# Patient Record
Sex: Male | Born: 1990 | Race: Black or African American | Hispanic: No | Smoking: Current every day smoker
Health system: Southern US, Community
[De-identification: ages and names within clinical notes are randomized; demographics above are authoritative.]

## PROBLEM LIST (undated history)

## (undated) DIAGNOSIS — F32A Depression, unspecified: Secondary | ICD-10-CM

## (undated) DIAGNOSIS — F900 Attention-deficit hyperactivity disorder, predominantly inattentive type: Secondary | ICD-10-CM

## (undated) DIAGNOSIS — F17200 Nicotine dependence, unspecified, uncomplicated: Secondary | ICD-10-CM

## (undated) DIAGNOSIS — F419 Anxiety disorder, unspecified: Secondary | ICD-10-CM

## (undated) DIAGNOSIS — F3181 Bipolar II disorder: Secondary | ICD-10-CM

## (undated) HISTORY — DX: Depression, unspecified: F32.A

## (undated) HISTORY — DX: Anxiety disorder, unspecified: F41.9

## (undated) HISTORY — DX: Attention-deficit hyperactivity disorder, predominantly inattentive type: F90.0

## (undated) HISTORY — PX: WISDOM TOOTH EXTRACTION: SHX21

## (undated) HISTORY — DX: Nicotine dependence, unspecified, uncomplicated: F17.200

---

## 2016-04-03 ENCOUNTER — Emergency Department
Admission: EM | Admit: 2016-04-03 | Discharge: 2016-04-03 | Disposition: A | Discharge status: Home / Self Care | Payer: PRIVATE HEALTH INSURANCE | Attending: Emergency Medicine | Admitting: Emergency Medicine

## 2016-04-03 ENCOUNTER — Telehealth (INDEPENDENT_AMBULATORY_CARE_PROVIDER_SITE_OTHER): Payer: PRIVATE HEALTH INSURANCE | Admitting: Psychiatric - Mental Health Nurse Practitioner (Across the Lifespan)

## 2016-04-03 ENCOUNTER — Emergency Department: Payer: PRIVATE HEALTH INSURANCE

## 2016-04-03 DIAGNOSIS — F418 Other specified anxiety disorders: Secondary | ICD-10-CM

## 2016-04-03 DIAGNOSIS — F419 Anxiety disorder, unspecified: Secondary | ICD-10-CM | POA: Insufficient documentation

## 2016-04-03 LAB — MAGNESIUM: Magnesium: 2.1 mg/dL (ref 1.6–2.6)

## 2016-04-03 LAB — COMPREHENSIVE METABOLIC PANEL
ALT: 13 U/L (ref 0–55)
AST (SGOT): 16 U/L (ref 5–34)
Albumin/Globulin Ratio: 1.4 (ref 0.9–2.2)
Albumin: 4.2 g/dL (ref 3.5–5.0)
Alkaline Phosphatase: 54 U/L (ref 38–106)
Anion Gap: 12 (ref 5.0–15.0)
BUN: 10.1 mg/dL (ref 9.0–28.0)
Bilirubin, Total: 1 mg/dL (ref 0.2–1.2)
CO2: 25 mEq/L (ref 22–29)
Calcium: 9.6 mg/dL (ref 8.5–10.5)
Chloride: 106 mEq/L (ref 100–111)
Creatinine: 1.2 mg/dL (ref 0.7–1.3)
Globulin: 3 g/dL (ref 2.0–3.6)
Glucose: 83 mg/dL (ref 70–100)
Potassium: 3.9 mEq/L (ref 3.5–5.1)
Protein, Total: 7.2 g/dL (ref 6.0–8.3)
Sodium: 143 mEq/L (ref 136–145)

## 2016-04-03 LAB — RAPID DRUG SCREEN, URINE
Barbiturate Screen, UR: NEGATIVE
Benzodiazepine Screen, UR: NEGATIVE
Cannabinoid Screen, UR: NEGATIVE
Cocaine, UR: NEGATIVE
Opiate Screen, UR: NEGATIVE
PCP Screen, UR: NEGATIVE
Urine Amphetamine Screen: NEGATIVE

## 2016-04-03 LAB — SALICYLATE LEVEL: Salicylate Level: <5 mg/dL — ABNORMAL LOW (ref 15.0–30.0)

## 2016-04-03 LAB — CBC
Absolute NRBC: 0 10*3/uL
Hematocrit: 46.9 % (ref 42.0–52.0)
Hgb: 16.2 g/dL (ref 13.0–17.0)
MCH: 28.1 pg (ref 28.0–32.0)
MCHC: 34.5 g/dL (ref 32.0–36.0)
MCV: 81.4 fL (ref 80.0–100.0)
MPV: 9.9 fL (ref 9.4–12.3)
Nucleated RBC: 0 /100 WBC (ref 0.0–1.0)
Platelets: 241 10*3/uL (ref 140–400)
RBC: 5.76 10*6/uL (ref 4.70–6.00)
RDW: 13 % (ref 12–15)
WBC: 10.21 10*3/uL (ref 3.50–10.80)

## 2016-04-03 LAB — ETHANOL: Alcohol: NOT DETECTED mg/dL

## 2016-04-03 LAB — ACETAMINOPHEN LEVEL: Acetaminophen Level: <7 ug/mL — ABNORMAL LOW (ref 10–30)

## 2016-04-03 LAB — TSH: TSH: 0.49 u[IU]/mL (ref 0.35–4.94)

## 2016-04-03 LAB — PHOSPHORUS: Phosphorus: 2.5 mg/dL (ref 2.3–4.7)

## 2016-04-03 LAB — GFR: EGFR: >60

## 2016-04-03 MED ORDER — SODIUM CHLORIDE 0.9 % IV BOLUS
1000.0000 mL | Freq: Once | INTRAVENOUS | Status: AC
Start: 2016-04-03 — End: 2016-04-03
  Administered 2016-04-03: 1000 mL via INTRAVENOUS

## 2016-04-03 MED ORDER — ONDANSETRON HCL 4 MG/2ML IJ SOLN
4.0000 mg | Freq: Once | INTRAMUSCULAR | Status: AC
Start: 2016-04-03 — End: 2016-04-03
  Administered 2016-04-03: 4 mg via INTRAVENOUS
  Filled 2016-04-03: qty 2

## 2016-04-03 NOTE — ED Provider Notes (Signed)
Physician/Midlevel provider first contact with patient: 04/03/16 1410       EMERGENCY DEPARTMENT HISTORY AND PHYSICAL EXAM    Physician/Midlevel provider first contact with patient: 04/03/2016 2:10 PM     Date Time: 04/03/2016 2:10 PM  Patient Name: [facility]    History of Presenting Illness:     Chief Complaint: Anxiety attacks  History obtained from: Patient.  Onset/Duration: 7-8 days  Severity: Moderate  Aggravating Factors: Denies any recent stress  Alleviating Factors: States was placed on Lexapro about a week ago by his psychiatrist for depression  Associated Symptoms: Low appetite, nausea  Narrative/Additional Historical Findings: Billy Hunter is a 25 y.o. male  history of anxiety and depression.  States has been having anxiety attacks for the past week, that have been stronger and lasting longer than before.  Patient denies any suicide or homicide ideation.  Patient is interested in hospitalization for these anxiety attacks.     Nursing notes from this date of service were reviewed.    Past Medical History:   History reviewed. No pertinent past medical history.    Past Surgical History:   History reviewed. No pertinent past surgical history.    Family History:   No family history on file.    Social History:     Social History     Social History   . Marital Status: Single     Spouse Name: N/A   . Number of Children: N/A   . Years of Education: N/A     Social History Main Topics   . Smoking status: Never Smoker    . Smokeless tobacco: Not on file   . Alcohol Use: No   . Drug Use: Not on file   . Sexual Activity: Not on file     Other Topics Concern   . Not on file     Social History Narrative   . No narrative on file       Allergies:   No Known Allergies    Medications:     Current facility-administered medications:   .  ondansetron (ZOFRAN) injection 4 mg, 4 mg, Intravenous, Once, Kym Scannell M, [place]  .  sodium chloride 0.9 % bolus 1,000 mL, 1,000 mL, Intravenous, Once, Cassandria Santee [place],  [place]    Current outpatient prescriptions:   .  escitalopram (LEXAPRO) 10 MG tablet, Take 10 mg by mouth daily., Disp: , Rfl:     Review of Systems:   Constitutional: No fever.  Eyes: No vision changes.  ENT: No sore throat  Cardiovascular: no chest pain  Respiratory: No cough.  GI: No vomiting or diarrhea.  Genitourinary: no dysuria  Musculoskeletal: No extremity pain or decreased use  Skin: no rash or skin lesions.  Neurologic: Normal level of alertness  Psychiatric:  No anxiety Now    Physical Exam:   BP 128/80 mmHg  Pulse 70  Temp(Src) 98.7 F (37.1 C) (Oral)  Resp 14  Ht 5\' 7"  (1.702 m)  Wt 63.504 kg  BMI 21.92 kg/m2  SpO2 98%    Constitutional: Vital signs reviewed. Well hydrated, appears in NAD  Head:  Normocephalic, atraumatic  Eyes: No conjunctival injection. No discharge.  ENT: Mucous membranes dry  Neck: Normal range of motion. Non-tender.  Respiratory/Chest: Clear to auscultation. No respiratory distress.   Cardiovascular: Regular rate and rhythm. No murmur.   Abdomen: Soft and non-tender. No masses or hepatosplenomegaly.  Genitourinary:  UpperExtremity: No edema or cyanosis.  Moving well.  LowerExtremity: No edema or cyanosis.  Moving well.  Neurological: No focal motor deficits by observation. Speech normal. Memory normal.  Skin: Warm and dry. No rash.  Lymphatic: No cervical lymphadenopathy.  Psychiatric: Normal affect. Normal concentration. Interaction with adults is appropriate for age.    Labs:     Results     Procedure Component Value Units Date/Time    TSH [[id number]] Collected:  04/03/16 1430    Specimen Information:  Blood Updated:  04/03/16 1523     Thyroid Stimulating Hormone 0.49 uIU/mL     ASA  level [[id number]]  (Abnormal) Collected:  04/03/16 1430    Specimen Information:  Blood Updated:  04/03/16 1504     Salicylate Level <5.0 (L) mg/dL     GFR [[id number]] Collected:  04/03/16 1430     EGFR >60.0 Updated:  04/03/16 1504    Comprehensive metabolic panel (CMP) [[id number]] Collected:   04/03/16 1430    Specimen Information:  Blood Updated:  04/03/16 1504     Glucose 83 mg/dL      BUN 62.9 mg/dL      Creatinine 1.2 mg/dL      Sodium 528 mEq/L      Potassium 3.9 mEq/L      Chloride 106 mEq/L      CO2 25 mEq/L      Calcium 9.6 mg/dL      Protein, Total 7.2 g/dL      Albumin 4.2 g/dL      AST (SGOT) 16 U/L      ALT 13 U/L      Alkaline Phosphatase 54 U/L      Bilirubin, Total 1.0 mg/dL      Globulin 3.0 g/dL      Albumin/Globulin Ratio 1.4      Anion Gap 12.0     Magnesium [[id number]] Collected:  04/03/16 1430    Specimen Information:  Blood Updated:  04/03/16 1504     Magnesium 2.1 mg/dL     Phosphorus [[id number]] Collected:  04/03/16 1430    Specimen Information:  Blood Updated:  04/03/16 1504     Phosphorus 2.5 mg/dL     Alcohol (Ethanol)  Level [[id number]] Collected:  04/03/16 1430    Specimen Information:  Blood Updated:  04/03/16 1504     Alcohol None Detected mg/dL     Acetaminophen level [[id number]]  (Abnormal) Collected:  04/03/16 1430    Specimen Information:  Blood Updated:  04/03/16 1504     Acetaminophen Level <7 (L) ug/mL     Urine Rapid Drug Screen [[id number]] Collected:  04/03/16 1430    Specimen Information:  Urine Updated:  04/03/16 1457     Amphetamine Screen, UR Negative      Barbiturate Screen, UR Negative      Benzodiazepine Screen, UR Negative      Cannabinoid Screen, UR Negative      Cocaine, UR Negative      Opiate Screen, UR Negative      PCP Screen, UR Negative     CBC without differential [[id number]] Collected:  04/03/16 1430    Specimen Information:  Blood from Blood Updated:  04/03/16 1437     WBC 10.21 x10 3/uL      Hgb 16.2 g/dL      Hematocrit 09.3 %      Platelets 241 x10 3/uL      RBC 5.76 x10 6/uL      MCV 81.4 fL      MCH 28.1 pg      MCHC 34.5 g/dL  RDW 13 %      MPV 9.9 fL      Nucleated RBC 0.0 /100 WBC      Absolute NRBC 0.00 x10 3/uL             Rads:     Radiology Results (24 Hour)     ** No results found for the last 24 hours. **          MDM and ED  Course   I, Lenor Derrick PA-C, have been the primary provider for Billy Hunter during this Emergency Dept visit.  Oxygen saturation by pulse oximetry is 95%-100%, Normal.  Interventions: None Needed.  The attending signature signifies review and agreement of the history, physical examination, evaluation, clinical impression, and plan except as noted.     DDX  Anxiety, depression, suicide or homicide ideation  Patient seen by [facility], recommended partial hospitalization program for [facility] patients. Patient comfortable with plan. Patient declined any medications. Work note given. Patient stable for discharge, no anxiety attack here.     Assessment/Plan:   Results and instructions reviewed at the bedside with patient.    Clinical Impression  Final diagnoses:   Anxiety       Disposition  ED Disposition     Discharge Billy Hunter discharge to home/self care.    Condition at disposition: Stable            Prescriptions  Discharge Medication List as of 04/03/2016  4:12 PM          Signed by: Otto Herb, PA-C            Lenor Derrick [place], [place]  04/03/16 1940    Leticia Clas, MD  04/04/16 [phone]

## 2016-04-03 NOTE — Progress Notes (Signed)
Psychiatric Evaluation Part I    Billy Hunter is a 25 y.o. male admitted to the  [facility] Emergency Department who was seen via Telepsych on 04/03/2016 by Lamount Cohen, NP.    Call Details  Patient Location: [place]  Time contacted by ED Physician: 1420  Time consult began: 1425  Time (in minutes) from Call to Consult: 5  Time consult concluded: 1445  Referring ED Department  Emergency Department: [facility]        Discharge Planning  Living Arrangements:  (roomates)  Support Systems: Friends/neighbors  Type of Residence: Private residence  Patient expects to be discharged to:: to be determined     Presenting Mental Status  Orientation Level: Oriented X4  Memory: No Impairment  Thought Content: normal  Thought Process: normal  Behavior: normal  Consciousness: Alert  Impulse Control: normal  Perception: normal  Eye Contact: normal  Attitude: cooperative  Mood: normal  Hopelessness Affects Goals: Yes  Hopelessness About Future: Yes  Affect: normal  Speech: normal  Concentration: normal  Insight: good  Judgment: good  Appearance: normal  Appetite: decreased  Weight change?: decreased  Weight Loss (Pounds): unk  Energy: decreased  Sleep: hypersomnia  Reliability of Reporter/Patient: good    Tool for Assessment of Suicide Risk  Individual Risk Profile: male, age 29-30, chronic mental illness  Symptom Risk Profile: anxiety, depressive symptoms  Protective Factors: employed, commitment to occupation, internalized teachings against suicide, trait optimism, safety plan, abstinent from substances  Level of Suicide Risk: Low    Within the Last 6 Months:: no history of violence toward self  Greater than 6 Months Ago:: history of violence toward self (as a kid in HS punched a wall )                    Substance Recovery Support  Recovery Resources/Support: occasional ETOH use                    Violence Toward Others  Within the Last 6 Months:: no history of violence toward others  Greater than 6 Months Ago:: no  history of violence toward others              Summary: 25 year old male presents to ED reports panic attacks x1 wk reports last 1.5 hrs daily, ends with him vomiting. Reports unable to handle anxiety attacks that he feels like he is unable to get a grip. Discussed re centering himself when panic attacks occur by touch- touching surroundings , and try walking, taking a shower, hobbies- discussed hobbies.    Hobbies-reports none other than working and sleeping.   Arrival- had roommate dropped him off   Stressors- Last relationship 1 month ago got out of a month relationship prior single for 1 year  Medical hx- denies     Psych hx - reports suffers from depression and anxiety past 4 to 5 yrs recently prescribed Lexapro 10 mg for the past week, reports anxiety became worse past week. Seen by Dr. Ardyth Harps psychiatrists x1 at [place]. Denies past attempts or SIB. Reports SI in the past, denies plan.       ETOH- occasional 2 to 3 drinks per week, perhaps a beer or shot of liquor. Denies addiction hx. Denies withdrawal sxs. Reports blacked out 3 yrs ago, had higher drinking habit, was drinking anything going out with friends was 21 .  THC, occasional  Caffeine- reports consumes seldom,  Coffee usually gets him wired, and other  times makes him sleep.  Trauma hx- denies  Social hx- born in [place] and raised in [place]. Parents divorced at age 69.his mom worked for the [employer], pts dad worked for Nurse, children's.Pt has 1 sister. Highest level of education some college. Reports growing up he wasn't the best student and wasn't the best at making friends. 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  Reports does not have a big social circle.     Disposition:  reviewed options pt declined admission. Pt not detainable. 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 Coping skills sheets faxed over.                  Lamount Cohen, NP    [facility]  [address]  [place], [place] [address]  [phone]

## 2016-04-03 NOTE — Discharge Instructions (Signed)
Anxiety, Panic     You have been diagnosed with an anxiety attack.     You seem to have had an anxiety attack. There are many conditions that can cause symptoms like these. If this is the first time this has happened, follow-up with your regular doctor. You may need more testing to be sure there isn’t another cause for your symptoms.     Anxiety causes very strong feelings of worry and fear. It may also cause chest pain or shortness of breath. You may feel like you have palpitations (a racing heart). You might feel numbness (like parts of your body are "asleep"), especially around the mouth and in the hands or feet.     Follow up with your counselor and family doctor. If you do not have an appointment in the next 2-3 days, call and make one. It is VERY IMPORTANT for your counselor and family doctor to know if you get worse.     YOU SHOULD SEEK MEDICAL ATTENTION IMMEDIATELY, EITHER HERE OR AT THE NEAREST EMERGENCY DEPARTMENT, IF ANY OF THE FOLLOWING OCCURS:  · You have symptoms that you normally don’t have with your anxiety attacks.  · You think of harming yourself (suicidal thoughts) or harming someone else.  · You have symptoms you normally don’t have and they last longer than normal or your medicine doesn't help. These include chest pain, passing out, feeling that your heart is racing or shortness of breath.  · You have a fever (temperature higher than 100.4ºF / 38ºC).

## 2016-04-03 NOTE — ED Notes (Signed)
Patient reports anxiety attacks which he has been having every morning for the past week. States he has had no new stressors in his life, does have hx of anxiety/depression. Reports he wakes up in the morning and has panic attacks lasting >30 minutes at times.  Denies SI/HI Patient states he was recently put on Lexapro for anxiety/depression. States he was having panic attacks prior to starting lexapro, but reports these have increased.     Patient has been questioned about the presence of weapons so that these items may be recovered and secured appropriately. Denies the current possession of any weapons.

## 2016-10-17 ENCOUNTER — Encounter (INDEPENDENT_AMBULATORY_CARE_PROVIDER_SITE_OTHER): Payer: Self-pay | Admitting: Family

## 2016-10-17 ENCOUNTER — Ambulatory Visit (INDEPENDENT_AMBULATORY_CARE_PROVIDER_SITE_OTHER): Payer: Commercial Managed Care - POS | Admitting: Family

## 2016-10-17 VITALS — BP 120/77 | HR 83 | Temp 99.3°F | Resp 20 | Ht 67.0 in | Wt 153.0 lb

## 2016-10-17 DIAGNOSIS — R52 Pain, unspecified: Secondary | ICD-10-CM

## 2016-10-17 DIAGNOSIS — B349 Viral infection, unspecified: Secondary | ICD-10-CM

## 2016-10-17 DIAGNOSIS — R110 Nausea: Secondary | ICD-10-CM

## 2016-10-17 LAB — POCT INFLUENZA A/B
POCT Rapid Influenza A AG: NEGATIVE
POCT Rapid Influenza B AG: NEGATIVE

## 2016-10-17 MED ORDER — ONDANSETRON 4 MG PO TBDP
4.0000 mg | ORAL_TABLET | Freq: Three times a day (TID) | ORAL | 0 refills | Status: DC | PRN
Start: 2016-10-17 — End: 2017-08-01

## 2016-10-17 NOTE — Patient Instructions (Signed)
Viral Syndrome (Adult)  A viral illness may cause a number of symptoms. The symptoms depend on the part of the body that the virus affects. If it settles in your nose, throat, and lungs, it may cause cough, sore throat, congestion, and sometimes headache. If it settles in your stomach and intestinal tract, it may cause vomiting and diarrhea. Sometimes it causes vague symptoms like "aching all over," feeling tired, loss of appetite, or fever.  A viral illness usually lasts 1 to 2 weeks, but sometimes it lasts longer. In some cases, a more serious infection can look like a viral syndrome in the first few days of the illness. You may need another exam and additional tests to know the difference. Watch for the warning signs listed below.  Home care  Follow these guidelines for taking care of yourself at home:   If symptoms are severe, rest at home for the first 2 to 3 days.   Stay away from cigarette smoke - both your smoke and the smoke from others.   You may use over-the-counteracetaminophen or ibuprofen for fever, muscle aching, and headache, unless another medicine was prescribed for this. If you have chronic liver or kidney disease or ever had a stomach ulcer or GI bleeding, talk with your doctor before using these medicines. No one who is younger than 18 and ill with a fever should take aspirin. It may cause severe disease or death.   Your appetite may be poor, so a light diet is fine. Avoid dehydration by drinking 8 to 12 8-ounce glasses of fluids each day. This may include water; orange juice; lemonade; apple, grape, and cranberry juice; clear fruit drinks; electrolyte replacement and sports drinks; and decaffeinated teas and coffee. If you have been diagnosed with a kidney disease, ask your doctor how much and what types of fluids you should drink to prevent dehydration. If you have kidney disease, drinking too much fluid can cause it build up in the your body and be dangerous to your  health.   Over-the-counter remedies won't shorten the length of the illness but may be helpful for cough, sore throat; and nasal and sinus congestion. Don't use decongestants if you have high blood pressure.  Follow-up care  Follow up with your healthcare provider if you do not improve over the next week.  Call 911  Get emergency medical care if any of the following occur:   Convulsion   Feeling weak, dizzy, or like you are going to faint   Chest pain, shortness of breath, wheezing, or difficulty breathing  When to seek medical advice  Call your healthcare provider right away if any of these occur:   Cough with lots of colored sputum (mucus) or blood in your sputum   Chest pain, shortness of breath, wheezing, or difficulty breathing   Severe headache; face, neck, or ear pain   Severe, constant pain in the lower right side of your belly (abdominal)   Continued vomiting (can't keep liquids down)   Frequent diarrhea (more than 5 times a day); blood (red or black color) or mucus in diarrhea   Feeling weak, dizzy, or like you are going to faint   Extreme thirst   Fever of 100.4F (38C) or higher, or as directed by your healthcare provider  Date Last Reviewed: 05/30/2014   2000-2016 [employer] [address], [place] [address]. All rights reserved. This information is not intended as a substitute for professional medical care. Always follow your healthcare   professional's instructions.

## 2016-10-17 NOTE — Progress Notes (Signed)
[facility]    PROGRESS NOTE      Patient: Billy Hunter   Date: 10/17/2016   MRN: [id number]     History reviewed. No pertinent past medical history.  Social History     Social History   . Marital status: Single     Spouse name: N/A   . Number of children: N/A   . Years of education: N/A     Occupational History   . Not on file.     Social History Main Topics   . Smoking status: Never Smoker   . Smokeless tobacco: Never Used   . Alcohol use No   . Drug use: No   . Sexual activity: Not on file     Other Topics Concern   . Not on file     Social History Narrative   . No narrative on file     History reviewed. No pertinent family history.    ASSESSMENT/PLAN     Billy Hunter is a 26 y.o. male    Chief Complaint   Patient presents with   . URI     body aches, headaches since saturday. nauseated        1. Viral illness    2. Body aches  - POCT Influenza A/B    3. Nausea  - ondansetron (ZOFRAN ODT) 4 MG disintegrating tablet; Take 1 tablet (4 mg total) by mouth every 8 (eight) hours as needed for Nausea.  Dispense: 12 tablet; Refill: 0         Results for orders placed or performed in visit on 10/17/16   POCT Influenza A/B   Result Value Ref Range    POCT QC Pass     POCT Rapid Influenza A AG Negative Negative    POCT Rapid Influenza B AG Negative Negative     It is likely that your symptoms are of viral nature at this time. Your congestion can best be helped by OTC decongestants such as sudafed to help open up nasal passageways and minimize congestion and secretions. Start OTC cough suppressant such as delsym or robitussin to decrease cough symptoms. Increase vitamin intake for immune support. If feeling nauseous, my try zofran, usage and side effects reviewed with pt. Increase your fluid intake with water or electrolyte beverages (gatorade, pedialyte). The ingestion of warm liquids may help loosen respiratory secretions, thereby enhancing their removal. May start saline nasal spray or nasal drops  may be used to cleanse the sinuses if desired. A humidfier or warm shower may help losen secretions. Practice good hand hygiene. Get plenty of rest. Symptoms may last for another 7 -10 days, and may get worse before they get better; pt was advised of this and verbalized understanding. RTC if symptoms worsening or persist. Patient is agreeable to plan, has no further questions at this time.    MEDICATIONS     Current Outpatient Prescriptions   Medication Sig Dispense Refill   . buPROPion (WELLBUTRIN) 100 MG tablet Take 100 mg by mouth 2 (two) times daily.     [name] gabapentin (NEURONTIN) 300 MG capsule Take 200 mg by mouth 3 (three) times daily.     . ondansetron (ZOFRAN ODT) 4 MG disintegrating tablet Take 1 tablet (4 mg total) by mouth every 8 (eight) hours as needed for Nausea. 12 tablet 0     No current facility-administered medications for this visit.        No Known Allergies    SUBJECTIVE  Chief Complaint   Patient presents with   . URI     body aches, headaches since saturday. nauseated        URI    This is a new problem. Episode onset: 2-3 days. The problem has been gradually worsening. Maximum temperature: low grade, feeling on and off. Associated symptoms include congestion (nasal), coughing (wet  in the morning, mild infrequent), headaches (frontal, wraps around his head, started saturday evening) and nausea (started on saturday, on and off). Pertinent negatives include no abdominal pain, diarrhea, ear pain, neck pain, plugged ear sensation, rash, rhinorrhea, sinus pain, sneezing, sore throat, swollen glands, vomiting or wheezing. Associated symptoms comments: Body aches. Treatments tried: dayquil yesterday. The treatment provided no relief.   hx of severe depression and states it is hard to differentiate when he is sick or when it's due to depression.     ROS     Review of Systems   Constitutional: Positive for diaphoresis (woke up sunday ) and fever (99.2 max temp). Negative for appetite change, chills  and fatigue.   HENT: Positive for congestion (nasal) and sinus pressure (fronatal, and maxillary). Negative for ear pain, postnasal drip, rhinorrhea, sinus pain, sneezing, sore throat, tinnitus, trouble swallowing and voice change.    Eyes: Negative.    Respiratory: Positive for cough (wet  in the morning, mild infrequent). Negative for apnea, chest tightness, shortness of breath and wheezing.    Cardiovascular: Negative.    Gastrointestinal: Positive for constipation (BM daily, but hard ) and nausea (started on saturday, on and off). Negative for abdominal pain, diarrhea and vomiting.   Musculoskeletal: Positive for myalgias. Negative for neck pain and neck stiffness.   Skin: Negative for rash.   Neurological: Positive for headaches (frontal, wraps around his head, started saturday evening). Negative for dizziness, tremors, facial asymmetry, weakness and light-headedness.       The following portions of the patient's history were reviewed and updated as appropriate: Allergies, Current Medications, Past Family History, Past Medical history, Past social history, Past surgical history, and Problem List.    PHYSICAL EXAM     Vitals:    10/17/16 1603   BP: 120/77   BP Site: Left arm   Patient Position: Sitting   Cuff Size: Medium   Pulse: 83   Resp: 20   Temp: 99.3 F (37.4 C)   TempSrc: Tympanic   SpO2: 98%   Weight: 69.4 kg (153 lb)   Height: 1.702 m (5\' 7" )       Physical Exam   Constitutional: He is oriented to person, place, and time. He appears well-developed and well-nourished. No distress.   HENT:   Head: Normocephalic and atraumatic.   Right Ear: External ear normal.   Left Ear: External ear normal.   Nose: Nose normal.   Mouth/Throat: Oropharynx is clear and moist. No oropharyngeal exudate.   bilat TM normal   Eyes: Conjunctivae and EOM are normal. Pupils are equal, round, and reactive to light. Right eye exhibits no discharge. Left eye exhibits no discharge. No scleral icterus.   Neck: Normal range of  motion. Neck supple.   Cardiovascular: Normal rate, regular rhythm, normal heart sounds and intact distal pulses.  Exam reveals no gallop and no friction rub.    No murmur heard.  Pulmonary/Chest: Effort normal and breath sounds normal. No respiratory distress. He has no wheezes. He has no rales. He exhibits no tenderness.   Good air exchange in all fields   Lymphadenopathy:  He has no cervical adenopathy.   Neurological: He is alert and oriented to person, place, and time.   Skin: Skin is warm and dry. No rash noted. He is not diaphoretic. No erythema. No pallor.   Psychiatric: He has a normal mood and affect. His behavior is normal.     Ortho Exam  Neurologic Exam     Mental Status   Oriented to person, place, and time.     Cranial Nerves     CN III, IV, VI   Pupils are equal, round, and reactive to light.  Extraocular motions are normal.       PROCEDURE(S)     Procedures        Signed,  Ronaldo Miyamoto, FNP  10/17/2016

## 2016-10-18 ENCOUNTER — Encounter (INDEPENDENT_AMBULATORY_CARE_PROVIDER_SITE_OTHER): Payer: Self-pay | Admitting: Family

## 2016-12-29 ENCOUNTER — Ambulatory Visit (INDEPENDENT_AMBULATORY_CARE_PROVIDER_SITE_OTHER): Payer: Commercial Managed Care - POS | Admitting: Family

## 2016-12-29 ENCOUNTER — Encounter (INDEPENDENT_AMBULATORY_CARE_PROVIDER_SITE_OTHER): Payer: Self-pay | Admitting: Family

## 2016-12-29 VITALS — BP 125/82 | HR 81 | Temp 98.3°F | Resp 16 | Wt 155.0 lb

## 2016-12-29 DIAGNOSIS — R0981 Nasal congestion: Secondary | ICD-10-CM

## 2016-12-29 DIAGNOSIS — R0982 Postnasal drip: Secondary | ICD-10-CM

## 2016-12-29 DIAGNOSIS — J0100 Acute maxillary sinusitis, unspecified: Secondary | ICD-10-CM

## 2016-12-29 MED ORDER — AMOXICILLIN 500 MG PO TABS
500.0000 mg | ORAL_TABLET | Freq: Two times a day (BID) | ORAL | 0 refills | Status: AC
Start: 2016-12-29 — End: 2017-01-08

## 2016-12-29 MED ORDER — FLUTICASONE PROPIONATE 50 MCG/ACT NA SUSP
1.0000 | Freq: Two times a day (BID) | NASAL | 0 refills | Status: DC
Start: 2016-12-29 — End: 2017-08-24

## 2016-12-29 NOTE — Patient Instructions (Signed)
Acute Bacterial Rhinosinusitis (ABRS)    Acute bacterial rhinosinusitis (ABRS) is an infection of your nasal cavity and sinuses. It's caused by bacteria. Acute means that you've had symptoms for less than 4 weeks, but possibly up to 12 weeks.  Understanding your sinuses  The nasal cavity is the large air-filled space behind your nose. The sinuses are a group of spaces formed by the bones of your face. They connect with your nasal cavity. ABRS causes the tissue lining these spaces to become inflamed. Mucus may not drain normally. This leads to facial pain and other symptoms.  What causes ABRS?  ABRS most often follows an upper respiratory infection caused by a virus. Bacteria then infect the lining of your nasal cavity and sinuses. But you can also get ABRS if you have:   Nasal allergies   Long-term nasal swelling and congestion not caused by allergies   Blockage in the nose  Symptoms of ABRS  The symptoms of ABRS may be different for each person and include:   Nasal congestion or blockage   Pain or pressure in the face   Thick, colored drainage from the nose  Other symptoms may include:   Runny nose   Fluid draining from the nose down the throat (postnasal drip)   Headache   Cough   Pain   Fever  Diagnosing ABRS  ABRS may be diagnosed if you've had an upper respiratory infection like a cold and cough for 10 or more days without improvement or with worsening symptoms. Your healthcare provider will ask about your symptoms and your medical history. The provider will check your vital signs, including your temperature. You'll have a physical exam. The healthcare provider will check your ears, nose, and throat. You likely won't need any tests. If ABRS comes back, you may have a culture or other tests.  Treatment for ABRS  Treatment may include:   Antibiotic medicine. This is for symptoms that last for at least 10 to 14 days.   Nasal corticosteroid medicine. Drops or spray used in the nose can lessen  swelling and congestion.   Over-the-counter pain medicine. This is to lessen sinus pain and pressure.   Nasal decongestant medicine. Spray or drops may help to lessen congestion. Do not use them for more than a few days.   Salt wash (saline irrigation). This can help to loosen mucus.  Possible complications of ABRS  ABRS may come back or become long-term (chronic). In rare cases, ABRS may cause complications such as:   Inflamed tissue around the brain and spinal cord (meningitis)   Inflamed tissue around the eyes (orbital cellulitis)   Inflamed bones around the sinuses (osteitis)  These problems may need to be treated in a hospital with intravenous (IV) antibiotic medicine or surgery.  When to call the healthcare provider  Call your healthcare provider if you have any of the following:   Symptoms that don't get better, or get worse   Symptoms that don't get better after 3 to 5 days on antibiotics   Trouble seeing   Swelling around your eyes   Confusion or trouble staying awake   Date Last Reviewed: 01/04/2016   2000-2017 [employer], [place]. [address] [place], [place] [address]. All rights reserved. This information is not intended as a substitute for professional medical care. Always follow your healthcare professional's instructions.      Amoxicillin Trihydrate Oral capsule  What is this medicine?  AMOXICILLIN (a mox i SIL in) is a  penicillin antibiotic. It is used to treat certain kinds of bacterial infections. It will not work for colds, flu, or other viral infections.  This medicine may be used for other purposes; ask your health care provider or pharmacist if you have questions.  What should I tell my health care provider before I take this medicine?  They need to know if you have any of these conditions:   asthma   kidney disease   an unusual or allergic reaction to amoxicillin, other penicillins, cephalosporin antibiotics, other medicines, foods, dyes, or preservatives   pregnant or  trying to get pregnant   breast-feeding  How should I use this medicine?  Take this medicine by mouth with a glass of water. Follow the directions on your prescription label. You may take this medicine with food or on an empty stomach. Take your medicine at regular intervals. Do not take your medicine more often than directed. Take all of your medicine as directed even if you think your are better. Do not skip doses or stop your medicine early.  Talk to your pediatrician regarding the use of this medicine in children. While this drug may be prescribed for selected conditions, precautions do apply.  Overdosage: If you think you have taken too much of this medicine contact a poison control center or emergency room at once.  NOTE: This medicine is only for you. Do not share this medicine with others.  What if I miss a dose?  If you miss a dose, take it as soon as you can. If it is almost time for your next dose, take only that dose. Do not take double or extra doses.  What may interact with this medicine?   amiloride   birth control pills   chloramphenicol   macrolides   probenecid   sulfonamides   tetracyclines  This list may not describe all possible interactions. Give your health care provider a list of all the medicines, herbs, non-prescription drugs, or dietary supplements you use. Also tell them if you smoke, drink alcohol, or use illegal drugs. Some items may interact with your medicine.  What should I watch for while using this medicine?  Tell your doctor or health care professional if your symptoms do not improve in 2 or 3 days. Take all of the doses of your medicine as directed. Do not skip doses or stop your medicine early.  If you are diabetic, you may get a false positive result for sugar in your urine with certain brands of urine tests. Check with your doctor.  Do not treat diarrhea with over-the-counter products. Contact your doctor if you have diarrhea that lasts more than 2 days or if the  diarrhea is severe and watery.  What side effects may I notice from receiving this medicine?  Side effects that you should report to your doctor or health care professional as soon as possible:   allergic reactions like skin rash, itching or hives, swelling of the face, lips, or tongue   breathing problems   dark urine   redness, blistering, peeling or loosening of the skin, including inside the mouth   seizures   severe or watery diarrhea   trouble passing urine or change in the amount of urine   unusual bleeding or bruising   unusually weak or tired   yellowing of the eyes or skin  Side effects that usually do not require medical attention (report to your doctor or health care professional if they continue or are bothersome):  dizziness   headache   stomach upset   trouble sleeping  This list may not describe all possible side effects. Call your doctor for medical advice about side effects. You may report side effects to FDA at 1-800-FDA-1088.  Where should I keep my medicine?  Keep out of the reach of children.  Store between 68 and 77 degrees F (20 and 25 degrees C). Keep bottle closed tightly. Throw away any unused medicine after the expiration date.  NOTE:This sheet is a summary. It may not cover all possible information. If you have questions about this medicine, talk to your doctor, pharmacist, or health care provider. Copyright 2015 [employer]

## 2016-12-29 NOTE — Progress Notes (Signed)
[facility]    PROGRESS NOTE      Patient: Billy Hunter   Date: 12/29/2016   MRN: [id number]     History reviewed. No pertinent past medical history.  Social History     Social History   . Marital status: Single     Spouse name: N/A   . Number of children: N/A   . Years of education: N/A     Occupational History   . Not on file.     Social History Main Topics   . Smoking status: Never Smoker   . Smokeless tobacco: Never Used   . Alcohol use No   . Drug use: No   . Sexual activity: Not on file     Other Topics Concern   . Not on file     Social History Narrative   . No narrative on file     History reviewed. No pertinent family history.    ASSESSMENT/PLAN     Billy Hunter is a 26 y.o. male    Chief Complaint   Patient presents with   . URI        1. Acute maxillary sinusitis, recurrence not specified  - amoxicillin (AMOXIL) 500 MG tablet; Take 1 tablet (500 mg total) by mouth 2 (two) times daily.for 10 days  Dispense: 20 tablet; Refill: 0    2. Nasal congestion  - fluticasone (FLONASE) 50 MCG/ACT nasal spray; 1 spray by Nasal route 2 (two) times daily.  Dispense: 16 g; Refill: 0    3. Post-nasal drip         Will start amoxicillin today, which will treat sinus infection, usage and side effects reviewed. Please finish full course of antibiotics and may take probiotics or yogurt to supplement good gut flora. Advised to use OTC flonase and OTC decongestant for the next one week to help open up nasal passageways and minimize congestion and secretions. Maintaining adequate hydration may help to thin secretions and soothe the respiratory mucosa is advised. May start sinus rinse and/or Saline nasal sprays to cleanse the sinuses if desired.  A humidfier or warm shower may help losen secretions.  Tylenol or Ibuprofen for sinus pressure and pain. RTC if symptoms are not improving. Patient is agreeable to plan, has no further questions at this time.       MEDICATIONS     Current Outpatient Prescriptions    Medication Sig Dispense Refill   . buPROPion (WELLBUTRIN) 100 MG tablet Take 100 mg by mouth 2 (two) times daily.     [name] gabapentin (NEURONTIN) 300 MG capsule Take 200 mg by mouth 3 (three) times daily.     [name] amoxicillin (AMOXIL) 500 MG tablet Take 1 tablet (500 mg total) by mouth 2 (two) times daily.for 10 days 20 tablet 0   . fluticasone (FLONASE) 50 MCG/ACT nasal spray 1 spray by Nasal route 2 (two) times daily. 16 g 0   . ondansetron (ZOFRAN ODT) 4 MG disintegrating tablet Take 1 tablet (4 mg total) by mouth every 8 (eight) hours as needed for Nausea. 12 tablet 0     No current facility-administered medications for this visit.        No Known Allergies    SUBJECTIVE     Chief Complaint   Patient presents with   . URI        Patient came in 10/17/16 for c/o flu-like sx at the time of visit, had a negative flu test done at that time.  Pt states all symptoms have resolved since then, these sx are new.      URI    This is a new problem. Episode onset: 2 weeks ago. The problem has been gradually improving (starting to get better within the past two days ). There has been no fever. Associated symptoms include abdominal pain (he reports "maybe a little bit of an upset stomach" but no pain), congestion (nasal and chest), coughing (slight, mostly dry), joint pain (he reports "overall my body feels sore"), nausea (a little bit, on and off), rhinorrhea, sinus pain, a sore throat (resolved) and vomiting (x1 two weeks ago, sx have resolved). Pertinent negatives include no chest pain, diarrhea, ear pain, headaches, joint swelling, neck pain, plugged ear sensation, rash, sneezing, swollen glands or wheezing. Associated symptoms comments: Postnasal drip. He reports "I feel really lethargic, dizzy and loopy, I feel almost intoxicated on Monday at work because of spinning sensation but resolved" . Treatments tried: dayquil Q4HRs x3 days (since Tuesday) and vitamin C . last dose dayquil yesterday at 7pm.       ROS     Review of  Systems   Constitutional: Negative for appetite change, chills, diaphoresis, fatigue and fever.   HENT: Positive for congestion (nasal and chest), postnasal drip, rhinorrhea, sinus pain, sinus pressure (maxillary) and sore throat (resolved). Negative for ear pain, sneezing, tinnitus, trouble swallowing and voice change.    Eyes: Negative.    Respiratory: Positive for cough (slight, mostly dry). Negative for apnea, choking, chest tightness, shortness of breath and wheezing.    Cardiovascular: Negative for chest pain.   Gastrointestinal: Positive for abdominal pain (he reports "maybe a little bit of an upset stomach" but no pain), nausea (a little bit, on and off) and vomiting (x1 two weeks ago, sx have resolved). Negative for diarrhea.   Genitourinary: Negative.    Musculoskeletal: Positive for joint pain (he reports "overall my body feels sore"). Negative for neck pain.   Skin: Negative for rash.   Allergic/Immunologic: Negative for environmental allergies.   Neurological: Negative for dizziness, facial asymmetry, light-headedness and headaches.       The following portions of the patient's history were reviewed and updated as appropriate: Allergies, Current Medications, Past Family History, Past Medical history, Past social history, Past surgical history, and Problem List.    PHYSICAL EXAM     Vitals:    12/29/16 1029 12/29/16 1049   BP: 125/82    Pulse: 81    Resp: 16    Temp: 99.1 F (37.3 C) 98.3 F (36.8 C)   TempSrc:  Oral   SpO2: 98%    Weight: 70.3 kg (155 lb)        Physical Exam   Constitutional: He is oriented to person, place, and time. He appears well-developed and well-nourished. No distress.   HENT:   Head: Normocephalic and atraumatic.   Right Ear: External ear and ear canal normal. Tympanic membrane is erythematous. Tympanic membrane is not retracted and not bulging. Tympanic membrane mobility is normal. A middle ear effusion is present.   Left Ear: External ear and ear canal normal. Tympanic  membrane is erythematous. Tympanic membrane is not retracted and not bulging. Tympanic membrane mobility is normal. A middle ear effusion is present.   Nose: Mucosal edema and rhinorrhea present. Right sinus exhibits maxillary sinus tenderness. Right sinus exhibits no frontal sinus tenderness. Left sinus exhibits maxillary sinus tenderness. Left sinus exhibits no frontal sinus tenderness.   Mouth/Throat: Uvula is midline and  oropharynx is clear and moist. No uvula swelling. No oropharyngeal exudate, posterior oropharyngeal edema, posterior oropharyngeal erythema or tonsillar abscesses. Tonsils are 0 on the right. Tonsils are 0 on the left. No tonsillar exudate.   clear post nasal drip   Eyes: Conjunctivae and EOM are normal. Pupils are equal, round, and reactive to light. Right eye exhibits no discharge. Left eye exhibits no discharge. No scleral icterus.   Neck: Normal range of motion. Neck supple.   Cardiovascular: Normal rate, regular rhythm, normal heart sounds and intact distal pulses.  Exam reveals no gallop and no friction rub.    No murmur heard.  Pulmonary/Chest: Effort normal and breath sounds normal. No respiratory distress. He has no wheezes. He has no rales. He exhibits no tenderness.   Good air exchange in all fields   Lymphadenopathy:     He has no cervical adenopathy.   Neurological: He is alert and oriented to person, place, and time.   Skin: Skin is warm and dry. Capillary refill takes less than 2 seconds. No rash noted. He is not diaphoretic. No erythema. No pallor.   Psychiatric: He has a normal mood and affect. His behavior is normal.     Ortho Exam  Neurologic Exam     Mental Status   Oriented to person, place, and time.     Cranial Nerves     CN III, IV, VI   Pupils are equal, round, and reactive to light.  Extraocular motions are normal.       PROCEDURE(S)     Procedures        Signed,  Ronaldo Miyamoto, FNP  12/29/2016

## 2017-08-01 ENCOUNTER — Encounter (INDEPENDENT_AMBULATORY_CARE_PROVIDER_SITE_OTHER): Payer: Self-pay

## 2017-08-01 ENCOUNTER — Ambulatory Visit (INDEPENDENT_AMBULATORY_CARE_PROVIDER_SITE_OTHER): Payer: BLUE CROSS/BLUE SHIELD | Admitting: Family

## 2017-08-01 VITALS — BP 116/84 | HR 88 | Temp 98.0°F | Resp 14 | Wt 151.0 lb

## 2017-08-01 DIAGNOSIS — Z113 Encounter for screening for infections with a predominantly sexual mode of transmission: Secondary | ICD-10-CM

## 2017-08-01 DIAGNOSIS — R110 Nausea: Secondary | ICD-10-CM

## 2017-08-01 DIAGNOSIS — R197 Diarrhea, unspecified: Secondary | ICD-10-CM

## 2017-08-01 LAB — HIV AG/AB 4TH GENERATION: HIV Ag/Ab, 4th Generation: NONREACTIVE

## 2017-08-01 LAB — HEPATITIS C ANTIBODY: Hepatitis C, AB: NONREACTIVE

## 2017-08-01 LAB — HEPATITIS B SURFACE ANTIGEN W/ REFLEX TO CONFIRMATION: Hepatitis B Surface Antigen: NONREACTIVE

## 2017-08-01 LAB — HEMOLYSIS INDEX: Hemolysis Index: 3 (ref 0–18)

## 2017-08-01 MED ORDER — ONDANSETRON 4 MG PO TBDP
4.0000 mg | ORAL_TABLET | Freq: Three times a day (TID) | ORAL | 0 refills | Status: DC | PRN
Start: 2017-08-01 — End: 2017-09-08

## 2017-08-01 NOTE — Progress Notes (Signed)
[facility]    PROGRESS NOTE      Patient: Billy Hunter   Date: 08/01/2017   MRN: [id number]     History reviewed. No pertinent past medical history.  Social History     Social History   . Marital status: Single     Spouse name: N/A   . Number of children: N/A   . Years of education: N/A     Occupational History   . Not on file.     Social History Main Topics   . Smoking status: Never Smoker   . Smokeless tobacco: Current User   . Alcohol use No   . Drug use: No   . Sexual activity: Not on file     Other Topics Concern   . Not on file     Social History Narrative   . No narrative on file     History reviewed. No pertinent family history.    ASSESSMENT/PLAN     Billy Hunter is a 26 y.o. male    Chief Complaint   Patient presents with   . Diarrhea        1. Nausea  - ondansetron (ZOFRAN ODT) 4 MG disintegrating tablet; Take 1 tablet (4 mg total) by mouth every 8 (eight) hours as needed for Nausea.  Dispense: 20 tablet; Refill: 0  - recommend staying hydrated    2. Diarrhea, unspecified type  - It is very likely your symptoms are due to viral gastroenteritis.  Wash your hands frequently with soap and hot water.  Avoid handling food while you are ill to prevent the spread of the diarrhea.  Avoid coffee, alcohol, dairy products, fatty, and spicy foods.  Hydrate with clear fluids and electrolye drinks at room temperature such as tea, broth, sports drinks, and ginger ale (without caffeine).  Begin eating a bland diet of toast, saltine crackers, apple sauce, and bananas as tolerated.  Advance diet as tolerated.   May use OTC Pepto bismol or loperimide as needed.  Call or follow up with PCP if there are bloody stools, persistent diarrhea, vomiting, fever or abdominal pain.  Patient agreeable with plan. All questions answered.    The differential diagnosis includes viral gastroenteritis, appendicitis, diverticulitis, antibiotic associated diarrhea, IBS, lactose intolerance, medication side effect,  colitis, celiac disease or IBD.      3. Screening examination for STD (sexually transmitted disease)  - HSV 1/2 Type-Specific IgG  - RPR (Reflex to Titer and Confirmation)  - HIV Ag/Ab 4th generation  - Hepatitis B (HBV) Surface Antigen  - Hepatitis C Antibody    Will notify with results and treat as indicated.            Risk & Benefits of the new medication(s) were explained to the patient (and family) who verbalized understanding & agreed to the treatment plan. Patient (family) encouraged to contact me/clinical staff with any questions/concerns      MEDICATIONS     Current Outpatient Prescriptions   Medication Sig Dispense Refill   . ARIPiprazole (ABILIFY) 5 MG tablet Take 2.5 mg by mouth daily.     . fluticasone (FLONASE) 50 MCG/ACT nasal spray 1 spray by Nasal route 2 (two) times daily. 16 g 0   . gabapentin (NEURONTIN) 100 MG capsule Take 400 mg by mouth 3 (three) times daily.         . ondansetron (ZOFRAN ODT) 4 MG disintegrating tablet Take 1 tablet (4 mg total) by mouth every 8 (eight) hours  as needed for Nausea. 20 tablet 0   . buPROPion (WELLBUTRIN) 100 MG tablet Take 100 mg by mouth 2 (two) times daily.       No current facility-administered medications for this visit.        No Known Allergies    SUBJECTIVE     Chief Complaint   Patient presents with   . Diarrhea        Pt reports having nausea and diarrhea started yesterday. He thought he ate bad food and thought it would go away. However he states this AM he felt worse with nausea and is still having diarrhea. He says he is dealing with depression and unsure if symptoms are related.    Has not been feeling well in general for the past week. Has been lightheaded and having migraines more frequently. Migraines are similar to the ones he normally has.     Recently restarted Gabapentin and started Abilify on Tuesday.       Diarrhea    This is a new problem. The current episode started yesterday. Associated symptoms include bloating, chills (this AM after  BM) and headaches (migraine lasted 5 hours then went away after 2 hours. Otherwise headaches go away after an hour). Pertinent negatives include no abdominal pain, coughing, fever, increased  flatus, myalgias or vomiting. Associated symptoms comments: Migraine, lightheaded sometimes, difficult to eat, feels dehydrated. Treatments tried: aleve for the migraine. depression     He also states he has a new partner and would like STD testing done. He denies a previous history of STDs. He states his new partner does not have any symptoms and he denies having symptoms.   ROS     Review of Systems   Constitutional: Positive for appetite change (decreased, no significant change in the past few days), chills (this AM after BM) and fatigue (in the past few days). Negative for fever.   HENT: Negative for congestion, ear pain and sore throat.         Throat feels dry   Respiratory: Positive for shortness of breath (mild, yesterday). Negative for cough.    Gastrointestinal: Positive for bloating, diarrhea (x 2) and nausea. Negative for abdominal pain, blood in stool, flatus and vomiting.        Has sensation of heartburn and nausea at once. Denies hx of reflux.   Genitourinary: Negative for difficulty urinating, discharge, dysuria, flank pain and genital sores.   Musculoskeletal: Negative for myalgias.   Neurological: Positive for headaches (migraine lasted 5 hours then went away after 2 hours. Otherwise headaches go away after an hour).       The following sections were reviewed this encounter by the provider:   Tobacco  Allergies  Meds  Problems  Med Hx  Surg Hx  Fam Hx  Soc Hx          PHYSICAL EXAM     Vitals:    08/01/17 1116   BP: 116/84   Pulse: 88   Resp: 14   Temp: 98 F (36.7 C)   TempSrc: Tympanic   SpO2: 98%   Weight: 68.5 kg (151 lb)       Physical Exam   Constitutional: He is oriented to person, place, and time. He appears well-developed and well-nourished. No distress.   HENT:   Head: Normocephalic and  atraumatic.   Right Ear: External ear normal.   Left Ear: External ear normal.   Nose: Nose normal.   Mouth/Throat: Uvula is midline, oropharynx is  clear and moist and mucous membranes are normal.   Eyes: Pupils are equal, round, and reactive to light.   Neck: Normal range of motion. Neck supple.   Cardiovascular: Normal rate, regular rhythm and normal heart sounds.  Exam reveals no gallop and no friction rub.    No murmur heard.  Pulmonary/Chest: Effort normal and breath sounds normal. No respiratory distress. He has no decreased breath sounds. He has no wheezes. He has no rhonchi. He has no rales.   Abdominal: Soft. Normal appearance and bowel sounds are normal. There is no tenderness. There is no rigidity, no rebound, no guarding, no CVA tenderness, no tenderness at McBurney's point and negative Murphy's sign.   Lymphadenopathy:     He has no cervical adenopathy.   Neurological: He is alert and oriented to person, place, and time.   Skin: Skin is warm and dry. He is not diaphoretic.   Psychiatric: He has a normal mood and affect. His behavior is normal. Judgment and thought content normal.   Nursing note and vitals reviewed.    Ortho Exam  Neurologic Exam     Mental Status   Oriented to person, place, and time.     Cranial Nerves     CN III, IV, VI   Pupils are equal, round, and reactive to light.      PROCEDURE(S)     Procedures        Signed,  Morene Crocker, FNP  08/01/2017

## 2017-08-01 NOTE — Patient Instructions (Addendum)
Treating Diarrhea    Diarrhea happens when you have loose, watery, or frequent bowel movements. It is a common problem with many causes. Most cases of diarrhea clear up on their own. But certain cases may need treatment. Be sure to see your healthcare providerif your symptoms do not improve within a few days.  Getting relief  Treatment of diarrhea depends on its cause. Diarrhea caused by bacterial or parasite infection is often treated with antibiotics. Diarrhea caused by other factors, such as a stomach virus, often improves with simple home treatment. The tips below may also help relieve your symptoms.   Drink plenty of fluids. This helps prevent too much fluid loss (dehydration). Water, clear soups, and electrolyte solutions are good choices. Avoid alcohol, coffee, tea, and milk. These can irritate your intestines andmake symptoms worse.   Suck on ice chips if drinking makes you queasy.   Return to your normal diet slowly. You may want to eat bland foods at first, such as rice and toast. Also, you may need to avoid certain foods for a while, such as dairy products. These can make symptoms worse. Ask yourhealthcare providerif there are any other foods you should avoid.   If you were prescribed antibiotics, take them as directed.   Do not take anti-diarrhea medicines without asking yourhealthcare providerfirst.  Call your healthcare provider  Call your healthcare provider if you have any of the following:   A fever of 100.4F (38.0C) or higher, or as directed by your healthcare provider   Severe pain   Worsening diarrhea or diarrhea for more than 2 days   Bloody vomit or stool   Signs of dehydration (dizziness, dry mouth and tongue, rapid pulse, dark urine)  Date Last Reviewed: 03/06/2015   2000-2018 [employer] [address], [place] [address]. All rights reserved. This information is not intended as a substitute for professional medical care. Always follow your  healthcare professional's instructions.

## 2017-08-02 LAB — URINE CHLAMYDIA/NEISSERIA BY PCR
Chlamydia DNA by PCR: NEGATIVE
Neisseria gonorrhoeae by PCR: NEGATIVE

## 2017-08-02 LAB — HSV TYPE 1 AND 2 ANTIBODY IGG: HSV 2 IgG Type-Specific Antibody: <0.9 (ref <0.90)

## 2017-08-02 LAB — RPR (REFLEX TO TITER AND CONFIRMATION): RPR: NONREACTIVE

## 2017-08-02 LAB — HSV TYPE 1 AND 2 IGG: HSV 1 IgG Type-Specific AB: <0.9 (ref <0.90)

## 2017-08-24 ENCOUNTER — Emergency Department
Admission: EM | Admit: 2017-08-24 | Discharge: 2017-08-24 | Disposition: A | Discharge status: Home / Self Care | Payer: BLUE CROSS/BLUE SHIELD | Attending: Emergency Medicine | Admitting: Emergency Medicine

## 2017-08-24 ENCOUNTER — Emergency Department: Payer: BLUE CROSS/BLUE SHIELD

## 2017-08-24 DIAGNOSIS — S40022A Contusion of left upper arm, initial encounter: Secondary | ICD-10-CM

## 2017-08-24 DIAGNOSIS — S5012XA Contusion of left forearm, initial encounter: Secondary | ICD-10-CM | POA: Insufficient documentation

## 2017-08-24 DIAGNOSIS — X58XXXA Exposure to other specified factors, initial encounter: Secondary | ICD-10-CM | POA: Insufficient documentation

## 2017-08-24 NOTE — ED Triage Notes (Signed)
Pt c/o of L arm pain that began on Tuesday. Pt went to the gym on Monday is unaware that he injuried himself. Pt describes the pain as a stabbing sensation that has progressively gotten worse. Pt denies any weapons.

## 2017-08-24 NOTE — Discharge Instructions (Signed)
Contusion    You have been diagnosed with a contusion.    A contusion is a bruise. A contusion occurs when something strikes or hits the body. This breaks small blood vessels called capillaries. When the capillaries break, blood leaks out. This makes the skin look red, purple, blue, or black. The injured area may hurt for a few days. If you take a blood thinner like warfarin (Coumadin) the bruising may be worse.    Apply ice to the bruise. Avoid using the injured body part.    Apply ice to help with pain and swelling. Put some ice cubes in a re-sealable plastic bag (like Ziploc). Add some water. Seal the bag. Put a thin washcloth between the bag and the skin. Apply the ice bag for at least 20 minutes. Do this at least 4 times per day. It's okay to apply ice longer or more often. NEVER APPLY ICE DIRECTLY TO THE SKIN. Always keep a washcloth between the ice pack and your body.    YOU SHOULD SEEK MEDICAL ATTENTION IMMEDIATELY, EITHER HERE OR AT THE NEAREST EMERGENCY DEPARTMENT, IF ANY OF THE FOLLOWING OCCURS:   Your pain or swelling gets much worse.   You develop new numbness or tingling in or below the affected area.   Your foot or hand looks cold or pale. This could mean there is a problem with circulation (blood supply).

## 2017-08-24 NOTE — ED Provider Notes (Signed)
Physician/Midlevel provider first contact with patient: 08/24/17 1548         History     Chief Complaint   Patient presents with   . Arm Pain     Pt presents c/o sharp pain to mid left forearm for the past 2 days.  Pt states he did work out 3 days ago but did not recall injury.  Pt states he gets a sharp pain when he tries to grip or lift items.  No redness or swelling.        The history is provided by the patient. No language interpreter was used.   Arm Pain   This is a new problem. The current episode started in the past 7 days. The problem occurs constantly. The problem has been unchanged. Pertinent negatives include no chills, fever, nausea, neck pain, numbness, rash, vomiting or weakness. He has tried nothing for the symptoms.            History reviewed. No pertinent past medical history.    History reviewed. No pertinent surgical history.    History reviewed. No pertinent family history.    Social  Social History   Substance Use Topics   . Smoking status: Never Smoker   . Smokeless tobacco: Current User   . Alcohol use No       .     No Known Allergies    Home Medications     Med List Status:  In Progress Set By: Lenetta Quaker, RN at 08/24/2017  1:49 PM                ARIPiprazole (ABILIFY) 5 MG tablet     Take 2.5 mg by mouth daily.     gabapentin (NEURONTIN) 100 MG capsule     Take 400 mg by mouth 3 (three) times daily.         ondansetron (ZOFRAN ODT) 4 MG disintegrating tablet     Take 1 tablet (4 mg total) by mouth every 8 (eight) hours as needed for Nausea.                               Review of Systems   Constitutional: Negative for chills and fever.   Gastrointestinal: Negative for nausea and vomiting.   Genitourinary: Negative for dysuria and hematuria.   Musculoskeletal: Negative for back pain and neck pain.   Skin: Negative for color change, pallor, rash and wound.   Neurological: Negative for weakness and numbness.   Hematological: Negative for adenopathy. Does not bruise/bleed easily.    Psychiatric/Behavioral: Negative for agitation and confusion.       Physical Exam    BP: 121/78, Heart Rate: 74, Temp: 98.7 F (37.1 C), Resp Rate: 16, SpO2: 99 %, Weight: 68 kg    Physical Exam   Constitutional: He is oriented to person, place, and time. He appears well-developed and well-nourished. No distress.   HENT:   Head: Normocephalic and atraumatic.   Right Ear: External ear normal.   Left Ear: External ear normal.   Nose: Nose normal.   Mouth/Throat: Oropharynx is clear and moist. No oropharyngeal exudate.   Eyes: Pupils are equal, round, and reactive to light. Conjunctivae are normal. Right eye exhibits no discharge. Left eye exhibits no discharge. No scleral icterus.   Neck: Normal range of motion. Neck supple.   Pulmonary/Chest: Effort normal. No respiratory distress.   Musculoskeletal: Normal range of motion.  Left elbow: Normal.        Left wrist: Normal.        Left forearm: He exhibits tenderness and bony tenderness. He exhibits no swelling, no edema, no deformity and no laceration.        Arms:       Left hand: Normal.   Neurological: He is alert and oriented to person, place, and time.   Skin: Skin is warm and dry. Capillary refill takes less than 2 seconds. He is not diaphoretic.   Psychiatric: He has a normal mood and affect. His behavior is normal. Judgment and thought content normal.   Nursing note and vitals reviewed.        MDM and ED Course     ED Medication Orders     None             MDM  Number of Diagnoses or Management Options  Arm contusion, left, initial encounter:   Diagnosis management comments: [name] , PA-C, have been the primary provider for Billy Hunter during this Emergency Dept visit. The attending signature signifies review and agreement of the history, physical exam, evaluation, clinical impression and plan except as noted.   I have reviewed the nursing notes, including Past medical and surgical,Family and Social History.     Radiology results viewed and  discussed with pt    Discussed with patient need for follow-up. Return to the ER for any concerns. Pt voices understanding. No questions.          Amount and/or Complexity of Data Reviewed  Tests in the radiology section of CPT: ordered and reviewed  Discuss the patient with other providers: yes (Discussed with Dr Laurell Roof)    Risk of Complications, Morbidity, and/or Mortality  Presenting problems: moderate  Diagnostic procedures: moderate  Management options: moderate    Patient Progress  Patient progress: stable           Radiology Results (24 Hour)     Procedure Component Value Units Date/Time    Forearm Complete Left [[id number]] Collected:  08/24/17 1630    Order Status:  Completed Updated:  08/24/17 1636    Narrative:       AP lateral views of the left radius and ulna.    CLINICAL INDICATION: Pain. No prior studies for comparison.    FINDINGS: There is no fracture or dislocation. No significant soft  tissue swelling is seen. No periosteal reaction.      Impression:        No acute osseous injury.    Jasmine December  D'Heureux, MD   08/24/2017 4:32 PM                  Procedures    Clinical Impression & Disposition     Clinical Impression  Final diagnoses:   Arm contusion, left, initial encounter        ED Disposition     ED Disposition Condition Date/Time Comment    Discharge  Thu Aug 24, 2017  4:38 PM Billy Hunter.    Condition at disposition: Stable           New Prescriptions    No medications on file                 Ulla Gallo, [place]  08/24/17 1639       Herma Ard, MD  08/24/17 [phone]
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
## 2017-09-08 ENCOUNTER — Ambulatory Visit (INDEPENDENT_AMBULATORY_CARE_PROVIDER_SITE_OTHER): Payer: BLUE CROSS/BLUE SHIELD | Admitting: Family

## 2017-09-08 ENCOUNTER — Encounter (INDEPENDENT_AMBULATORY_CARE_PROVIDER_SITE_OTHER): Payer: Self-pay

## 2017-09-08 VITALS — BP 106/70 | HR 84 | Temp 98.4°F | Wt 150.0 lb

## 2017-09-08 DIAGNOSIS — J0100 Acute maxillary sinusitis, unspecified: Secondary | ICD-10-CM

## 2017-09-08 DIAGNOSIS — J029 Acute pharyngitis, unspecified: Secondary | ICD-10-CM

## 2017-09-08 LAB — POCT RAPID STREP A: Rapid Strep A Screen POCT: NEGATIVE

## 2017-09-08 MED ORDER — AMOXICILLIN-POT CLAVULANATE 875-125 MG PO TABS
1.0000 | ORAL_TABLET | Freq: Two times a day (BID) | ORAL | 0 refills | Status: DC
Start: 2017-09-08 — End: 2017-09-08

## 2017-09-08 MED ORDER — AMOXICILLIN-POT CLAVULANATE 875-125 MG PO TABS
1.0000 | ORAL_TABLET | Freq: Two times a day (BID) | ORAL | 0 refills | Status: AC
Start: 2017-09-08 — End: 2017-09-15

## 2017-09-08 MED ORDER — FLUTICASONE PROPIONATE 50 MCG/ACT NA SUSP
2.0000 | Freq: Every day | NASAL | 0 refills | Status: AC
Start: 2017-09-08 — End: ?

## 2017-09-08 MED ORDER — FLUTICASONE PROPIONATE 50 MCG/ACT NA SUSP
2.0000 | Freq: Every day | NASAL | 0 refills | Status: DC
Start: 2017-09-08 — End: 2017-09-08

## 2017-09-08 NOTE — Patient Instructions (Addendum)
Acute Sinusitis    Acute sinusitis isirritation and swelling of the sinuses. It is usuallycaused by a viral infection after a common cold. Your doctor can help you find relief.  What is acute sinusitis?  Sinuses are air-filled spaces in the skull behind the face. They are kept moist and clean by a lining of mucosa. Things such as pollen, smoke, and chemical fumes can irritate the mucosa. It can then swell up. As a response to irritation, the mucosa makes more mucus and other fluids. Tiny hairlike cilia cover the mucosa. Cilia help carry mucus toward the opening of the sinus. Too much mucus may cause the cilia to stop working. This blocks the sinus opening. A buildup of fluid in the sinuses then causes pain and pressure. It can also encourage bacteria to grow in the sinuses.  Common symptoms of acute sinusitis  You may have:   Facial sorenesspain   Headache   Fever   Fluid draining in the back of the throat (postnasal drip)   Congestion   Drainage that is thick and colored, instead of clear   Cough  Diagnosing acute sinusitis  Yourdoctor will ask about your symptoms and health history.He or she will look at your ear, nose, and throat. You usually won't need to have X-rays taken.  The doctor may take asample of mucus to check for bacteria. If you havesinusitis that keeps coming back, you may need imaging tests such asX-rays or CAT scans. This will help your doctorcheck for a structural problem that may be causingthe infection.  Treating acute sinusitis  Treatment is aimed atunblocking the sinus opening and helping the cilia work again. You may need to take antihistamine and decongestant medicine. These can reduce inflammation and decrease the amount of fluid your sinuses make. If you have a bacterial infection, you will need to takeantibiotic medicine for 10 to 14 days. Take this medicine until it is gone, even if you feel better.  Date Last Reviewed: 06/06/2015   2000-2018 [employer] [address], [place] [address]. All rights reserved. This information is not intended as a substitute for professional medical care. Always follow your healthcare professional's instructions.

## 2017-09-08 NOTE — Progress Notes (Signed)
[facility]    PROGRESS NOTE      Patient: Billy Hunter   Date: 09/08/2017   MRN: [id number]     History reviewed. No pertinent past medical history.  Social History     Social History   . Marital status: Single     Spouse name: N/A   . Number of children: N/A   . Years of education: N/A     Occupational History   . Not on file.     Social History Main Topics   . Smoking status: Never Smoker   . Smokeless tobacco: Current User   . Alcohol use No   . Drug use: No   . Sexual activity: Not on file     Other Topics Concern   . Not on file     Social History Narrative   . No narrative on file     History reviewed. No pertinent family history.    ASSESSMENT/PLAN     Billy Hunter is a 27 y.o. male    Chief Complaint   Patient presents with   . URI        1. Acute non-recurrent maxillary sinusitis  - amoxicillin-clavulanate (AUGMENTIN) 875-125 MG per tablet; Take 1 tablet by mouth 2 (two) times daily.for 7 days  Dispense: 14 tablet; Refill: 0  - fluticasone (FLONASE) 50 MCG/ACT nasal spray; 2 sprays by Nasal route daily.2 sprays each nostril daily  Dispense: 1 Bottle; Refill: 0    2. Sore throat  - POCT rapid strep A - negative    Increase fluid intake to 2-3 liters per day, steam inhalation 20-30 minutes, saline irrigation with nasal saline spray, warm compresses over the sinuses, sleep with head of bed elevated, avoid exposure to cigarette smoke or fumes, and avoid caffeine and alcohol. Tylenol and Ibuprofen can be used for fever and pain.Take antibiotic as prescribed. We reviewed side effects and use of this medication. Take with meals and probiotics to help avoid GI upset. If your symptoms worsen 3 days after starting antibiotics return to your PCP to be re-evaluated.  All questions answered, pt agrees with plan.      Ddx: viral URI, pharyngitis, bronchitis, sinusitis, pneumonia, allergic rhinitis, RAD, PND, influenza.       Risk & Benefits of the new medication(s) were explained to the patient  who verbalized understanding & agreed to the treatment plan. Patient was encouraged to contact me/clinical staff with any questions/concerns    Side effects of medication were discussed with the patient including nausea, diarrhea, dizziness, abdominal pain, or rash.    MEDICATIONS     Current Outpatient Prescriptions   Medication Sig Dispense Refill   . amoxicillin-clavulanate (AUGMENTIN) 875-125 MG per tablet Take 1 tablet by mouth 2 (two) times daily.for 7 days 14 tablet 0   . ARIPiprazole (ABILIFY) 5 MG tablet Take 2.5 mg by mouth daily.     . fluticasone (FLONASE) 50 MCG/ACT nasal spray 2 sprays by Nasal route daily.2 sprays each nostril daily 1 Bottle 0   . gabapentin (NEURONTIN) 100 MG capsule Take 400 mg by mouth 3 (three) times daily.           No current facility-administered medications for this visit.        No Known Allergies    SUBJECTIVE     Chief Complaint   Patient presents with   . URI        URI    This is a new problem. Episode  onset: 1.5 weeks. The problem has been gradually worsening. There has been no fever. Associated symptoms include congestion, coughing (dry ), headaches, sinus pain (pressure) and a sore throat. Pertinent negatives include no abdominal pain, diarrhea, ear pain, nausea, vomiting or wheezing. He has tried nothing for the symptoms.       ROS     Review of Systems   Constitutional: Positive for appetite change (decreased), chills (occasional) and fatigue. Negative for diaphoresis and fever.   HENT: Positive for congestion, postnasal drip, sinus pain (pressure), sinus pressure and sore throat. Negative for ear pain.    Respiratory: Positive for cough (dry ). Negative for chest tightness, shortness of breath and wheezing.    Gastrointestinal: Negative for abdominal pain, diarrhea, nausea and vomiting.   Musculoskeletal: Negative for myalgias.   Neurological: Positive for headaches.   Hematological: Negative for adenopathy.       The following sections were reviewed this encounter  by the provider:   Tobacco  Allergies  Meds  Problems  Med Hx  Surg Hx  Fam Hx  Soc Hx          PHYSICAL EXAM     Vitals:    09/08/17 1202   BP: 106/70   Pulse: 84   Temp: 98.4 F (36.9 C)   SpO2: 95%   Weight: 68 kg (150 lb)       Physical Exam   Constitutional: He is oriented to person, place, and time. He appears well-developed and well-nourished. No distress.   HENT:   Head: Normocephalic and atraumatic.   Right Ear: External ear and ear canal normal. A middle ear effusion is present.   Left Ear: External ear and ear canal normal. A middle ear effusion is present.   Nose: Mucosal edema present. Right sinus exhibits maxillary sinus tenderness. Right sinus exhibits no frontal sinus tenderness. Left sinus exhibits maxillary sinus tenderness. Left sinus exhibits no frontal sinus tenderness.   Mouth/Throat: Uvula is midline and mucous membranes are normal. Posterior oropharyngeal erythema present. No oropharyngeal exudate, posterior oropharyngeal edema or tonsillar abscesses.   +mucopurulent post nasal drip   Eyes: Pupils are equal, round, and reactive to light.   Neck: Normal range of motion. Neck supple.   Cardiovascular: Normal rate, regular rhythm and normal heart sounds.  Exam reveals no gallop and no friction rub.    No murmur heard.  Pulmonary/Chest: Effort normal and breath sounds normal. No respiratory distress. He has no decreased breath sounds. He has no wheezes. He has no rhonchi. He has no rales.   Lymphadenopathy:     He has no cervical adenopathy.   Neurological: He is alert and oriented to person, place, and time.   Skin: Skin is warm and dry. He is not diaphoretic.   Psychiatric: He has a normal mood and affect. His behavior is normal. Judgment and thought content normal.   Nursing note and vitals reviewed.    Ortho Exam  Neurologic Exam     Mental Status   Oriented to person, place, and time.     Cranial Nerves     CN III, IV, VI   Pupils are equal, round, and reactive to  light.      PROCEDURE(S)     Procedures        Signed,  Morene Crocker, FNP  09/08/2017

## 2018-06-14 ENCOUNTER — Encounter (HOSPITAL_COMMUNITY): Payer: Self-pay | Admitting: Emergency Medicine

## 2018-06-14 ENCOUNTER — Other Ambulatory Visit: Payer: Self-pay

## 2018-06-14 ENCOUNTER — Inpatient Hospital Stay (HOSPITAL_COMMUNITY)
Admission: EM | Admit: 2018-06-14 | Discharge: 2018-06-17 | DRG: 558 | Disposition: A | Discharge status: Home / Self Care | Payer: Self-pay | Attending: Internal Medicine | Admitting: Internal Medicine

## 2018-06-14 ENCOUNTER — Emergency Department (HOSPITAL_COMMUNITY): Payer: Self-pay

## 2018-06-14 DIAGNOSIS — F3181 Bipolar II disorder: Secondary | ICD-10-CM | POA: Diagnosis present

## 2018-06-14 DIAGNOSIS — R748 Abnormal levels of other serum enzymes: Secondary | ICD-10-CM | POA: Diagnosis present

## 2018-06-14 DIAGNOSIS — Z91018 Allergy to other foods: Secondary | ICD-10-CM

## 2018-06-14 DIAGNOSIS — K760 Fatty (change of) liver, not elsewhere classified: Secondary | ICD-10-CM | POA: Diagnosis present

## 2018-06-14 DIAGNOSIS — Z79899 Other long term (current) drug therapy: Secondary | ICD-10-CM

## 2018-06-14 DIAGNOSIS — R7989 Other specified abnormal findings of blood chemistry: Secondary | ICD-10-CM

## 2018-06-14 DIAGNOSIS — E860 Dehydration: Secondary | ICD-10-CM

## 2018-06-14 DIAGNOSIS — R109 Unspecified abdominal pain: Secondary | ICD-10-CM

## 2018-06-14 DIAGNOSIS — Z791 Long term (current) use of non-steroidal anti-inflammatories (NSAID): Secondary | ICD-10-CM

## 2018-06-14 DIAGNOSIS — R945 Abnormal results of liver function studies: Secondary | ICD-10-CM

## 2018-06-14 DIAGNOSIS — K5900 Constipation, unspecified: Secondary | ICD-10-CM

## 2018-06-14 DIAGNOSIS — M6282 Rhabdomyolysis: Principal | ICD-10-CM

## 2018-06-14 HISTORY — DX: Bipolar II disorder: F31.81

## 2018-06-14 LAB — COMPREHENSIVE METABOLIC PANEL
ALT: 66 U/L — AB (ref 0–44)
AST: 155 U/L — AB (ref 15–41)
Albumin: 4 g/dL (ref 3.5–5.0)
Alkaline Phosphatase: 53 U/L (ref 38–126)
Anion gap: 9 (ref 5–15)
BUN: 14 mg/dL (ref 6–20)
CHLORIDE: 106 mmol/L (ref 98–111)
CO2: 26 mmol/L (ref 22–32)
Calcium: 9.4 mg/dL (ref 8.9–10.3)
Creatinine, Ser: 0.99 mg/dL (ref 0.61–1.24)
GFR calc Af Amer: >60 mL/min (ref >60)
GFR calc non Af Amer: >60 mL/min (ref >60)
Glucose, Bld: 96 mg/dL (ref 70–99)
Potassium: 3.7 mmol/L (ref 3.5–5.1)
SODIUM: 141 mmol/L (ref 135–145)
Total Bilirubin: 0.9 mg/dL (ref 0.3–1.2)
Total Protein: 7.2 g/dL (ref 6.5–8.1)

## 2018-06-14 LAB — LIPASE, BLOOD: Lipase: 34 U/L (ref 11–51)

## 2018-06-14 LAB — URINALYSIS, ROUTINE W REFLEX MICROSCOPIC
Bilirubin Urine: NEGATIVE
GLUCOSE, UA: NEGATIVE mg/dL
Hgb urine dipstick: NEGATIVE
Ketones, ur: NEGATIVE mg/dL
LEUKOCYTES UA: NEGATIVE
NITRITE: NEGATIVE
PROTEIN: NEGATIVE mg/dL
Specific Gravity, Urine: 1.017 (ref 1.005–1.030)
pH: 6 (ref 5.0–8.0)

## 2018-06-14 LAB — CBC WITH DIFFERENTIAL/PLATELET
Abs Immature Granulocytes: 0.02 10*3/uL (ref 0.00–0.07)
BASOS ABS: 0 10*3/uL (ref 0.0–0.1)
Basophils Relative: 1 %
Eosinophils Absolute: 0.1 10*3/uL (ref 0.0–0.5)
Eosinophils Relative: 2 %
HCT: 45.3 % (ref 39.0–52.0)
Hemoglobin: 14.6 g/dL (ref 13.0–17.0)
IMMATURE GRANULOCYTES: 0 %
Lymphocytes Relative: 22 %
Lymphs Abs: 1.3 10*3/uL (ref 0.7–4.0)
MCH: 27.7 pg (ref 26.0–34.0)
MCHC: 32.2 g/dL (ref 30.0–36.0)
MCV: 86 fL (ref 80.0–100.0)
Monocytes Absolute: 0.5 10*3/uL (ref 0.1–1.0)
Monocytes Relative: 9 %
NEUTROS ABS: 4 10*3/uL (ref 1.7–7.7)
NEUTROS PCT: 66 %
PLATELETS: 208 10*3/uL (ref 150–400)
RBC: 5.27 MIL/uL (ref 4.22–5.81)
RDW: 13.2 % (ref 11.5–15.5)
WBC: 5.9 10*3/uL (ref 4.0–10.5)
nRBC: 0 % (ref 0.0–0.2)

## 2018-06-14 LAB — CK: CK TOTAL: 7526 U/L — AB (ref 49–397)

## 2018-06-14 MED ORDER — SODIUM CHLORIDE 0.9 % IV BOLUS
1000.0000 mL | Freq: Once | INTRAVENOUS | Status: AC
  Administered 2018-06-14: 1000 mL via INTRAVENOUS

## 2018-06-14 MED ORDER — IOPAMIDOL (ISOVUE-300) INJECTION 61%
INTRAVENOUS | Status: AC
  Administered 2018-06-14: 11:00:00
  Filled 2018-06-14: qty 100

## 2018-06-14 MED ORDER — POLYETHYLENE GLYCOL 3350 17 G PO PACK
17.0000 g | PACK | Freq: Every day | ORAL | Status: DC
  Administered 2018-06-14 – 2018-06-17 (×4): 17 g via ORAL
  Filled 2018-06-14 (×4): qty 1

## 2018-06-14 MED ORDER — SODIUM CHLORIDE 0.9 % IJ SOLN
INTRAMUSCULAR | Status: AC
  Administered 2018-06-14: 11:00:00
  Filled 2018-06-14: qty 50

## 2018-06-14 MED ORDER — ONDANSETRON HCL 4 MG/2ML IJ SOLN
4.0000 mg | Freq: Once | INTRAMUSCULAR | Status: AC
  Administered 2018-06-14: 4 mg via INTRAVENOUS
  Filled 2018-06-14: qty 2

## 2018-06-14 MED ORDER — IOPAMIDOL (ISOVUE-300) INJECTION 61%
100.0000 mL | Freq: Once | INTRAVENOUS | Status: AC | PRN
  Administered 2018-06-14: 100 mL via INTRAVENOUS

## 2018-06-14 MED ORDER — LACTATED RINGERS IV SOLN
INTRAVENOUS | Status: DC
  Administered 2018-06-14: 14:00:00 via INTRAVENOUS

## 2018-06-14 NOTE — ED Notes (Signed)
Report given to [facility]. Pt ready to be transported to 1521.

## 2018-06-14 NOTE — Progress Notes (Signed)
Patient was transferred from ED to 5 E at 1423. Alert and oriented x 4.

## 2018-06-14 NOTE — ED Provider Notes (Signed)
[facility]-EMERGENCY DEPT Provider Note   CSN: [id number] Arrival date & time: 06/14/18  4098     History   Chief Complaint Chief Complaint  Patient presents with  . Constipation    HPI Sultan Pargas is a 27 y.o. male.  The history is provided by the patient and medical records. No language interpreter was used.  Abdominal Pain   This is a new problem. The current episode started more than 1 week ago. The problem occurs constantly. The problem has not changed since onset.The pain is associated with eating. The pain is located in the generalized abdominal region. The quality of the pain is aching and cramping. The pain is at a severity of 4/10. The pain is moderate. Associated symptoms include anorexia, nausea and constipation. Pertinent negatives include fever, diarrhea, vomiting, dysuria, frequency and headaches. The symptoms are aggravated by palpation and eating. Nothing relieves the symptoms.    Past Medical History:  Diagnosis Date  . Bipolar 2 disorder (HCC)     There are no active problems to display for this patient.   History reviewed. No pertinent surgical history.      Home Medications    Prior to Admission medications   Not on File    Family History No family history on file.  Social History Social History   Tobacco Use  . Smoking status: Never Smoker  . Smokeless tobacco: Never Used  Substance Use Topics  . Alcohol use: Not Currently  . Drug use: Yes    Types: Marijuana     Allergies   Patient has no known allergies.   Review of Systems Review of Systems  Constitutional: Negative for chills, diaphoresis, fatigue and fever.  HENT: Negative for congestion.   Respiratory: Negative for chest tightness, shortness of breath and wheezing.   Cardiovascular: Negative for chest pain.  Gastrointestinal: Positive for abdominal distention, abdominal pain, anorexia, constipation and nausea. Negative for blood in stool,  diarrhea, rectal pain and vomiting.  Genitourinary: Negative for dysuria, flank pain and frequency.  Musculoskeletal: Negative for back pain and neck pain.  Neurological: Negative for light-headedness and headaches.  Psychiatric/Behavioral: Negative for agitation.  All other systems reviewed and are negative.    Physical Exam Updated Vital Signs BP (!) 132/99 (BP Location: Left Arm)   Pulse 82   Temp 98.1 F (36.7 C) (Oral)   Resp 17   SpO2 100%   Physical Exam  Constitutional: He is oriented to person, place, and time. He appears well-developed and well-nourished. No distress.  HENT:  Head: Normocephalic and atraumatic.  Mouth/Throat: Oropharynx is clear and moist. No oropharyngeal exudate.  Eyes: Pupils are equal, round, and reactive to light. Conjunctivae and EOM are normal.  Neck: Neck supple.  Cardiovascular: Normal rate and regular rhythm.  No murmur heard. Pulmonary/Chest: Effort normal and breath sounds normal. No respiratory distress. He has no wheezes. He has no rales. He exhibits no tenderness.  Abdominal: Soft. There is tenderness. There is no guarding.  Musculoskeletal: He exhibits no edema or tenderness.  Neurological: He is alert and oriented to person, place, and time. No sensory deficit. He exhibits normal muscle tone.  Skin: Skin is warm and dry. Capillary refill takes less than 2 seconds. He is not diaphoretic. No erythema. No pallor.  Psychiatric: He has a normal mood and affect.  Nursing note and vitals reviewed.    ED Treatments / Results  Labs (all labs ordered are listed, but only abnormal results are displayed) Labs Reviewed  COMPREHENSIVE METABOLIC PANEL - Abnormal; Notable for the following components:      Result Value   AST 155 (*)    ALT 66 (*)    All other components within normal limits  CK - Abnormal; Notable for the following components:   Total CK 7,526 (*)    All other components within normal limits  CBC WITH DIFFERENTIAL/PLATELET    LIPASE, BLOOD  URINALYSIS, ROUTINE W REFLEX MICROSCOPIC  HIV ANTIBODY (ROUTINE TESTING W REFLEX)  HEPATITIS PANEL, ACUTE  CBC  COMPREHENSIVE METABOLIC PANEL  CK    EKG None  Radiology Ct Abdomen Pelvis W Contrast  Result Date: 06/14/2018 CLINICAL DATA:  Abdominal distension with nausea and vomiting EXAM: CT ABDOMEN AND PELVIS WITH CONTRAST TECHNIQUE: Multidetector CT imaging of the abdomen and pelvis was performed using the standard protocol following bolus administration of intravenous contrast. CONTRAST:  ISOVUE-300 IOPAMIDOL (ISOVUE-300) INJECTION 61% COMPARISON:  None. FINDINGS: Lower chest: Lung bases are clear. Hepatobiliary: There is hepatic steatosis. No focal liver lesions are appreciable. Gallbladder wall is not appreciably thickened. There is no biliary duct dilatation. Pancreas: No pancreatic mass or inflammatory focus. Spleen: No splenic lesions are evident. Adrenals/Urinary Tract: Adrenals bilaterally appear normal. Kidneys bilaterally show no evident mass or hydronephrosis on either side. There is no appreciable renal or ureteral calculus on either side. Urinary bladder is midline with wall thickness within normal limits. Stomach/Bowel: There is moderate stool throughout colon. There is no appreciable bowel wall or mesenteric thickening. There is no evident bowel obstruction. There is no free air or portal venous air. Vascular/Lymphatic: There is no abdominal aortic aneurysm. No vascular lesions are appreciable on this study. No adenopathy is evident in the abdomen or pelvis. Reproductive: Prostate and seminal vesicles appear normal in size and contour. No evident pelvic mass. Other: Appendix appears unremarkable. There is no abscess or ascites in the abdomen or pelvis. Musculoskeletal: There are no blastic or lytic bone lesions. There is no intramuscular or abdominal wall lesion evident. IMPRESSION: 1. A cause for patient's symptoms has not been established with this study. 2.  No bowel obstruction. No abscess in the abdomen or pelvis. Appendix appears normal. 3.  No renal or ureteral calculus.  No hydronephrosis. 4.  There is hepatic steatosis. Electronically Signed   By: Bretta Bang III M.D.   On: 06/14/2018 11:01    Procedures Procedures (including critical care time)  Medications Ordered in ED Medications  lactated ringers infusion ( Intravenous Rate/Dose Verify 06/14/18 1600)  polyethylene glycol (MIRALAX / GLYCOLAX) packet 17 g (17 g Oral Given 06/14/18 1421)  sodium chloride 0.9 % bolus 1,000 mL (0 mLs Intravenous Stopped 06/14/18 1020)  ondansetron (ZOFRAN) injection 4 mg (4 mg Intravenous Given 06/14/18 0848)  iopamidol (ISOVUE-300) 61 % injection 100 mL (100 mLs Intravenous Contrast Given 06/14/18 1026)  iopamidol (ISOVUE-300) 61 % injection (  Contrast Given 06/14/18 1114)  sodium chloride 0.9 % injection (  Given by Other 06/14/18 1114)  sodium chloride 0.9 % bolus 1,000 mL (0 mLs Intravenous Stopped 06/14/18 1336)     Initial Impression / Assessment and Plan / ED Course  I have reviewed the triage vital signs and the nursing notes.  Pertinent labs & imaging results that were available during my care of the patient were reviewed by me and considered in my medical decision making (see chart for details).     Rylynn Schoneman is a 27 y.o. male with a past medical history significant bipolar disorder who presents with  abdominal discomfort, nausea, decreased p.o. intake, constipation, and abdominal distention.  Patient reports that for the last 2 months he has been having bloating abdominal distention and abdominal cramping.  He reports that he is only had one bowel movement each week for the last 2 weeks.  He reports that he took several doses of Dulcolax yesterday and had a very small bowel movement that was less than he anticipated after eating a pizza.  He reports some nausea but no vomiting.  He thinks he may be dehydrated over the last few  weeks with the environmental heat.  He says that he does not take medications for his bipolar disorder.  He says that he has started working out over the last few months.  He reports his abdomen is feeling distended and that whenever he eats he gets very full quickly and gets nauseous.  He reports he has been a big eater all his life and this is a significant change.  He denies any other problems including no chest pain, shortness of breath, cough, fevers, chills, or urinary symptoms.  Patient denies any rectal bleeding or rectal pain.  On exam, abdomen is slightly distended and diffusely tender.  Patient grimaces with palpation in the epigastrium.  No CVA tenderness.  Lungs clear.  Chest is nontender.  Exam otherwise unremarkable.  Clinical I suspect patient is dehydrated in the setting of new exercise, the environmental heat, and decreased oral intake.  I suspect this worsened his constipation to the point where he is having the abdominal distention and discomfort.  With his abrupt change however, and with his diffuse tenderness, will obtain labs, administer fluids and nausea medicine, and get a CT scan to rule out partial obstruction or other significant ab normality.  Anticipate reassessment after work-up.   12:14 PM CT scan was reassuring other than hepatic steatosis.  Liver function elevated but he denies any history of liver troubles.  Next  More concerning was the patient's CK which was over 7500.  Patient reports he has been working out since Tuesday's only several days of new exercise.  Patient does report he is having muscle aches and cramps throughout his body.  Suspect patient is in rhabdo.   Given the patient's abdominal discomfort, constipation, and the discovery of rhabdo likely from dehydration with his decreased p.o. intake at baseline and the exercise, patient will be admitted for IV fluids and monitoring.   Final Clinical Impressions(s) / ED Diagnoses   Final diagnoses:    Non-traumatic rhabdomyolysis  Constipation, unspecified constipation type  Abdominal pain, unspecified abdominal location  LFT elevation  Dehydration    ED Discharge Orders    None      Clinical Impression: 1. Non-traumatic rhabdomyolysis   2. Constipation, unspecified constipation type   3. Abdominal pain, unspecified abdominal location   4. LFT elevation   5. Dehydration     Disposition: Admit  This note was prepared with assistance of Dragon voice recognition software. Occasional wrong-word or sound-a-like substitutions may have occurred due to the inherent limitations of voice recognition software.     Tegeler, Canary Brim, MD 06/14/18 2010

## 2018-06-14 NOTE — ED Notes (Signed)
ED TO INPATIENT HANDOFF REPORT  Name/Age/Gender Scott Gordon 27 y.o. male  Code Status   Home/SNF/Other Home  Chief Complaint Constipation  Level of Care/Admitting Diagnosis ED Disposition    ED Disposition Condition [facility] Area: [place] [[id number]]  Level of Care: Med-Surg [16]  Diagnosis: Elevated CK [546503]  Admitting Physician: Elodia Florence [phone]  Attending Physician: Cephus Slater, A CALDWELL [phone]  PT Class (Do Not Modify): Observation [104]  PT Acc Code (Do Not Modify): Observation [10022]       Medical History Past Medical History:  Diagnosis Date  . Bipolar 2 disorder (HCC)     Allergies Allergies  Allergen Reactions  . Pineapple Hives    IV Location/Drains/Wounds Patient Lines/Drains/Airways Status   Active Line/Drains/Airways    Name:   Placement date:   Placement time:   Site:   Days:   Peripheral IV 06/14/18 Right Arm   06/14/18    0847    Arm   less than 1          Labs/Imaging Results for orders placed or performed during the hospital encounter of 06/14/18 (from the past 48 hour(s))  CBC with Differential     Status: None   Collection Time: 06/14/18  8:43 AM  Result Value Ref Range   WBC 5.9 4.0 - 10.5 K/uL   RBC 5.27 4.22 - 5.81 MIL/uL   Hemoglobin 14.6 13.0 - 17.0 g/dL   HCT 45.3 39.0 - 52.0 %   MCV 86.0 80.0 - 100.0 fL   MCH 27.7 26.0 - 34.0 pg   MCHC 32.2 30.0 - 36.0 g/dL   RDW 13.2 11.5 - 15.5 %   Platelets 208 150 - 400 K/uL   nRBC 0.0 0.0 - 0.2 %   Neutrophils Relative % 66 %   Neutro Abs 4.0 1.7 - 7.7 K/uL   Lymphocytes Relative 22 %   Lymphs Abs 1.3 0.7 - 4.0 K/uL   Monocytes Relative 9 %   Monocytes Absolute 0.5 0.1 - 1.0 K/uL   Eosinophils Relative 2 %   Eosinophils Absolute 0.1 0.0 - 0.5 K/uL   Basophils Relative 1 %   Basophils Absolute 0.0 0.0 - 0.1 K/uL   Immature Granulocytes 0 %   Abs Immature Granulocytes 0.02 0.00 - 0.07 K/uL    Comment: Performed at  [facility], [place] [address], [place], [place] [address]  Comprehensive metabolic panel     Status: Abnormal   Collection Time: 06/14/18  8:43 AM  Result Value Ref Range   Sodium 141 135 - 145 mmol/L   Potassium 3.7 3.5 - 5.1 mmol/L   Chloride 106 98 - 111 mmol/L   CO2 26 22 - 32 mmol/L   Glucose, Bld 96 70 - 99 mg/dL   BUN 14 6 - 20 mg/dL   Creatinine, Ser 0.99 0.61 - 1.24 mg/dL   Calcium 9.4 8.9 - 10.3 mg/dL   Total Protein 7.2 6.5 - 8.1 g/dL   Albumin 4.0 3.5 - 5.0 g/dL   AST 155 (H) 15 - 41 U/L   ALT 66 (H) 0 - 44 U/L   Alkaline Phosphatase 53 38 - 126 U/L   Total Bilirubin 0.9 0.3 - 1.2 mg/dL   GFR calc non Af Amer >60 >60 mL/min   GFR calc Af Amer >60 >60 mL/min    Comment: (NOTE) The eGFR has been calculated using the CKD EPI equation. This calculation has not been validated  in all clinical situations. eGFR's persistently <60 mL/min signify possible Chronic Kidney Disease.    Anion gap 9 5 - 15    Comment: Performed at [facility], [place] [address], [place], [place] [address]  Lipase, blood     Status: None   Collection Time: 06/14/18  8:43 AM  Result Value Ref Range   Lipase 34 11 - 51 U/L    Comment: Performed at [facility], [place] [address] [place], [place] [address]  CK     Status: Abnormal   Collection Time: 06/14/18  8:43 AM  Result Value Ref Range   Total CK 7,526 (H) 49 - 397 U/L    Comment: RESULTS CONFIRMED BY MANUAL DILUTION Performed at [facility], [place] [address] [place], [place] [address]   Urinalysis, Routine w reflex microscopic     Status: None   Collection Time: 06/14/18  8:43 AM  Result Value Ref Range   Color, Urine YELLOW YELLOW   APPearance CLEAR CLEAR   Specific Gravity, Urine 1.017 1.005 - 1.030   pH 6.0 5.0 - 8.0   Glucose, UA NEGATIVE NEGATIVE mg/dL   Hgb urine dipstick NEGATIVE NEGATIVE   Bilirubin Urine NEGATIVE NEGATIVE   Ketones, ur NEGATIVE NEGATIVE  mg/dL   Protein, ur NEGATIVE NEGATIVE mg/dL   Nitrite NEGATIVE NEGATIVE   Leukocytes, UA NEGATIVE NEGATIVE    Comment: Performed at [place] [address] [place], [place] [address]   Ct Abdomen Pelvis W Contrast  Result Date: 06/14/2018 CLINICAL DATA:  Abdominal distension with nausea and vomiting EXAM: CT ABDOMEN AND PELVIS WITH CONTRAST TECHNIQUE: Multidetector CT imaging of the abdomen and pelvis was performed using the standard protocol following bolus administration of intravenous contrast. CONTRAST:  130m ISOVUE-300 IOPAMIDOL (ISOVUE-300) INJECTION 61% COMPARISON:  None. FINDINGS: Lower chest: Lung bases are clear. Hepatobiliary: There is hepatic steatosis. No focal liver lesions are appreciable. Gallbladder wall is not appreciably thickened. There is no biliary duct dilatation. Pancreas: No pancreatic mass or inflammatory focus. Spleen: No splenic lesions are evident. Adrenals/Urinary Tract: Adrenals bilaterally appear normal. Kidneys bilaterally show no evident mass or hydronephrosis on either side. There is no appreciable renal or ureteral calculus on either side. Urinary bladder is midline with wall thickness within normal limits. Stomach/Bowel: There is moderate stool throughout colon. There is no appreciable bowel wall or mesenteric thickening. There is no evident bowel obstruction. There is no free air or portal venous air. Vascular/Lymphatic: There is no abdominal aortic aneurysm. No vascular lesions are appreciable on this study. No adenopathy is evident in the abdomen or pelvis. Reproductive: Prostate and seminal vesicles appear normal in size and contour. No evident pelvic mass. Other: Appendix appears unremarkable. There is no abscess or ascites in the abdomen or pelvis. Musculoskeletal: There are no blastic or lytic bone lesions. There is no intramuscular or abdominal wall lesion evident. IMPRESSION: 1. A cause for patient's symptoms has not been established  with this study. 2. No bowel obstruction. No abscess in the abdomen or pelvis. Appendix appears normal. 3.  No renal or ureteral calculus.  No hydronephrosis. 4.  There is hepatic steatosis. Electronically Signed   By: WLowella GripIII M.D.   On: 06/14/2018 11:01   None  Pending Labs [employer](From admission, onward)    Start     Ordered   Signed and Held  HIV antibody (Routine Testing)  Once,   R     Signed and Held  Signed and Held  CBC  Tomorrow morning,   R     Signed and Held   Signed and Held  Comprehensive metabolic panel  Tomorrow morning,   R     Signed and Held   Signed and Held  CK  Tomorrow morning,   R     Signed and Held   Signed and Held  Hepatitis panel, acute  Once,   R     Signed and Held          Vitals/Pain Today's Vitals   06/14/18 0900 06/14/18 1000 06/14/18 1015 06/14/18 1224  BP: 107/78 108/64 108/64 115/76  Pulse:   66 65  Resp:   18 18  Temp:   97.9 F (36.6 C) 98 F (36.7 C)  TempSrc:   Oral Oral  SpO2:   99% 100%  Weight:    76.7 kg  PainSc:    4     Isolation Precautions No active isolations  Medications Medications  lactated ringers infusion (has no administration in time range)  sodium chloride 0.9 % bolus 1,000 mL (0 mLs Intravenous Stopped 06/14/18 1020)  ondansetron (ZOFRAN) injection 4 mg (4 mg Intravenous Given 06/14/18 0848)  iopamidol (ISOVUE-300) 61 % injection 100 mL (100 mLs Intravenous Contrast Given 06/14/18 1026)  iopamidol (ISOVUE-300) 61 % injection (  Contrast Given 06/14/18 1114)  sodium chloride 0.9 % injection (  Given by Other 06/14/18 1114)  sodium chloride 0.9 % bolus 1,000 mL (0 mLs Intravenous Stopped 06/14/18 1336)    Mobility walks

## 2018-06-14 NOTE — ED Triage Notes (Signed)
Pt reports that over the past few weeks only had one non regular BM per week. Yesterday took 2 Dulcolax tablets and had small BM 4 hours later.  Pt reports that has felt bloated over past 2 months and reports when he eats feels that he has eaten too much.

## 2018-06-14 NOTE — H&P (Signed)
History and Physical    Scott Gordon ZOX:[id number] DOB: Oct 10, 1990 DOA: 06/14/2018  PCP: Patient, No Pcp Per  Patient coming from: home  I have personally briefly reviewed patient's old medical records in [facility]  Chief Complaint: abdominal discomfort  HPI: Scott Gordon is Scott Gordon 27 y.o. male with medical history significant of bipolar disorder presenting with symptoms of abdominal discomfort over the past few months.  He notes that over the past few months he he is noticed bloating.  Recently he has been eating half of what he is been used to.  He is noticed that he has been having less frequent bowel movements and feels dehydrated.  He wonders if he may be constipated.  He had Liadan Guizar normal bowel movement yesterday around 5.  He does not feel like he ever completely empty his spell.  He denies any abdominal pain, but notes more bloating and discomfort.  He denies any fevers, chills, cough or cold, chest pain, shortness of breath, urinary symptoms, numbness, tingling, weakness.  He notes that he started going to the gym on Monday and has worked out consistently since then.  He notes soreness essentially all over, but mostly to his arms.  He notes this is slightly more than he did expect.  ED Course: Labs, CT, IVF.  Admit for rhabdo.  Review of Systems: As per HPI otherwise 10 point review of systems negative.   Past Medical History:  Diagnosis Date  . Bipolar 2 disorder [facility])     Past Surgical History:  Procedure Laterality Date  . WISDOM TOOTH EXTRACTION       reports that he has never smoked. He has never used smokeless tobacco. He reports that he drank alcohol. He reports that he has current or past drug history. Drug: Marijuana.  Allergies  Allergen Reactions  . Pineapple Hives    History reviewed. No pertinent family history.  Prior to Admission medications   Medication Sig Start Date End Date Taking? Authorizing Provider  Biotin w/ Vitamins C & E  (HAIR/SKIN/NAILS PO) Take 3 tablets by mouth daily.   Yes [provider]  ibuprofen (ADVIL,MOTRIN) 200 MG tablet Take 400 mg by mouth every 6 (six) hours as needed for headache, mild pain or moderate pain.   Yes [provider]    Physical Exam: Vitals:   06/14/18 1000 06/14/18 1015 06/14/18 1224 06/14/18 1453  BP: 108/64 108/64 115/76 126/78  Pulse:  66 65 72  Resp:  18 18 18   Temp:  97.9 F (36.6 C) 98 F (36.7 C) 97.9 F (36.6 C)  TempSrc:  Oral Oral Oral  SpO2:  99% 100% 99%  Weight:   76.7 kg     Constitutional: NAD, calm, comfortable Vitals:   06/14/18 1000 06/14/18 1015 06/14/18 1224 06/14/18 1453  BP: 108/64 108/64 115/76 126/78  Pulse:  66 65 72  Resp:  18 18 18   Temp:  97.9 F (36.6 C) 98 F (36.7 C) 97.9 F (36.6 C)  TempSrc:  Oral Oral Oral  SpO2:  99% 100% 99%  Weight:   76.7 kg    Eyes: PERRL, lids and conjunctivae normal ENMT: Mucous membranes are moist. Posterior pharynx clear of any exudate or lesions.Normal dentition.  Neck: normal, supple, no masses, no thyromegaly Respiratory: clear to auscultation bilaterally, no wheezing, no crackles. Normal respiratory effort. No accessory muscle use.  Cardiovascular: Regular rate and rhythm, no murmurs / rubs / gallops. No extremity edema. 2+ pedal pulses. No carotid bruits.  Abdomen:  no tenderness, no masses palpated. No hepatosplenomegaly. Bowel sounds positive.  Musculoskeletal: no clubbing / cyanosis. No joint deformity upper and lower extremities. Good ROM, no contractures. Normal muscle tone.  No evidence of compartment syndrome on exam. Skin: no rashes, lesions, ulcers. No induration Neurologic: CN 2-12 grossly intact. Sensation intact. Strength 5/5 in all 4.  Psychiatric: Normal judgment and insight. Alert and oriented x 3. Normal mood.   Labs on Admission: I have personally reviewed following labs and imaging studies  CBC: Recent Labs  Lab 06/14/18 0843  WBC 5.9  NEUTROABS 4.0    HGB 14.6  HCT 45.3  MCV 86.0  PLT 208   Basic Metabolic Panel: Recent Labs  Lab 06/14/18 0843  NA 141  K 3.7  CL 106  CO2 26  GLUCOSE 96  BUN 14  CREATININE 0.99  CALCIUM 9.4   GFR: CrCl cannot be calculated (Unknown ideal weight.). Liver Function Tests: Recent Labs  Lab 06/14/18 0843  AST 155*  ALT 66*  ALKPHOS 53  BILITOT 0.9  PROT 7.2  ALBUMIN 4.0   Recent Labs  Lab 06/14/18 0843  LIPASE 34   No results for input(s): AMMONIA in the last 168 hours. Coagulation Profile: No results for input(s): INR, PROTIME in the last 168 hours. Cardiac Enzymes: Recent Labs  Lab 06/14/18 0843  CKTOTAL 7,526*   BNP (last 3 results) No results for input(s): PROBNP in the last 8760 hours. HbA1C: No results for input(s): HGBA1C in the last 72 hours. CBG: No results for input(s): GLUCAP in the last 168 hours. Lipid Profile: No results for input(s): CHOL, HDL, LDLCALC, TRIG, CHOLHDL, LDLDIRECT in the last 72 hours. Thyroid Function Tests: No results for input(s): TSH, T4TOTAL, FREET4, T3FREE, THYROIDAB in the last 72 hours. Anemia Panel: No results for input(s): VITAMINB12, FOLATE, FERRITIN, TIBC, IRON, RETICCTPCT in the last 72 hours. Urine analysis:    Component Value Date/Time   COLORURINE YELLOW 06/14/2018 0843   APPEARANCEUR CLEAR 06/14/2018 0843   LABSPEC 1.017 06/14/2018 0843   PHURINE 6.0 06/14/2018 0843   GLUCOSEU NEGATIVE 06/14/2018 0843   HGBUR NEGATIVE 06/14/2018 0843   BILIRUBINUR NEGATIVE 06/14/2018 0843   KETONESUR NEGATIVE 06/14/2018 0843   PROTEINUR NEGATIVE 06/14/2018 0843   NITRITE NEGATIVE 06/14/2018 0843   LEUKOCYTESUR NEGATIVE 06/14/2018 0843    Radiological Exams on Admission: Ct Abdomen Pelvis W Contrast  Result Date: 06/14/2018 CLINICAL DATA:  Abdominal distension with nausea and vomiting EXAM: CT ABDOMEN AND PELVIS WITH CONTRAST TECHNIQUE: Multidetector CT imaging of the abdomen and pelvis was performed using the standard protocol  following bolus administration of intravenous contrast. CONTRAST:  ISOVUE-300 IOPAMIDOL (ISOVUE-300) INJECTION 61% COMPARISON:  None. FINDINGS: Lower chest: Lung bases are clear. Hepatobiliary: There is hepatic steatosis. No focal liver lesions are appreciable. Gallbladder wall is not appreciably thickened. There is no biliary duct dilatation. Pancreas: No pancreatic mass or inflammatory focus. Spleen: No splenic lesions are evident. Adrenals/Urinary Tract: Adrenals bilaterally appear normal. Kidneys bilaterally show no evident mass or hydronephrosis on either side. There is no appreciable renal or ureteral calculus on either side. Urinary bladder is midline with wall thickness within normal limits. Stomach/Bowel: There is moderate stool throughout colon. There is no appreciable bowel wall or mesenteric thickening. There is no evident bowel obstruction. There is no free air or portal venous air. Vascular/Lymphatic: There is no abdominal aortic aneurysm. No vascular lesions are appreciable on this study. No adenopathy is evident in the abdomen or pelvis. Reproductive: Prostate and seminal vesicles  appear normal in size and contour. No evident pelvic mass. Other: Appendix appears unremarkable. There is no abscess or ascites in the abdomen or pelvis. Musculoskeletal: There are no blastic or lytic bone lesions. There is no intramuscular or abdominal wall lesion evident. IMPRESSION: 1. Azure Budnick cause for patient's symptoms has not been established with this study. 2. No bowel obstruction. No abscess in the abdomen or pelvis. Appendix appears normal. 3.  No renal or ureteral calculus.  No hydronephrosis. 4.  There is hepatic steatosis. Electronically Signed   By: Bretta Bang III M.D.   On: 06/14/2018 11:01    EKG: Independently reviewed. None performed  Assessment/Plan Active Problems:   Elevated CK  Rhabdomyolysis:  Pt with elevated CK, but no urinary findings and normal renal function.  Suspect based on  history this may be related to his recent workouts as he describes more soreness than he'd expect, especially in his arms.  Also has elevated AST/ALT, likely due to rhabdo as well.  Will hydrate and follow repeat CK and kidney function and LFT's in the morning. Continue MIVF with LR Follow CK and CMP in AM  Abdominal Discomfort  Bloating: pt describes feeling constipated.  CT is unremarkable.  Will start bowel regimen with daily miralax and follow symptoms.    Elevated Liver Enzymes:  Likely related to #1.  follow acute hepatitis panel, CT notable for steatosis.  Will need continued outpatient follow up.  Pt vapes, discussed risks of vaping and recommended cessation  Pt without PCP, care management c/s placed  DVT prophylaxis: SCD  Code Status: full  Family Communication: none at bedside  Disposition Plan: pending improvement, likely tomorrow  Consults called: none  Admission status: observation    Lacretia Nicks MD [facility] Pager [phone]  If 7PM-7AM, please contact night-coverage [email or web address] Password [facility]  06/14/2018, 5:34 PM

## 2018-06-15 DIAGNOSIS — R748 Abnormal levels of other serum enzymes: Secondary | ICD-10-CM

## 2018-06-15 LAB — COMPREHENSIVE METABOLIC PANEL
ALK PHOS: 57 U/L (ref 38–126)
ALT: 68 U/L — AB (ref 0–44)
AST: 142 U/L — AB (ref 15–41)
Albumin: 3.7 g/dL (ref 3.5–5.0)
Anion gap: 8 (ref 5–15)
BUN: 11 mg/dL (ref 6–20)
CALCIUM: 9.2 mg/dL (ref 8.9–10.3)
CHLORIDE: 108 mmol/L (ref 98–111)
CO2: 26 mmol/L (ref 22–32)
Creatinine, Ser: 0.94 mg/dL (ref 0.61–1.24)
Glucose, Bld: 98 mg/dL (ref 70–99)
Potassium: 4.1 mmol/L (ref 3.5–5.1)
Sodium: 142 mmol/L (ref 135–145)
Total Bilirubin: 0.7 mg/dL (ref 0.3–1.2)
Total Protein: 6.6 g/dL (ref 6.5–8.1)

## 2018-06-15 LAB — CBC
HEMATOCRIT: 46.9 % (ref 39.0–52.0)
HEMOGLOBIN: 14.5 g/dL (ref 13.0–17.0)
MCH: 27.1 pg (ref 26.0–34.0)
MCHC: 30.9 g/dL (ref 30.0–36.0)
MCV: 87.5 fL (ref 80.0–100.0)
NRBC: 0 % (ref 0.0–0.2)
PLATELETS: 209 10*3/uL (ref 150–400)
RBC: 5.36 MIL/uL (ref 4.22–5.81)
RDW: 13.4 % (ref 11.5–15.5)
WBC: 7.2 10*3/uL (ref 4.0–10.5)

## 2018-06-15 LAB — HEPATITIS PANEL, ACUTE
HCV Ab: <0.1 s/co ratio (ref 0.0–0.9)
Hep A IgM: NEGATIVE
Hep B C IgM: NEGATIVE
Hepatitis B Surface Ag: NEGATIVE

## 2018-06-15 LAB — CK: CK TOTAL: 10371 U/L — AB (ref 49–397)

## 2018-06-15 LAB — HIV ANTIBODY (ROUTINE TESTING W REFLEX): HIV SCREEN 4TH GENERATION: NONREACTIVE

## 2018-06-15 MED ORDER — SODIUM CHLORIDE 0.9 % IV SOLN
INTRAVENOUS | Status: DC
  Administered 2018-06-15 – 2018-06-17 (×7): via INTRAVENOUS

## 2018-06-15 NOTE — Progress Notes (Signed)
PROGRESS NOTE    Scott Gordon  ZOX:[id number] DOB: 1990/12/01 DOA: 06/14/2018 PCP: Patient, No Pcp Per   Brief Narrative: Patient is a 27 year old male with past medical history of bipolar disorder who presented to the emergency department with complaints of abdominal discomfort.  Patient was also found to have elevated CK level and was admitted for rhabdomyolysis.  Assessment & Plan:   Active Problems:   Elevated CK   Non-traumatic rhabdomyolysis   Abdominal pain  Rhabdomyolysis: Found to have elevated CK level.  Reported that he was in gym few days ago which could be the reason for rhabdomyolysis. CK level in the range of 10,000s today.  Continue IV fluids.  Will check CK level tomorrow.  Abdominal discomfort/bloating/constipation: Resolved .Patient having bowel movement.  CT is unremarkable.  Elevated liver enzymes: Most likely secondary to rhabdomyolysis.  CT also showed hepatic steatosis.  Acute hepatitis panel negative.  Can follow LFTs as an outpatient.   DVT prophylaxis: SCD Code Status: Full Family Communication: None present at the bed side Disposition Plan: Home in 1-2 days after improvement in the CK level   Consultants: None  Procedures:None  Antimicrobials:None  Subjective: Patient seen and examined at bedside this morning.  Remains comfortable.  No complaints.  Denies any abdominal pain.  Has been having bowel movements  Objective: Vitals:   06/14/18 1224 06/14/18 1453 06/14/18 2006 06/15/18 0512  BP: 115/76 126/78 121/86 115/66  Pulse: 65 72 (!) 54 63  Resp: 18 18 19 18   Temp: 98 F (36.7 C) 97.9 F (36.6 C) 97.8 F (36.6 C) 97.9 F (36.6 C)  TempSrc: Oral Oral Oral Oral  SpO2: 100% 99% 98% 97%  Weight: 76.7 kg   77 kg    Intake/Output Summary (Last 24 hours) at 06/15/2018 1232 Last data filed at 06/15/2018 0900 Gross per 24 hour  Intake 3536.99 ml  Output -  Net 3536.99 ml   Filed Weights   06/14/18 1224 06/15/18 0512  Weight:  76.7 kg 77 kg    Examination:  General exam: Appears calm and comfortable ,Not in distress,average built HEENT:PERRL,Oral mucosa moist, Ear/Nose normal on gross exam Respiratory system: Bilateral equal air entry, normal vesicular breath sounds, no wheezes or crackles  Cardiovascular system: S1 & S2 heard, RRR. No JVD, murmurs, rubs, gallops or clicks. No pedal edema. Gastrointestinal system: Abdomen is nondistended, soft and nontender. No organomegaly or masses felt. Normal bowel sounds heard. Central nervous system: Alert and oriented. No focal neurological deficits. Extremities: No edema, no clubbing ,no cyanosis, distal peripheral pulses palpable. Skin: No rashes, lesions or ulcers,no icterus ,no pallor MSK: Normal muscle bulk,tone ,power Psychiatry: Judgement and insight appear normal. Mood & affect appropriate.     Data Reviewed: I have personally reviewed following labs and imaging studies  CBC: Recent Labs  Lab 06/14/18 0843 06/15/18 0601  WBC 5.9 7.2  NEUTROABS 4.0  --   HGB 14.6 14.5  HCT 45.3 46.9  MCV 86.0 87.5  PLT 208 209   Basic Metabolic Panel: Recent Labs  Lab 06/14/18 0843 06/15/18 0601  NA 141 142  K 3.7 4.1  CL 106 108  CO2 26 26  GLUCOSE 96 98  BUN 14 11  CREATININE 0.99 0.94  CALCIUM 9.4 9.2   GFR: CrCl cannot be calculated (Unknown ideal weight.). Liver Function Tests: Recent Labs  Lab 06/14/18 0843 06/15/18 0601  AST 155* 142*  ALT 66* 68*  ALKPHOS 53 57  BILITOT 0.9 0.7  PROT 7.2 6.6  ALBUMIN  4.0 3.7   Recent Labs  Lab 06/14/18 0843  LIPASE 34   No results for input(s): AMMONIA in the last 168 hours. Coagulation Profile: No results for input(s): INR, PROTIME in the last 168 hours. Cardiac Enzymes: Recent Labs  Lab 06/14/18 0843 06/15/18 0601  CKTOTAL 7,526* 10,371*   BNP (last 3 results) No results for input(s): PROBNP in the last 8760 hours. HbA1C: No results for input(s): HGBA1C in the last 72 hours. CBG: No  results for input(s): GLUCAP in the last 168 hours. Lipid Profile: No results for input(s): CHOL, HDL, LDLCALC, TRIG, CHOLHDL, LDLDIRECT in the last 72 hours. Thyroid Function Tests: No results for input(s): TSH, T4TOTAL, FREET4, T3FREE, THYROIDAB in the last 72 hours. Anemia Panel: No results for input(s): VITAMINB12, FOLATE, FERRITIN, TIBC, IRON, RETICCTPCT in the last 72 hours. Sepsis Labs: No results for input(s): PROCALCITON, LATICACIDVEN in the last 168 hours.  No results found for this or any previous visit (from the past 240 hour(s)).       Radiology Studies: Ct Abdomen Pelvis W Contrast  Result Date: 06/14/2018 CLINICAL DATA:  Abdominal distension with nausea and vomiting EXAM: CT ABDOMEN AND PELVIS WITH CONTRAST TECHNIQUE: Multidetector CT imaging of the abdomen and pelvis was performed using the standard protocol following bolus administration of intravenous contrast. CONTRAST:  ISOVUE-300 IOPAMIDOL (ISOVUE-300) INJECTION 61% COMPARISON:  None. FINDINGS: Lower chest: Lung bases are clear. Hepatobiliary: There is hepatic steatosis. No focal liver lesions are appreciable. Gallbladder wall is not appreciably thickened. There is no biliary duct dilatation. Pancreas: No pancreatic mass or inflammatory focus. Spleen: No splenic lesions are evident. Adrenals/Urinary Tract: Adrenals bilaterally appear normal. Kidneys bilaterally show no evident mass or hydronephrosis on either side. There is no appreciable renal or ureteral calculus on either side. Urinary bladder is midline with wall thickness within normal limits. Stomach/Bowel: There is moderate stool throughout colon. There is no appreciable bowel wall or mesenteric thickening. There is no evident bowel obstruction. There is no free air or portal venous air. Vascular/Lymphatic: There is no abdominal aortic aneurysm. No vascular lesions are appreciable on this study. No adenopathy is evident in the abdomen or pelvis. Reproductive:  Prostate and seminal vesicles appear normal in size and contour. No evident pelvic mass. Other: Appendix appears unremarkable. There is no abscess or ascites in the abdomen or pelvis. Musculoskeletal: There are no blastic or lytic bone lesions. There is no intramuscular or abdominal wall lesion evident. IMPRESSION: 1. A cause for patient's symptoms has not been established with this study. 2. No bowel obstruction. No abscess in the abdomen or pelvis. Appendix appears normal. 3.  No renal or ureteral calculus.  No hydronephrosis. 4.  There is hepatic steatosis. Electronically Signed   By: Bretta Bang III M.D.   On: 06/14/2018 11:01        Scheduled Meds: . polyethylene glycol  17 g Oral Daily   Continuous Infusions: . sodium chloride 150 mL/hr at 06/15/18 0939     LOS: 0 days    Time spent: 25 mins.More than 50% of that time was spent in counseling and/or coordination of care.      Burnadette Pop, MD [facility] Pager [phone]  If 7PM-7AM, please contact night-coverage [email or web address] Password [email or web address] 06/15/2018, 12:32 PM

## 2018-06-16 LAB — COMPREHENSIVE METABOLIC PANEL
ALBUMIN: 3.8 g/dL (ref 3.5–5.0)
ALK PHOS: 55 U/L (ref 38–126)
ALT: 85 U/L — ABNORMAL HIGH (ref 0–44)
ANION GAP: 7 (ref 5–15)
AST: 151 U/L — ABNORMAL HIGH (ref 15–41)
BUN: 5 mg/dL — ABNORMAL LOW (ref 6–20)
CALCIUM: 8.9 mg/dL (ref 8.9–10.3)
CHLORIDE: 107 mmol/L (ref 98–111)
CO2: 28 mmol/L (ref 22–32)
Creatinine, Ser: 0.76 mg/dL (ref 0.61–1.24)
GFR calc Af Amer: >60 mL/min (ref >60)
GFR calc non Af Amer: >60 mL/min (ref >60)
GLUCOSE: 102 mg/dL — AB (ref 70–99)
Potassium: 4 mmol/L (ref 3.5–5.1)
SODIUM: 142 mmol/L (ref 135–145)
Total Bilirubin: 0.9 mg/dL (ref 0.3–1.2)
Total Protein: 7.1 g/dL (ref 6.5–8.1)

## 2018-06-16 LAB — CK: Total CK: 10223 U/L — ABNORMAL HIGH (ref 49–397)

## 2018-06-16 MED ORDER — ACETAMINOPHEN 325 MG PO TABS
650.0000 mg | ORAL_TABLET | Freq: Four times a day (QID) | ORAL | Status: DC | PRN
  Administered 2018-06-16: 650 mg via ORAL
  Filled 2018-06-16: qty 2

## 2018-06-16 NOTE — Progress Notes (Signed)
PROGRESS NOTE    Scott Gordon  ZOX:[id number] DOB: 06/10/1991 DOA: 06/14/2018 PCP: Patient, No Pcp Per   Brief Narrative: Patient is a 27 year old male with past medical history of bipolar disorder who presented to the emergency department with complaints of abdominal discomfort.  Patient was also found to have elevated CK level and was admitted for rhabdomyolysis.  Assessment & Plan:   Active Problems:   Elevated CK   Non-traumatic rhabdomyolysis   Abdominal pain  Rhabdomyolysis: Found to have elevated CK level.  Reported that he was in gym few days ago which could be the reason for rhabdomyolysis. CK level still in the range of 10,000s today.  Continue IV fluids.  Will check CK level tomorrow.  Abdominal discomfort/bloating/constipation: Resolved .Patient having bowel movement.  CT is unremarkable.  Elevated liver enzymes: Most likely secondary to rhabdomyolysis.  CT also showed hepatic steatosis.  Acute hepatitis panel negative.  Can follow LFTs as an outpatient.   DVT prophylaxis: SCD Code Status: Full Family Communication: None present at the bed side Disposition Plan: Home in 1-2 days after improvement in the CK level   Consultants: None  Procedures:None  Antimicrobials:None  Subjective: Patient seen and examined at bedside this morning.  Remains comfortable.  No complaints.  Denies any abdominal pain.  Has been having bowel movements  Objective: Vitals:   06/15/18 1400 06/15/18 2106 06/16/18 0300 06/16/18 0452  BP: 119/80 112/62  103/62  Pulse: 62 64  63  Resp: 16 16  15   Temp: 98.5 F (36.9 C) 98.4 F (36.9 C)  97.6 F (36.4 C)  TempSrc: Oral Oral  Oral  SpO2: 95% 98%    Weight:   68 kg     Intake/Output Summary (Last 24 hours) at 06/16/2018 1315 Last data filed at 06/16/2018 0915 Gross per 24 hour  Intake 1237.54 ml  Output -  Net 1237.54 ml   Filed Weights   06/14/18 1224 06/15/18 0512 06/16/18 0300  Weight: 76.7 kg 77 kg 68 kg     Examination:  General exam: Appears calm and comfortable ,Not in distress,average built HEENT:PERRL,Oral mucosa moist, Ear/Nose normal on gross exam Respiratory system: Bilateral equal air entry, normal vesicular breath sounds, no wheezes or crackles  Cardiovascular system: S1 & S2 heard, RRR. No JVD, murmurs, rubs, gallops or clicks. No pedal edema. Gastrointestinal system: Abdomen is nondistended, soft and nontender. No organomegaly or masses felt. Normal bowel sounds heard. Central nervous system: Alert and oriented. No focal neurological deficits. Extremities: No edema, no clubbing ,no cyanosis, distal peripheral pulses palpable. Skin: No rashes, lesions or ulcers,no icterus ,no pallor MSK: Normal muscle bulk,tone ,power Psychiatry: Judgement and insight appear normal. Mood & affect appropriate.     Data Reviewed: I have personally reviewed following labs and imaging studies  CBC: Recent Labs  Lab 06/14/18 0843 06/15/18 0601  WBC 5.9 7.2  NEUTROABS 4.0  --   HGB 14.6 14.5  HCT 45.3 46.9  MCV 86.0 87.5  PLT 208 209   Basic Metabolic Panel: Recent Labs  Lab 06/14/18 0843 06/15/18 0601 06/16/18 1155  NA 141 142 142  K 3.7 4.1 4.0  CL 106 108 107  CO2 26 26 28   GLUCOSE 96 98 102*  BUN 14 11 5*  CREATININE 0.99 0.94 0.76  CALCIUM 9.4 9.2 8.9   GFR: CrCl cannot be calculated (Unknown ideal weight.). Liver Function Tests: Recent Labs  Lab 06/14/18 0843 06/15/18 0601 06/16/18 1155  AST 155* 142* 151*  ALT 66* 68* 85*  ALKPHOS 53 57 55  BILITOT 0.9 0.7 0.9  PROT 7.2 6.6 7.1  ALBUMIN 4.0 3.7 3.8   Recent Labs  Lab 06/14/18 0843  LIPASE 34   No results for input(s): AMMONIA in the last 168 hours. Coagulation Profile: No results for input(s): INR, PROTIME in the last 168 hours. Cardiac Enzymes: Recent Labs  Lab 06/14/18 0843 06/15/18 0601 06/16/18 1155  CKTOTAL 7,526* 10,371* 10,223*   BNP (last 3 results) No results for input(s): PROBNP in  the last 8760 hours. HbA1C: No results for input(s): HGBA1C in the last 72 hours. CBG: No results for input(s): GLUCAP in the last 168 hours. Lipid Profile: No results for input(s): CHOL, HDL, LDLCALC, TRIG, CHOLHDL, LDLDIRECT in the last 72 hours. Thyroid Function Tests: No results for input(s): TSH, T4TOTAL, FREET4, T3FREE, THYROIDAB in the last 72 hours. Anemia Panel: No results for input(s): VITAMINB12, FOLATE, FERRITIN, TIBC, IRON, RETICCTPCT in the last 72 hours. Sepsis Labs: No results for input(s): PROCALCITON, LATICACIDVEN in the last 168 hours.  No results found for this or any previous visit (from the past 240 hour(s)).       Radiology Studies: No results found.      Scheduled Meds: . polyethylene glycol  17 g Oral Daily   Continuous Infusions: . sodium chloride 200 mL/hr at 06/16/18 1252     LOS: 1 day    Time spent: 25 mins.More than 50% of that time was spent in counseling and/or coordination of care.      Burnadette Pop, MD [facility] Pager [phone]  If 7PM-7AM, please contact night-coverage [email or web address] Password [email or web address] 06/16/2018, 1:15 PM

## 2018-06-17 LAB — CK: Total CK: 9660 U/L — ABNORMAL HIGH (ref 49–397)

## 2018-06-17 NOTE — Progress Notes (Signed)
Patient is being discharged home. discharge instructions were given to patient

## 2018-06-17 NOTE — Discharge Summary (Signed)
Physician Discharge Summary  Scott Gordon ZOX:[id number] DOB: Jul 06, 1991 DOA: 06/14/2018  PCP: Patient, No Pcp Per  Admit date: 06/14/2018 Discharge date: 06/17/2018  Admitted From: Home Disposition:  Home  Discharge Condition:Stable CODE STATUS:FULL Diet recommendation:  Regular  Brief/Interim Summary:  Patient is a 27 year old male with past medical history of bipolar disorder who presented to the emergency department with complaints of abdominal discomfort.  Patient was also found to have elevated CK level and was admitted for rhabdomyolysis.  Patient was treated with aggressive fluid administration.  His kidney function remained stable. Patient has been discharged to home today.  He has been advised to drink plenty of fluids at home.  He has been advised to follow-up at [facility] to check CK level in a week.  Following problems were addressed during his hospitalization:  Rhabdomyolysis: Found to have elevated CK level.  Reported that he was in gym few days ago which could be the reason for rhabdomyolysis. CK level was in the range of 10,000s .    Started on aggressive fluid administration.  Slight improvement in the CK level today.  Since his kidney function is intact, he can be discharged home to follow-up as an outpatient to check his CK level.  Patient has been advised to drink plenty of fluids.  Abdominal discomfort/bloating/constipation: Resolved .Patient having bowel movement.  CT is unremarkable.  Elevated liver enzymes: Most likely secondary to rhabdomyolysis.  CT also showed hepatic steatosis.  Acute hepatitis panel negative.  Can follow LFTs as an outpatient.   Discharge Diagnoses:  Active Problems:   Elevated CK   Non-traumatic rhabdomyolysis   Abdominal pain    Discharge Instructions  Discharge Instructions    Diet - low sodium heart healthy   Complete by:  As directed    Discharge instructions   Complete by:  As  directed    1) Please drink plenty of fluids a day( at least 2-3 litres for next 3-5 days) 2)Follow up with [facility] for an appointment in a week. Check creatinine kinase level during the follow up.Check liver function testing 3 months. 3) Donot go to gym for at least couple of weeks until CK level comes out to be normal.  Always hydrate yourself during the exercise.  Avoid strenuous physical activity.   Increase activity slowly   Complete by:  As directed      Allergies as of 06/17/2018      Reactions   Pineapple Hives      Medication List    TAKE these medications   HAIR/SKIN/NAILS PO Take 3 tablets by mouth daily.   ibuprofen 200 MG tablet Commonly known as:  ADVIL,MOTRIN Take 400 mg by mouth every 6 (six) hours as needed for headache, mild pain or moderate pain.      Follow-up Information    [facility]. Schedule an appointment as soon as possible for a visit.   Why:  call Monday Am at 9 a.m. to arrange a hospital hosital follow up appointment.  and to get a primary care physician. Contact information: [address] [place] [address] [phone]         Allergies  Allergen Reactions  . Pineapple Hives    Consultations: None Procedures/Studies: Ct Abdomen Pelvis W Contrast  Result Date: 06/14/2018 CLINICAL DATA:  Abdominal distension with nausea and vomiting EXAM: CT ABDOMEN AND PELVIS WITH CONTRAST TECHNIQUE: Multidetector CT imaging of the  abdomen and pelvis was performed using the standard protocol following bolus administration of intravenous contrast. CONTRAST:  ISOVUE-300 IOPAMIDOL (ISOVUE-300) INJECTION 61% COMPARISON:  None. FINDINGS: Lower chest: Lung bases are clear. Hepatobiliary: There is hepatic steatosis. No focal liver lesions are appreciable. Gallbladder wall is not appreciably thickened. There is no biliary duct dilatation. Pancreas: No pancreatic  mass or inflammatory focus. Spleen: No splenic lesions are evident. Adrenals/Urinary Tract: Adrenals bilaterally appear normal. Kidneys bilaterally show no evident mass or hydronephrosis on either side. There is no appreciable renal or ureteral calculus on either side. Urinary bladder is midline with wall thickness within normal limits. Stomach/Bowel: There is moderate stool throughout colon. There is no appreciable bowel wall or mesenteric thickening. There is no evident bowel obstruction. There is no free air or portal venous air. Vascular/Lymphatic: There is no abdominal aortic aneurysm. No vascular lesions are appreciable on this study. No adenopathy is evident in the abdomen or pelvis. Reproductive: Prostate and seminal vesicles appear normal in size and contour. No evident pelvic mass. Other: Appendix appears unremarkable. There is no abscess or ascites in the abdomen or pelvis. Musculoskeletal: There are no blastic or lytic bone lesions. There is no intramuscular or abdominal wall lesion evident. IMPRESSION: 1. A cause for patient's symptoms has not been established with this study. 2. No bowel obstruction. No abscess in the abdomen or pelvis. Appendix appears normal. 3.  No renal or ureteral calculus.  No hydronephrosis. 4.  There is hepatic steatosis. Electronically Signed   By: Bretta Bang III M.D.   On: 06/14/2018 11:01       Subjective: Patient seen and examined the bedside this morning.  Remains comfortable.  Denies any complaints.  Stable for discharge to home today.  Discharge Exam: Vitals:   06/16/18 2200 06/17/18 0600  BP: 120/73 105/70  Pulse: (!) 56 (!) 59  Resp: 20 20  Temp: 97.9 F (36.6 C) 97.9 F (36.6 C)  SpO2: 100% 98%   Vitals:   06/16/18 0452 06/16/18 1405 06/16/18 2200 06/17/18 0600  BP: 103/62 113/67 120/73 105/70  Pulse: 63 67 (!) 56 (!) 59  Resp: 15 18 20 20   Temp: 97.6 F (36.4 C) 98.5 F (36.9 C) 97.9 F (36.6 C) 97.9 F (36.6 C)  TempSrc: Oral  Oral Oral Oral  SpO2:  99% 100% 98%  Weight:    69.3 kg    General: Pt is alert, awake, not in acute distress Cardiovascular: RRR, S1/S2 +, no rubs, no gallops Respiratory: CTA bilaterally, no wheezing, no rhonchi Abdominal: Soft, NT, ND, bowel sounds + Extremities: no edema, no cyanosis    The results of significant diagnostics from this hospitalization (including imaging, microbiology, ancillary and laboratory) are listed below for reference.     Microbiology: No results found for this or any previous visit (from the past 240 hour(s)).   Labs: BNP (last 3 results) No results for input(s): BNP in the last 8760 hours. Basic Metabolic Panel: Recent Labs  Lab 06/14/18 0843 06/15/18 0601 06/16/18 1155  NA 141 142 142  K 3.7 4.1 4.0  CL 106 108 107  CO2 26 26 28   GLUCOSE 96 98 102*  BUN 14 11 5*  CREATININE 0.99 0.94 0.76  CALCIUM 9.4 9.2 8.9   Liver Function Tests: Recent Labs  Lab 06/14/18 0843 06/15/18 0601 06/16/18 1155  AST 155* 142* 151*  ALT 66* 68* 85*  ALKPHOS 53 57 55  BILITOT 0.9 0.7 0.9  PROT 7.2 6.6 7.1  ALBUMIN  4.0 3.7 3.8   Recent Labs  Lab 06/14/18 0843  LIPASE 34   No results for input(s): AMMONIA in the last 168 hours. CBC: Recent Labs  Lab 06/14/18 0843 06/15/18 0601  WBC 5.9 7.2  NEUTROABS 4.0  --   HGB 14.6 14.5  HCT 45.3 46.9  MCV 86.0 87.5  PLT 208 209   Cardiac Enzymes: Recent Labs  Lab 06/14/18 0843 06/15/18 0601 06/16/18 1155 06/17/18 0540  CKTOTAL 7,526* 10,371* 10,223* 9,660*   BNP: Invalid input(s): POCBNP CBG: No results for input(s): GLUCAP in the last 168 hours. D-Dimer No results for input(s): DDIMER in the last 72 hours. Hgb A1c No results for input(s): HGBA1C in the last 72 hours. Lipid Profile No results for input(s): CHOL, HDL, LDLCALC, TRIG, CHOLHDL, LDLDIRECT in the last 72 hours. Thyroid function studies No results for input(s): TSH, T4TOTAL, T3FREE, THYROIDAB in the last 72 hours.  Invalid  input(s): FREET3 Anemia work up No results for input(s): VITAMINB12, FOLATE, FERRITIN, TIBC, IRON, RETICCTPCT in the last 72 hours. Urinalysis    Component Value Date/Time   COLORURINE YELLOW 06/14/2018 0843   APPEARANCEUR CLEAR 06/14/2018 0843   LABSPEC 1.017 06/14/2018 0843   PHURINE 6.0 06/14/2018 0843   GLUCOSEU NEGATIVE 06/14/2018 0843   HGBUR NEGATIVE 06/14/2018 0843   BILIRUBINUR NEGATIVE 06/14/2018 0843   KETONESUR NEGATIVE 06/14/2018 0843   PROTEINUR NEGATIVE 06/14/2018 0843   NITRITE NEGATIVE 06/14/2018 0843   LEUKOCYTESUR NEGATIVE 06/14/2018 0843   Sepsis Labs Invalid input(s): PROCALCITONIN,  WBC,  LACTICIDVEN Microbiology No results found for this or any previous visit (from the past 240 hour(s)).  Please note: You were cared for by a hospitalist during your hospital stay. Once you are discharged, your primary care physician will handle any further medical issues. Please note that NO REFILLS for any discharge medications will be authorized once you are discharged, as it is imperative that you return to your primary care physician (or establish a relationship with a primary care physician if you do not have one) for your post hospital discharge needs so that they can reassess your need for medications and monitor your lab values.    Time coordinating discharge: 40 minutes  SIGNED:   Burnadette Pop, MD  [facility] 06/17/2018, 11:26 AM Pager [phone]  If 7PM-7AM, please contact night-coverage [email or web address] Password [email or web address]

## 2018-07-03 ENCOUNTER — Ambulatory Visit: Payer: Self-pay | Attending: Family Medicine | Admitting: Physician Assistant

## 2018-07-03 VITALS — BP 101/63 | HR 86 | Temp 98.4°F | Resp 18 | Ht 67.0 in | Wt 148.0 lb

## 2018-07-03 DIAGNOSIS — R740 Nonspecific elevation of levels of transaminase and lactic acid dehydrogenase [LDH]: Secondary | ICD-10-CM | POA: Insufficient documentation

## 2018-07-03 DIAGNOSIS — M6282 Rhabdomyolysis: Secondary | ICD-10-CM

## 2018-07-03 DIAGNOSIS — Z09 Encounter for follow-up examination after completed treatment for conditions other than malignant neoplasm: Secondary | ICD-10-CM

## 2018-07-03 DIAGNOSIS — F1290 Cannabis use, unspecified, uncomplicated: Secondary | ICD-10-CM | POA: Insufficient documentation

## 2018-07-03 DIAGNOSIS — F3181 Bipolar II disorder: Secondary | ICD-10-CM

## 2018-07-03 NOTE — Patient Instructions (Addendum)
Still prefer you avoid working out until levels are all down Stay hydrated! I am referring you to Psychiatry, would hate to prescribe meds before knowing that you liver function has stabilized

## 2018-07-03 NOTE — Progress Notes (Signed)
Scott Gordon  KGM:[id number]  UYQ:[id number]  DOB - 05/09/1991  Chief Complaint  Patient presents with  . Hospitalization Follow-up       Subjective:   Scott Gordon is a 27 y.o. male here today for establishment of care.  This is a pleasant 27 year old male with a history of HPV, bipolar 2 disorder, marijuana use and recent suspected rhabdomyolysis who is here for establishment of care.  He went to the hospital on 06/14/2018 with complaints of constipation.  He also was experiencing some generalized abdominal pain intermittently for 1 week.  His symptoms was worse with eating and described as moderate in intensity.  He had also recently change his workout regimen and felt also tightness with bicep maneuvers over the last several days as well.  A CT of the abdomen and pelvis was negative with the exception of hepatic steatosis.  His CK level was 7226.  AST 155.  ALT 66.  Lipase normal.  He was admitted by the internal medicine team with aggressive IV hydration.  He was started on a bowel regimen with good response.  His CK level peaked at 10,371.  At discharge his CK was 9660.  AST 151.  ALT 85.  Hepatitis and HIV panel is normal.  He also states he has a history of bipolar 2 disorder and suffers mostly from a depressed mood but does have some mania as well.  He said difficulty sleeping.  Has had difficulty finding motivation to work or to eat or do just about anything.  He is lost interest to multiple things.  Previously he was cared for by psychiatry in [place] and believes that he was on gabapentin.  ROS: GEN: denies fever or chills, denies change in weight Skin: denies lesions or rashes HEENT: denies headache, earache, epistaxis, sore throat, or neck pain LUNGS: denies SHOB, dyspnea, PND, orthopnea CV: denies CP or palpitations ABD: denies abd pain, N or V EXT: denies muscle spasms or swelling; no pain in lower ext, no weakness NEURO: denies numbness or tingling,  denies sz, stroke or TIA  ALLERGIES: Allergies  Allergen Reactions  . Pineapple Hives    PAST MEDICAL HISTORY: Past Medical History:  Diagnosis Date  . Bipolar 2 disorder (HCC)     PAST SURGICAL HISTORY: Past Surgical History:  Procedure Laterality Date  . WISDOM TOOTH EXTRACTION      MEDICATIONS AT HOME: Prior to Admission medications   Medication Sig Start Date End Date Taking? Authorizing Provider  Biotin w/ Vitamins C & E (HAIR/SKIN/NAILS PO) Take 3 tablets by mouth daily.   Yes [provider]  ibuprofen (ADVIL,MOTRIN) 200 MG tablet Take 400 mg by mouth every 6 (six) hours as needed for headache, mild pain or moderate pain.   Yes [provider]    Family hx-noncontributory  Social-unmarried, no children, works PT and is in school  Objective:   Vitals:   07/03/18 1448  BP: 101/63  Pulse: 86  Resp: 18  Temp: 98.4 F (36.9 C)  TempSrc: Oral  SpO2: 98%  Weight: 148 lb (67.1 kg)  Height: 5\' 7"  (1.702 m)    Exam General appearance : Awake, alert, not in any distress. Speech Clear. Not toxic looking HEENT: Atraumatic and Normocephalic, pupils equally reactive to light and accomodation Neck: supple, no JVD. No cervical lymphadenopathy.  Chest:Good air entry bilaterally, no added sounds  CVS: S1 S2 regular, no murmurs.  Abdomen: Bowel sounds present, Non tender and not distended with no guarding, rigidity or  rebound. Extremities: B/L Lower Ext shows no edema, both legs are warm to touch Neurology: Awake alert, and oriented X 3, CN II-XII intact, Non focal Skin:No Rash Wounds:N/A   Assessment & Plan  1. Elevated CK, suspected Rhabdomyolysis  -repeat CK level today  -hydration  -hold off on physical exercise until #s stabilize 2. Elevated Transaminases  -negative Hep panel and HIV  -repeat level today  -avoid liver toxic meds/foods etc 3. Bipolar 2  -with inc LFTs, would like to defer meds to psych  -psych referral   Return in  about 4 weeks (around 07/31/2018).  The patient was given clear instructions to go to ER or return to medical center if symptoms don't improve, worsen or new problems develop. The patient verbalized understanding. The patient was told to call to get lab results if they haven't heard anything in the next week.   Total time spent with patient was 36 min. Greater than 50 % of this visit was spent face to face counseling and coordinating care regarding risk factor modification, compliance importance and encouragement, education related to hospitalization.  This note has been created with Education officer, environmental. Any transcriptional errors are unintentional.    Scot Jun, PA-C [facility] and [facility] [place], [place] [phone]   07/03/2018, 2:59 PM

## 2018-07-03 NOTE — Addendum Note (Signed)
Addended by: Margaretmary Lombard on: 07/03/2018 03:41 PM   Modules accepted: Orders

## 2018-07-04 LAB — COMPREHENSIVE METABOLIC PANEL
ALBUMIN: 4.3 g/dL (ref 3.5–5.5)
ALK PHOS: 71 IU/L (ref 39–117)
ALT: 32 IU/L (ref 0–44)
AST: 21 IU/L (ref 0–40)
Albumin/Globulin Ratio: 1.7 (ref 1.2–2.2)
BILIRUBIN TOTAL: 0.3 mg/dL (ref 0.0–1.2)
BUN / CREAT RATIO: 11 (ref 9–20)
BUN: 10 mg/dL (ref 6–20)
CHLORIDE: 103 mmol/L (ref 96–106)
CO2: 25 mmol/L (ref 20–29)
Calcium: 9.3 mg/dL (ref 8.7–10.2)
Creatinine, Ser: 0.87 mg/dL (ref 0.76–1.27)
GFR calc Af Amer: 137 mL/min/{1.73_m2} (ref >59)
GFR calc non Af Amer: 118 mL/min/{1.73_m2} (ref >59)
GLOBULIN, TOTAL: 2.5 g/dL (ref 1.5–4.5)
GLUCOSE: 81 mg/dL (ref 65–99)
Potassium: 4.1 mmol/L (ref 3.5–5.2)
SODIUM: 141 mmol/L (ref 134–144)
Total Protein: 6.8 g/dL (ref 6.0–8.5)

## 2018-07-04 LAB — HEPATIC FUNCTION PANEL
ALK PHOS: 70 IU/L (ref 39–117)
ALT: 27 IU/L (ref 0–44)
AST: 22 IU/L (ref 0–40)
Albumin: 4.2 g/dL (ref 3.5–5.5)
BILIRUBIN, DIRECT: 0.11 mg/dL (ref 0.00–0.40)
Bilirubin Total: 0.3 mg/dL (ref 0.0–1.2)
TOTAL PROTEIN: 6.8 g/dL (ref 6.0–8.5)

## 2018-07-04 LAB — CK: Total CK: 106 U/L (ref 24–204)

## 2018-07-31 ENCOUNTER — Ambulatory Visit: Payer: Self-pay | Admitting: Family Medicine

## 2018-08-12 ENCOUNTER — Emergency Department (HOSPITAL_COMMUNITY)
Admission: EM | Admit: 2018-08-12 | Discharge: 2018-08-12 | Disposition: A | Discharge status: Home / Self Care | Payer: Self-pay | Attending: Emergency Medicine | Admitting: Emergency Medicine

## 2018-08-12 ENCOUNTER — Other Ambulatory Visit: Payer: Self-pay

## 2018-08-12 ENCOUNTER — Emergency Department (HOSPITAL_COMMUNITY): Payer: Self-pay

## 2018-08-12 ENCOUNTER — Encounter (HOSPITAL_COMMUNITY): Payer: Self-pay | Admitting: Emergency Medicine

## 2018-08-12 DIAGNOSIS — R109 Unspecified abdominal pain: Secondary | ICD-10-CM | POA: Insufficient documentation

## 2018-08-12 DIAGNOSIS — N50819 Testicular pain, unspecified: Secondary | ICD-10-CM

## 2018-08-12 DIAGNOSIS — N50812 Left testicular pain: Secondary | ICD-10-CM | POA: Insufficient documentation

## 2018-08-12 LAB — URINALYSIS, ROUTINE W REFLEX MICROSCOPIC
BILIRUBIN URINE: NEGATIVE
Glucose, UA: NEGATIVE mg/dL
HGB URINE DIPSTICK: NEGATIVE
Ketones, ur: NEGATIVE mg/dL
Leukocytes, UA: NEGATIVE
Nitrite: NEGATIVE
Protein, ur: NEGATIVE mg/dL
Specific Gravity, Urine: 1.016 (ref 1.005–1.030)
pH: 6 (ref 5.0–8.0)

## 2018-08-12 NOTE — ED Notes (Signed)
The ultrasonographer was just here to perform his examination. She was unable to get the machine to power-up. She is notifying biomed, and will get back to us asap. I have notified Dr. Rubin PayorPickering of this delay.

## 2018-08-12 NOTE — ED Notes (Signed)
Pt. Made aware for the need of urine specimen. 

## 2018-08-12 NOTE — ED Notes (Signed)
Testicular U/S is being performed as I write this. Pt. Remains in no distress.

## 2018-08-12 NOTE — ED Provider Notes (Signed)
[facility]-EMERGENCY DEPT Provider Note   CSN: [id number] Arrival date & time: 08/12/18  1422     History   Chief Complaint Chief Complaint  Patient presents with  . Testicle Pain    HPI Scott Gordon is a 27 y.o. male.  HPI Patient presents with testicular pain.  Had some pain this morning.  Thinks it was because he was laying on his left side of thought was because his topicals were between his leg.  Pain was severe but then improved.  Now has not gone away but still some dull pain.  But on the left side but states it involves the right side a little bit.  No dysuria.  States also will feel it going to his flank at times.  No trauma.  No penile discharge. Past Medical History:  Diagnosis Date  . Bipolar 2 disorder [facility](HCC)     Patient Active Problem List   Diagnosis Date Noted  . Elevated CK 06/14/2018  . Non-traumatic rhabdomyolysis   . Abdominal pain     Past Surgical History:  Procedure Laterality Date  . WISDOM TOOTH EXTRACTION          Home Medications    Prior to Admission medications   Medication Sig Start Date End Date Taking? Authorizing Provider  ibuprofen (ADVIL,MOTRIN) 200 MG tablet Take 400 mg by mouth every 6 (six) hours as needed for headache, mild pain or moderate pain.   Yes [provider]    Family History No family history on file.  Social History Social History   Tobacco Use  . Smoking status: Never Smoker  . Smokeless tobacco: Never Used  Substance Use Topics  . Alcohol use: Not Currently  . Drug use: Yes    Types: Marijuana     Allergies   Pineapple   Review of Systems Review of Systems  Constitutional: Negative for appetite change.  Respiratory: Negative for shortness of breath.   Cardiovascular: Negative for chest pain.  Gastrointestinal: Negative for abdominal distention.  Genitourinary: Positive for flank pain and testicular pain. Negative for dysuria and penile pain.    Musculoskeletal: Negative for back pain.  Skin: Negative for rash.  Neurological: Negative for light-headedness.  Hematological: Negative for adenopathy.  Psychiatric/Behavioral: Negative for confusion.     Physical Exam Updated Vital Signs BP 129/78 (BP Location: Left Arm)   Pulse (!) 107   Temp 98.2 F (36.8 C) (Oral)   Resp 18   Ht 5\' 7"  (1.702 m)   SpO2 98%   BMI 23.18 kg/m   Physical Exam  Constitutional: He appears well-developed.  HENT:  Head: Normocephalic.  Cardiovascular: Normal rate.  Pulmonary/Chest: Effort normal.  Abdominal: There is no tenderness.  Genitourinary: Penis normal.  Genitourinary Comments: Minimal left-sided testicular tenderness.  Cremasteric reflex intact.  No hernia palpated.  Neurological: He is alert.  Skin: Skin is warm.     ED Treatments / Results  Labs (all labs ordered are listed, but only abnormal results are displayed) Labs Reviewed  URINALYSIS, ROUTINE W REFLEX MICROSCOPIC    EKG None  Radiology [place] Scrotum W/doppler  Result Date: 08/12/2018 CLINICAL DATA:  Left testicular pain EXAM: SCROTAL ULTRASOUND DOPPLER ULTRASOUND OF THE TESTICLES TECHNIQUE: Complete ultrasound examination of the testicles, epididymis, and other scrotal structures was performed. Color and spectral Doppler ultrasound were also utilized to evaluate blood flow to the testicles. COMPARISON:  None. FINDINGS: Right testicle Measurements: 3.4 x 2.1 x 2.7 cm. No mass or microlithiasis visualized. Left  testicle Measurements: 4.2 x 2.1 x 2.7 cm. No mass or microlithiasis visualized. Right epididymis:  Normal in size and appearance. Left epididymis:  Normal in size and appearance. Hydrocele:  Trace bilateral hydroceles. Varicocele:  None visualized. Pulsed Doppler interrogation of both testes demonstrates normal low resistance arterial and venous waveforms bilaterally. IMPRESSION: 1. No evidence of testicular torsion.  No testicular mass. Electronically Signed   By:  Elige Ko   On: 08/12/2018 17:38    Procedures Procedures (including critical care time)  Medications Ordered in ED Medications - No data to display   Initial Impression / Assessment and Plan / ED Course  I have reviewed the triage vital signs and the nursing notes.  Pertinent labs & imaging results that were available during my care of the patient were reviewed by me and considered in my medical decision making (see chart for details).     Patient with left testicle pain.  Overall benign exam.  Ultrasound normal.  Urine normal.  Patient feels better.  No specific tenderness.  Discharge home with urology follow-up as needed.  Final Clinical Impressions(s) / ED Diagnoses   Final diagnoses:  Testicle pain    ED Discharge Orders    None       Benjiman Core, MD 08/12/18 1918

## 2018-08-12 NOTE — ED Triage Notes (Signed)
Pt c/o left testicle pain that started this morning when woke up. Denies any swelling. reports that pain while come in waves

## 2018-08-12 NOTE — Discharge Instructions (Addendum)
Watch for fevers or increased pain.  Follow-up with urology as needed.

## 2019-11-21 ENCOUNTER — Ambulatory Visit: Payer: Medicaid Other | Attending: Internal Medicine

## 2019-11-21 DIAGNOSIS — Z23 Encounter for immunization: Secondary | ICD-10-CM

## 2019-11-21 NOTE — Progress Notes (Signed)
   Covid-19 Vaccination Clinic  Name:  Scott Gordon    MRN: [id number] DOB: 05-04-1991  11/21/2019  [name] was observed post Covid-19 immunization for 15 minutes without incident. He was provided with Vaccine Information Sheet and instruction to access the V-Safe system.   [name] was instructed to call 911 with any severe reactions post vaccine: [name] Difficulty breathing  . Swelling of face and throat  . A fast heartbeat  . A bad rash all over body  . Dizziness and weakness   Immunizations Administered    Name Date Dose VIS Date Route   Pfizer COVID-19 Vaccine 11/21/2019 11:17 AM 0.3 mL 08/16/2019 Intramuscular   Manufacturer: [employer], [employer]   Lot: [id number]   NDC: [address]-2

## 2019-12-16 ENCOUNTER — Ambulatory Visit: Payer: Medicaid Other | Attending: Internal Medicine

## 2019-12-16 DIAGNOSIS — Z23 Encounter for immunization: Secondary | ICD-10-CM

## 2019-12-16 NOTE — Progress Notes (Signed)
   Covid-19 Vaccination Clinic  Name:  Scott Gordon    MRN: [id number] DOB: 07-18-1991  12/16/2019  [name] was observed post Covid-19 immunization for 15 minutes without incident. He was provided with Vaccine Information Sheet and instruction to access the V-Safe system.   [name] was instructed to call 911 with any severe reactions post vaccine: [name] Difficulty breathing  . Swelling of face and throat  . A fast heartbeat  . A bad rash all over body  . Dizziness and weakness   Immunizations Administered    Name Date Dose VIS Date Route   Pfizer COVID-19 Vaccine 12/16/2019 10:30 AM 0.3 mL 08/16/2019 Intramuscular   Manufacturer: [employer], [employer]   Lot: [id number]   NDC: [address]-3

## 2020-01-09 ENCOUNTER — Other Ambulatory Visit: Payer: Self-pay

## 2020-01-09 ENCOUNTER — Emergency Department (HOSPITAL_COMMUNITY)
Admission: EM | Admit: 2020-01-09 | Discharge: 2020-01-09 | Disposition: A | Discharge status: Home / Self Care | Payer: Self-pay | Attending: Emergency Medicine | Admitting: Emergency Medicine

## 2020-01-09 ENCOUNTER — Encounter (HOSPITAL_COMMUNITY): Payer: Self-pay | Admitting: Emergency Medicine

## 2020-01-09 DIAGNOSIS — S0990XA Unspecified injury of head, initial encounter: Secondary | ICD-10-CM

## 2020-01-09 DIAGNOSIS — Y999 Unspecified external cause status: Secondary | ICD-10-CM | POA: Insufficient documentation

## 2020-01-09 DIAGNOSIS — Y939 Activity, unspecified: Secondary | ICD-10-CM | POA: Insufficient documentation

## 2020-01-09 DIAGNOSIS — Y929 Unspecified place or not applicable: Secondary | ICD-10-CM | POA: Insufficient documentation

## 2020-01-09 DIAGNOSIS — W2209XA Striking against other stationary object, initial encounter: Secondary | ICD-10-CM | POA: Insufficient documentation

## 2020-01-09 DIAGNOSIS — S0083XA Contusion of other part of head, initial encounter: Secondary | ICD-10-CM

## 2020-01-09 NOTE — ED Triage Notes (Signed)
Patient reports hitting head on shelf today. Reports headache since that time. Denies LOC. Abrasion to right forehead.

## 2020-01-09 NOTE — ED Provider Notes (Signed)
[facility] DEPT Provider Note   CSN: [id number] Arrival date & time: 01/09/20  1611     History Chief Complaint  Patient presents with  . Head Injury    Scott Gordon is a 29 y.o. male.  29 year old male with complaint of headache after hitting his head on a shelf around 11:00 this morning.  Patient denies loss of consciousness at the time, is not anticoagulated.  Patient reports a headache that improved with taking ibuprofen.  Reports intermittent nausea, googled his symptoms and was concerned he may have a concussion.  No other injuries, complaints, concerns.        Past Medical History:  Diagnosis Date  . Bipolar 2 disorder [facility])     Patient Active Problem List   Diagnosis Date Noted  . Elevated CK 06/14/2018  . Non-traumatic rhabdomyolysis   . Abdominal pain     Past Surgical History:  Procedure Laterality Date  . WISDOM TOOTH EXTRACTION         No family history on file.  Social History   Tobacco Use  . Smoking status: Never Smoker  . Smokeless tobacco: Never Used  Substance Use Topics  . Alcohol use: Not Currently  . Drug use: Yes    Types: Marijuana    Home Medications Prior to Admission medications   Medication Sig Start Date End Date Taking? Authorizing Provider  ibuprofen (ADVIL,MOTRIN) 200 MG tablet Take 400 mg by mouth every 6 (six) hours as needed for headache, mild pain or moderate pain.    [provider]    Allergies    Pineapple  Review of Systems   Review of Systems  Constitutional: Negative for fever.  Eyes: Negative for visual disturbance.  Gastrointestinal: Positive for nausea. Negative for vomiting.  Musculoskeletal: Negative for gait problem, neck pain and neck stiffness.  Skin: Positive for wound.  Allergic/Immunologic: Negative for immunocompromised state.  Neurological: Positive for headaches. Negative for dizziness, speech difficulty, weakness and numbness.  All other  systems reviewed and are negative.   Physical Exam Updated Vital Signs BP 119/73   Pulse 73   Temp 98.2 F (36.8 C) (Oral)   Resp 20   SpO2 98%   Physical Exam Vitals and nursing note reviewed.  Constitutional:      General: He is not in acute distress.    Appearance: He is well-developed. He is not diaphoretic.  HENT:     Head: Normocephalic.      Right Ear: Tympanic membrane and ear canal normal.     Left Ear: Tympanic membrane and ear canal normal.     Nose: Nose normal.     Mouth/Throat:     Mouth: Mucous membranes are moist.  Eyes:     Extraocular Movements: Extraocular movements intact.     Pupils: Pupils are equal, round, and reactive to light.  Pulmonary:     Effort: Pulmonary effort is normal.  Musculoskeletal:     Cervical back: Normal range of motion and neck supple. No tenderness.  Skin:    General: Skin is warm and dry.     Findings: No erythema or rash.  Neurological:     Mental Status: He is alert and oriented to person, place, and time.  Psychiatric:        Behavior: Behavior normal.     ED Results / Procedures / Treatments   Labs (all labs ordered are listed, but only abnormal results are displayed) Labs Reviewed - No data to  display  EKG None  Radiology No results found.  Procedures Procedures (including critical care time)  Medications Ordered in ED Medications - No data to display  ED Course  I have reviewed the triage vital signs and the nursing notes.  Pertinent labs & imaging results that were available during my care of the patient were reviewed by me and considered in my medical decision making (see chart for details).  Clinical Course as of Jan 08 1750  Thu Jan 09, 2020  3545 29 year old male with complaint of headache, nausea after hitting his forehead on a shelf this morning.  Headache has improved with ibuprofen.  Exam is unremarkable with exception of small hematoma to right forehead.  Discussed limiting use of TV, cell  phone, computer work.  Recommend Motrin and Tylenol, return to ER for new or worsening symptoms otherwise recheck with PCP as needed.   [LM]    Clinical Course User Index [LM] Alden Hipp   MDM Rules/Calculators/A&P                      Final Clinical Impression(s) / ED Diagnoses Final diagnoses:  Minor head injury, initial encounter  Contusion of face, initial encounter    Rx / DC Orders ED Discharge Orders    None       Jeannie Fend, PA-C 01/09/20 1751    Gwyneth Sprout, MD 01/10/20 0003

## 2020-03-24 ENCOUNTER — Ambulatory Visit (HOSPITAL_COMMUNITY): Payer: Self-pay | Admitting: Licensed Clinical Social Worker

## 2020-04-09 ENCOUNTER — Emergency Department (HOSPITAL_COMMUNITY)
Admission: EM | Admit: 2020-04-09 | Discharge: 2020-04-09 | Disposition: A | Discharge status: Home / Self Care | Payer: BC Managed Care – PPO | Attending: Emergency Medicine | Admitting: Emergency Medicine

## 2020-04-09 ENCOUNTER — Encounter (HOSPITAL_COMMUNITY): Payer: Self-pay

## 2020-04-09 ENCOUNTER — Emergency Department (HOSPITAL_COMMUNITY): Payer: BC Managed Care – PPO

## 2020-04-09 ENCOUNTER — Other Ambulatory Visit: Payer: Self-pay

## 2020-04-09 DIAGNOSIS — Y92000 Kitchen of unspecified non-institutional (private) residence as  the place of occurrence of the external cause: Secondary | ICD-10-CM | POA: Insufficient documentation

## 2020-04-09 DIAGNOSIS — S90111A Contusion of right great toe without damage to nail, initial encounter: Secondary | ICD-10-CM | POA: Insufficient documentation

## 2020-04-09 DIAGNOSIS — S99921A Unspecified injury of right foot, initial encounter: Secondary | ICD-10-CM | POA: Diagnosis present

## 2020-04-09 DIAGNOSIS — W228XXA Striking against or struck by other objects, initial encounter: Secondary | ICD-10-CM | POA: Diagnosis not present

## 2020-04-09 DIAGNOSIS — Y999 Unspecified external cause status: Secondary | ICD-10-CM | POA: Insufficient documentation

## 2020-04-09 DIAGNOSIS — Y93G3 Activity, cooking and baking: Secondary | ICD-10-CM | POA: Diagnosis not present

## 2020-04-09 NOTE — ED Triage Notes (Signed)
Pt states that he dropped a set of drawers on his right big toe at 1245 today. Pt states difficulty bending it. Pt ambulatory.

## 2020-04-09 NOTE — Discharge Instructions (Signed)
You can take Ibuprofen or Tylenol for pain Apply ice as needed for pain and swelling

## 2020-04-09 NOTE — ED Provider Notes (Signed)
[facility]-EMERGENCY DEPT Provider Note   CSN: [id number] Arrival date & time: 04/09/20  1522     History Chief Complaint  Patient presents with  . Toe Pain    Scott Gordon is a 29 y.o. male who presents with toe pain. He dropped a plastic set of drawers on his right great toe earlier today. He estimates the drawers were <10 pounds. Since then he has pain in the right great toe which is worse with ambulation and bending the toe. Pain is better with rest. 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 He has not tried anything for pain. He has been able to ambulate.  HPI     Past Medical History:  Diagnosis Date  . Bipolar 2 disorder [facility])     Patient Active Problem List   Diagnosis Date Noted  . Elevated CK 06/14/2018  . Non-traumatic rhabdomyolysis   . Abdominal pain     Past Surgical History:  Procedure Laterality Date  . WISDOM TOOTH EXTRACTION         History reviewed. No pertinent family history.  Social History   Tobacco Use  . Smoking status: Never Smoker  . Smokeless tobacco: Never Used  Vaping Use  . Vaping Use: Every day  . Substances: Nicotine, Flavoring  Substance Use Topics  . Alcohol use: Not Currently  . Drug use: Yes    Types: Marijuana    Home Medications Prior to Admission medications   Medication Sig Start Date End Date Taking? Authorizing Provider  ibuprofen (ADVIL,MOTRIN) 200 MG tablet Take 400 mg by mouth every 6 (six) hours as needed for headache, mild pain or moderate pain.    [provider]    Allergies    Pineapple  Review of Systems   Review of Systems  Musculoskeletal: Positive for arthralgias.  Skin: Positive for color change and wound.    Physical Exam Updated Vital Signs BP 117/73 (BP Location: Left Arm)   Pulse 83   Temp 98.8 F (37.1 C) (Oral)   Resp 16   Ht 5\' 7"  (1.702 m)   Wt 62.1 kg   SpO2 100%   BMI 21.46 kg/m   Physical Exam Vitals and  nursing note reviewed.  Constitutional:      General: He is not in acute distress.    Appearance: Normal appearance. He is well-developed. He is not ill-appearing.  HENT:     Head: Normocephalic and atraumatic.  Eyes:     General: No scleral icterus.       Right eye: No discharge.        Left eye: No discharge.     Conjunctiva/sclera: Conjunctivae normal.     Pupils: Pupils are equal, round, and reactive to light.  Cardiovascular:     Rate and Rhythm: Normal rate.  Pulmonary:     Effort: Pulmonary effort is normal. No respiratory distress.  Abdominal:     General: There is no distension.  Musculoskeletal:     Cervical back: Normal range of motion.  Skin:    General: Skin is warm and dry.     Comments: There is an abrasion and slight bruising of the right great toe. No obvious deformity and there is minimal tenderness with palpation. Pt is ambulatory.  Neurological:     Mental Status: He is alert and oriented to person, place, and time.  Psychiatric:        Behavior: Behavior normal.  ED Results / Procedures / Treatments   Labs (all labs ordered are listed, but only abnormal results are displayed) Labs Reviewed - No data to display  EKG None  Radiology DG Foot Complete Right  Result Date: 04/09/2020 CLINICAL DATA:  Trauma EXAM: RIGHT FOOT COMPLETE - 3+ VIEW COMPARISON:  None. FINDINGS: There is no evidence of fracture or dislocation. There is no evidence of arthropathy or other focal bone abnormality. Soft tissues are unremarkable. IMPRESSION: No acute osseous abnormality. Electronically Signed   By: Stana Bunting M.D.   On: 04/09/2020 17:39    Procedures Procedures (including critical care time)  Medications Ordered in ED Medications - No data to display  ED Course  I have reviewed the triage vital signs and the nursing notes.  Pertinent labs & imaging results that were available during my care of the patient were reviewed by me and considered in my  medical decision making (see chart for details).  29 year old male presents with right great toe pain after dropping drawers on his toe. There is a small abrasion and bruising of the toe. Xray is negative. Conservative management discussed.  MDM Rules/Calculators/A&P                           Final Clinical Impression(s) / ED Diagnoses Final diagnoses:  Contusion of right great toe without damage to nail, initial encounter    Rx / DC Orders ED Discharge Orders    None       Bethel Born, PA-C 04/09/20 1941    Little, Ambrose Finland, MD 04/09/20 2227

## 2020-04-13 ENCOUNTER — Ambulatory Visit (HOSPITAL_COMMUNITY): Payer: No Payment, Other | Admitting: Psychiatry

## 2020-04-18 ENCOUNTER — Other Ambulatory Visit: Payer: Self-pay

## 2020-04-18 ENCOUNTER — Ambulatory Visit (HOSPITAL_COMMUNITY)
Admission: EM | Admit: 2020-04-18 | Discharge: 2020-04-18 | Disposition: A | Discharge status: Home / Self Care | Payer: BC Managed Care – PPO | Attending: Nurse Practitioner | Admitting: Nurse Practitioner

## 2020-04-18 DIAGNOSIS — F411 Generalized anxiety disorder: Secondary | ICD-10-CM | POA: Diagnosis not present

## 2020-04-18 MED ORDER — GABAPENTIN 100 MG PO CAPS: 100.0000 mg | ORAL_CAPSULE | Freq: Three times a day (TID) | ORAL | 0 refills | Status: DC

## 2020-04-18 MED ORDER — HYDROXYZINE HCL 10 MG PO TABS: 10.0000 mg | ORAL_TABLET | Freq: Three times a day (TID) | ORAL | 0 refills | Status: DC | PRN
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
## 2020-04-18 NOTE — Discharge Instructions (Signed)
Please keep scheduled appointments   Return to [facility] if your condition worsens. Contact 911, go to the nearest emergency department, contact a suicide hotline, or return to [facility] if you develop suicidal thoughts.   See attached for information regarding behavioral health services.

## 2020-04-18 NOTE — ED Provider Notes (Signed)
Behavioral Health Medical Screening Exam  Scott Gordon is a 29 y.o. male who presents voluntarily to [facility] due to worsening anxiety and panic attacks. Patient reports that his anxiety has been worsening over the past weeks and panic attacks have increased in frequency. Reports that during panic attacks he has an internal feeling of anxiety, shortness of breath, increased heart rate, diaphoresis, and feels hot. He reports that he was previously prescribed gabapentin 200 mg TID for anxiety and mood stability. He is interested in restarting gabapentin. States that his previous provider was considering a diagnosis of Bipolar Disorder but he was never formally diagnosed. States that he recently obtained insurance through school. States that he has an upcoming appointment for a psychiatric evaluation at the [facility]. He denies suicidal ideations and homicidal ideations. He denies visual hallucinations. He states that he sometime hear noises when he is a lone, but he is not sure if they are auditory hallucinations. He denies auditory hallucinations in the form of voices.   Total Time spent with patient: 20 minutes   Diagnosis: Generalized Anxiety Disorder  Psychiatric Specialty Exam  Presentation  General Appearance:Appropriate for Environment;Casual;Neat  Eye Contact:Good  Speech:Clear and Coherent;Normal Rate  Speech Volume:Normal  Handedness:No data recorded  Mood and Affect  Mood:Anxious;Depressed  Affect:Congruent   Thought Process  Thought Processes:Coherent  Descriptions of Associations:Intact  Orientation:Full (Time, Place and Person)  Thought Content:WDL  Hallucinations:None  Ideas of Reference:None  Suicidal Thoughts:No  Homicidal Thoughts:No   Sensorium  Memory:Immediate Good;Recent Good;Remote Good  Judgment:Good  Insight:Fair   Executive Functions  Concentration:Good  Attention Span:Good  Recall:Good  Fund of  Knowledge:Good  Language:Good   Psychomotor Activity  Psychomotor Activity:Normal   Assets  Assets:Communication Skills;Desire for Improvement;Financial Resources/Insurance;Housing;Leisure Time;Physical Health   Sleep  Sleep:Fair  Number of hours: No data recorded  Physical Exam: Physical Exam Constitutional:      General: He is not in acute distress.    Appearance: He is not ill-appearing, toxic-appearing or diaphoretic.  HENT:     Head: Normocephalic.     Right Ear: External ear normal.     Left Ear: External ear normal.  Eyes:     Pupils: Pupils are equal, round, and reactive to light.  Cardiovascular:     Rate and Rhythm: Normal rate.  Pulmonary:     Effort: Pulmonary effort is normal. No respiratory distress.  Musculoskeletal:        General: Normal range of motion.  Neurological:     Mental Status: He is alert and oriented to person, place, and time.  Psychiatric:        Mood and Affect: Mood is anxious and depressed.        Speech: Speech normal.        Behavior: Behavior is cooperative.        Thought Content: Thought content is not paranoid or delusional. Thought content does not include homicidal or suicidal ideation. Thought content does not include suicidal plan.    Review of Systems  Constitutional: Negative for chills, diaphoresis, fever, malaise/fatigue and weight loss.  Respiratory: Negative for cough and shortness of breath.   Cardiovascular: Negative for chest pain.  Gastrointestinal: Negative for diarrhea, nausea and vomiting.  Psychiatric/Behavioral: Positive for depression. Negative for hallucinations, memory loss, substance abuse and suicidal ideas. The patient is nervous/anxious and has insomnia.    Blood pressure 116/83, pulse 69, temperature 98.2 F (36.8 C), temperature source Oral, resp. rate 18, SpO2 99 %. There is no  height or weight on file to calculate BMI.  Musculoskeletal: Strength & Muscle Tone: within normal limits Gait &  Station: normal Patient leans: N/A   Recommendations:  Based on my evaluation the patient does not appear to have an emergency medical condition.   Disposition: No evidence of imminent risk to self or others at present.   Patient does not meet criteria for psychiatric inpatient admission. Supportive therapy provided about ongoing stressors. Discussed crisis plan, support from social network, calling 911, coming to the Emergency Department, and calling Suicide Hotline. Patient encouraged to keep appointment with [facility].   Provided prescriptions for gabapentin 100 mg TID for anxiety #90 and hydroxyzine 10 mg TID prn for anxiety #30. Reviewed medication side effects.     Jackelyn Poling, NP 04/18/2020, 3:24 AM

## 2020-04-18 NOTE — ED Notes (Signed)
Pt belongings in locker #9. [employer], phone, wallet and keys)

## 2020-05-02 ENCOUNTER — Ambulatory Visit (HOSPITAL_COMMUNITY)
Admission: EM | Admit: 2020-05-02 | Discharge: 2020-05-02 | Disposition: A | Discharge status: Home / Self Care | Payer: BC Managed Care – PPO | Attending: Behavioral Health | Admitting: Behavioral Health

## 2020-05-02 ENCOUNTER — Other Ambulatory Visit: Payer: Self-pay

## 2020-05-02 MED ORDER — GABAPENTIN 100 MG PO CAPS: 200.0000 mg | ORAL_CAPSULE | Freq: Three times a day (TID) | ORAL | 0 refills | Status: DC

## 2020-05-02 NOTE — ED Notes (Signed)
Pt. Phone is stored in locker number 13

## 2020-05-02 NOTE — Discharge Instructions (Signed)
Continue prescribed medication as order.

## 2020-06-03 ENCOUNTER — Ambulatory Visit (INDEPENDENT_AMBULATORY_CARE_PROVIDER_SITE_OTHER): Payer: No Payment, Other | Admitting: Licensed Clinical Social Worker

## 2020-06-03 ENCOUNTER — Other Ambulatory Visit: Payer: Self-pay

## 2020-06-03 DIAGNOSIS — F341 Dysthymic disorder: Secondary | ICD-10-CM | POA: Insufficient documentation

## 2020-06-03 DIAGNOSIS — F331 Major depressive disorder, recurrent, moderate: Secondary | ICD-10-CM | POA: Diagnosis not present

## 2020-06-03 DIAGNOSIS — F411 Generalized anxiety disorder: Secondary | ICD-10-CM | POA: Insufficient documentation
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
## 2020-06-18 ENCOUNTER — Other Ambulatory Visit: Payer: Self-pay

## 2020-06-18 ENCOUNTER — Ambulatory Visit (INDEPENDENT_AMBULATORY_CARE_PROVIDER_SITE_OTHER): Payer: No Payment, Other | Admitting: Licensed Clinical Social Worker

## 2020-06-18 DIAGNOSIS — F331 Major depressive disorder, recurrent, moderate: Secondary | ICD-10-CM | POA: Diagnosis not present

## 2020-06-18 DIAGNOSIS — F411 Generalized anxiety disorder: Secondary | ICD-10-CM

## 2020-06-18 NOTE — Progress Notes (Signed)
   THERAPIST PROGRESS NOTE  Session Time: 20  Therapist Response:   Subjective/Objective: Pt is alert and oriented x 5. He was dressed casually and engaged well throughout therapy as evidence by note below. Pt presented with flat & depressed mood/affect. He was cooperative and maintained good eye contact in session.   Pt presented today with family as primary stressor. He reports that his mother has been Dx with cancer for over a year. The doctors did not think chemo would be needed. Pt received a call from his sister updating him by saying that his mother white blood cell count was low. This has caused pt to go into "a downward spiral". Pt reports lack of motivation and for the past 5 days has isolated himself in his room. He states that he has run out of his medications and is attempting to get bridge medications from [facility] which he states, "It is easier said than done". Pt will f/u with his pharmacy after session.   Pt states that one good support he has had is his partners [place]. Pt Is polyamorous and has 2 partners. His sexual orientation is demisexual "straight leaning".  Scott Gordon reports that they have solid communication and have really helped him get through his stress.    Assessment/plan:  Pt endorses symptoms for depression and anxiety as sadness, irritability, lack of concentration, insomnia, tension, and worry. Pt does meet criteria for Major depression and GAD. He is taking hydrazine and gabapentin. [place] Gabapentin has run out and he is attempting to bridge the medication until he can be seen by [facility]. Plan moving forward for therapy: Pt to write down goal he has over the next 6 months then prioritize them and pt will also need to speak with his mother about her Cancer Dx to better comprehend and process through stress. Participation Level: Active  Behavioral Response: Casual and Fairly GroomedAlertDepressed  Type of Therapy: Individual Therapy  Treatment Goals  addressed: Diagnosis: Depression   Interventions: CBT and Supportive  Summary: Scott Gordon is a 29 y.o. male who presents with  Major depression.   Suicidal/Homicidal: Nowithout intent/plan   Plan: Return again in 2 weeks.      Weber Cooks, LCSW 06/18/2020

## 2020-07-01 ENCOUNTER — Ambulatory Visit (INDEPENDENT_AMBULATORY_CARE_PROVIDER_SITE_OTHER): Payer: No Payment, Other | Admitting: Licensed Clinical Social Worker

## 2020-07-01 ENCOUNTER — Other Ambulatory Visit: Payer: Self-pay

## 2020-07-01 DIAGNOSIS — F331 Major depressive disorder, recurrent, moderate: Secondary | ICD-10-CM | POA: Diagnosis not present

## 2020-07-01 DIAGNOSIS — F411 Generalized anxiety disorder: Secondary | ICD-10-CM

## 2020-07-01 NOTE — Progress Notes (Addendum)
   THERAPIST PROGRESS NOTE  Session Time: 24   Therapist Response:    Subjective/Objective:  Pt was alert and oriented x 5. He was dressed casually and engaged well throughout therapy session. Pt presented today with euthymic mood/affect. Scott Gordon had good eye contact and was cooperative.   Pt reports today that primary stressors are school, financials, and work. Scott Gordon started out session by stating "I maybe a little more upbeat than usual". LCSW asked pt why? Pt reports that he was taking gabapentin but has been unsuccessful with getting it bridged as [facility] has already done this once before for pt. He went onto to state that he may have been going through withdrawal from it. LCSW did direct his medication question to his provider appointment tomorrow. Scott Gordon was agreeable that plan.   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 LCSW used solution focused therapy to have pt write a pros/cons list of going back to school vs work. He was agreeable to this plan. He also reports that his financial situation has been poor lately. He was supposed to go on a mini vacation with one of his partners. That was not able to happen and pt had already taken off from work because of this he had only 1 shift on his last paycheck LCSW used supportive therapy and pt reports that he is increasing his shifts the next three week to help balance his financial again.   Scott Gordon reports that he received a call from his father. Father was inquiring about how pt has been. Scott Gordon has been hesitant about reaching out to his father because he states that his dad does not believe in mental health. Scott Gordon did attempt to explain that he not suicidal to his father some days he just wishes he would not wake up. Scott Gordon was unable to get into detail about this with his dad because he was at work, but plan on following up on this in the next day or  two.    Assessment/Plan: Pt endorses symptoms for depression and anxiety for hopelessness, worthlessness, irritability, tension, and worry. He currently meets criteria for GAD and MDD. Scott Gordon is not on medication but does have an eval scheduled tomorrow with [facility]. Plan for therapy is to 1. Write pro/cons list about "school vs. Starting up business" 2. Journal entry about his perspective of how the conversation with his father goes 3. working hours to stabilize financials situation. Increase working hours to stabilize financials situation. Scott Gordon is agreeable to this plan and will f/u in 2 weeks.    Participation Level: Active  Behavioral Response: CasualAlertEuthymic  Type of Therapy: Individual Therapy  Treatment Goals addressed: Anxiety and Diagnosis: MDD   Interventions: CBT and Supportive  Summary: Scott Gordon is a 29 y.o. male who presents with MDD & GAD.   Suicidal/Homicidal: Nowithout intent/plan    Plan: Return again in 2 weeks.      Weber Cooks, LCSW 07/01/2020

## 2020-07-02 ENCOUNTER — Encounter (HOSPITAL_COMMUNITY): Payer: Self-pay | Admitting: Psychiatry

## 2020-07-02 ENCOUNTER — Ambulatory Visit (INDEPENDENT_AMBULATORY_CARE_PROVIDER_SITE_OTHER): Payer: No Payment, Other | Admitting: Psychiatry

## 2020-07-02 DIAGNOSIS — F331 Major depressive disorder, recurrent, moderate: Secondary | ICD-10-CM

## 2020-07-02 DIAGNOSIS — F411 Generalized anxiety disorder: Secondary | ICD-10-CM

## 2020-07-02 MED ORDER — TRAZODONE HCL 50 MG PO TABS: 50.0000 mg | ORAL_TABLET | Freq: Every evening | ORAL | 2 refills | Status: DC | PRN

## 2020-07-02 MED ORDER — FLUOXETINE HCL 10 MG PO CAPS: 10.0000 mg | ORAL_CAPSULE | Freq: Every day | ORAL | 2 refills | Status: DC

## 2020-07-02 MED ORDER — HYDROXYZINE HCL 10 MG PO TABS: 10.0000 mg | ORAL_TABLET | Freq: Three times a day (TID) | ORAL | 2 refills | Status: DC | PRN

## 2020-07-02 NOTE — Progress Notes (Signed)
Psychiatric Initial Adult Assessment   Patient Identification: Scott Gordon MRN:  [id number] Date of Evaluation:  07/02/2020  Referral Source:[facility]  Chief Complaint:   29 year old male seen today for initial psychiatric evaluation. He was referred here by [facility] where he was seen on 04/18/2020 and 04/28/2020.He has a psychiatric history of depression and anxiety.  He has previously taken gabapentin 200 mg three times daily for anxiety but is no longer taking it. He was given hydroxyzine at [facility] and he has only taken it a few times to help him sleep.   Today he is well groomed, pleasant, cooperative, engaged in conversation and maintained eye contact. He describes his mood as more depressed than anxious. A GAD-7 was completed today, and patient scored 14. A PHQ-9 was completed, and patient scored 16.  He reports he has run out of the gabapentin, so he is no longer taking it. He reports he felt the same way he did while he was on the medication as he does now after not taking it. He states the medication helped when he started taking it but no longer helping him. He admits his depression has worsened and is currently worse than the anxiety. He reports he is sleeping 6 hours per night and has a very difficult time falling asleep. He states he is eating two small meals daily and has a poor appetite. He admits a history of traumatic events however notes he did not want to discuss it. He states he has noticed he is more irritable and agitated. He states he occasionally feels worthless and has difficulty with his memory. He admits to having panic attacks with his anxiety. He becomes more anxious in social settings because he is afraid he might embarrass himself. He denies paranoia.  At times he notes that he has elevated mood, is easily distracted, and sometimes he feels like he can do things that sometimes other people can't do. He denies SI/HI/VAH or paranoia.   He admits to trying other medications  before the gabapentin and hydroxyzine. He states he has taken Wellbutrin which made him feel numb, and he stopped taking it because of issues with his memory. He also states he has taken Lexapro in the past but doesn't remember how he felt while taking it or why he stopped taking it. He admits he has seen a psychiatrist in the past who was evaluating him for bipolar disorder but stopped seeing him shortly after the evaluation.    Patient agrees to starting Prozac 10 mg daily. He will also  start Trazodone 50mg  PRN at night to help him fall asleep.  He will discontinue  And continue all other medications as prescribed. Potential side effects of medication and risks vs benefits of treatment vs non-treatment were explained and discussed. All questions were answered. He will follow up with   Visit Diagnosis:    ICD-10-CM   1. GAD (generalized anxiety disorder)  F41.1 FLUoxetine (PROZAC) 10 MG capsule    hydrOXYzine (ATARAX/VISTARIL) 10 MG tablet    DISCONTINUED: hydrOXYzine (ATARAX/VISTARIL) 10 MG tablet  2. Moderate episode of recurrent major depressive disorder (HCC)  F33.1 FLUoxetine (PROZAC) 10 MG capsule    traZODone (DESYREL) 50 MG tablet    History of Present Illness:  depression and anxiety  Associated Signs/Symptoms: Depression Symptoms:  depressed mood, anhedonia, psychomotor agitation, fatigue, feelings of worthlessness/guilt, difficulty concentrating, hopelessness, impaired memory, anxiety, panic attacks, loss of energy/fatigue, decreased appetite, (Hypo) Manic Symptoms:  Distractibility, Elevated Mood, Grandiosity, Anxiety Symptoms:  Excessive  Worry, Panic Symptoms, Social Anxiety, Psychotic Symptoms:  Denies PTSD Symptoms: Had a traumatic exposure:  Notes that he has had trauma however did not want to discuss  Past Psychiatric History: Depression and anxiety Previous Psychotropic Medications: Gabapentin, hydroxyzine, wellbutrin (caused poor memory), and  lexapro  Substance Abuse History in the last 12 months:  Yes.    Consequences of Substance Abuse: NA  Past Medical History:  Past Medical History:  Diagnosis Date  . Bipolar 2 disorder [facility])     Past Surgical History:  Procedure Laterality Date  . WISDOM TOOTH EXTRACTION      Family Psychiatric History: Notes that his family has mental health conditions but they are not diagnosed.   Family History: No family history on file.  Social History:   Social History   Socioeconomic History  . Marital status: Significant Other    Spouse name: Not on file  . Number of children: Not on file  . Years of education: Not on file  . Highest education level: Not on file  Occupational History  . Not on file  Tobacco Use  . Smoking status: Never Smoker  . Smokeless tobacco: Never Used  Vaping Use  . Vaping Use: Every day  . Substances: Nicotine, Flavoring  Substance and Sexual Activity  . Alcohol use: Not Currently  . Drug use: Yes    Types: Marijuana  . Sexual activity: Not on file  Other Topics Concern  . Not on file  Social History Narrative  . Not on file   Social Determinants of Health   Financial Resource Strain: Medium Risk  . Difficulty of Paying Living Expenses: Somewhat hard  Food Insecurity: No Food Insecurity  . Worried About Programme researcher, broadcasting/film/video in the Last Year: Never true  . Ran Out of Food in the Last Year: Never true  Transportation Needs: No Transportation Needs  . Lack of Transportation (Medical): No  . Lack of Transportation (Non-Medical): No  Physical Activity: Insufficiently Active  . Days of Exercise per Week: 1 day  . Minutes of Exercise per Session: 30 min  Stress: Stress Concern Present  . Feeling of Stress : Very much  Social Connections: Socially Isolated  . Frequency of Communication with Friends and Family: Twice a week  . Frequency of Social Gatherings with Friends and Family: Three times a week  . Attends Religious Services: Never  .  Active Member of Clubs or Organizations: No  . Attends Banker Meetings: Never  . Marital Status: Never married    Additional Social History: Patient resides in [place]. He is in a relationship (for 5 months). He has no children. He works at [employer]. He endorse using marijuana a couple time a week. He denies tobacco or illegal drug use. He notes he drinks socially.   Allergies:   Allergies  Allergen Reactions  . Pineapple Hives    Metabolic Disorder Labs: No results found for: HGBA1C, MPG No results found for: PROLACTIN No results found for: CHOL, TRIG, HDL, CHOLHDL, VLDL, LDLCALC No results found for: TSH  Therapeutic Level Labs: No results found for: LITHIUM No results found for: CBMZ No results found for: VALPROATE  Current Medications: Current Outpatient Medications  Medication Sig Dispense Refill  . FLUoxetine (PROZAC) 10 MG capsule Take 1 capsule (10 mg total) by mouth daily. 30 capsule 2  . hydrOXYzine (ATARAX/VISTARIL) 10 MG tablet Take 1 tablet (10 mg total) by mouth 3 (three) times daily as needed for anxiety. 90 tablet 2  .  ibuprofen (ADVIL,MOTRIN) 200 MG tablet Take 400 mg by mouth every 6 (six) hours as needed for headache, mild pain or moderate pain.    . traZODone (DESYREL) 50 MG tablet Take 1 tablet (50 mg total) by mouth at bedtime as needed for sleep. 30 tablet 2   No current facility-administered medications for this visit.    Musculoskeletal: Strength & Muscle Tone: within normal limits Gait & Station: normal Patient leans: N/A  Psychiatric Specialty Exam: Review of Systems  There were no vitals taken for this visit.There is no height or weight on file to calculate BMI.  General Appearance: Well Groomed  Eye Contact:  Good  Speech:  Clear and Coherent and Normal Rate  Volume:  Normal  Mood:  Anxious and Depressed  Affect:  Appropriate  Thought Process:  Coherent, Goal Directed and Linear  Orientation:  Full (Time, Place, and  Person)  Thought Content:  WDL and Logical  Suicidal Thoughts:  No  Homicidal Thoughts:  No  Memory:  Immediate;   Good Recent;   Good Remote;   Good  Judgement:  Good  Insight:  Good  Psychomotor Activity:  Normal  Concentration:  Concentration: Good and Attention Span: Good  Recall:  Good  Fund of Knowledge:Good  Language: Good  Akathisia:  No  Handed:  Right  AIMS (if indicated):  Not done  Assets:  Communication Skills Desire for Improvement Financial Resources/Insurance Housing Social Support  ADL's:  Intact  Cognition: WNL  Sleep:  Fair   Screenings: AUDIT     Counselor from [facility]/03/2020 in [facility]  Alcohol Use Disorder Identification Test Final Score (AUDIT) 4    GAD-7     Office Visit from 07/02/2020 in [facility] Counselor from 06/03/2020 in [facility] Office Visit from 07/03/2018 in [facility]  Total GAD-7 Score 14 16 10     PHQ2-9     Office Visit from 07/02/2020 in [facility] Counselor from 06/03/2020 in [facility] Office Visit from 07/03/2018 in [facility]  PHQ-2 Total Score 4 5 5   PHQ-9 Total Score 16 20 17       Assessment and Plan: Patient endorses symptoms of anxiety and depression. He notes Gabapentin has been ineffective and informed [facility] that he discontinued it. He is agreeable to starting Prozac 10 mg daily to help manage symptoms of anxiety and depression. He is also agreeable to start trazodone 25 mg-50 mg as needed for sleep. He will continue all other medications as prescribed.   1. GAD (generalized anxiety disorder)  Start- FLUoxetine (PROZAC) 10 MG capsule; Take 1 capsule (10 mg total) by mouth daily.  Dispense: 30 capsule; Refill: 2 Continue- hydrOXYzine (ATARAX/VISTARIL) 10 MG tablet; Take 1 tablet (10 mg total) by mouth 3 (three)  times daily as needed for anxiety.  Dispense: 90 tablet; Refill: 2  2. Moderate episode of recurrent major depressive disorder (HCC)  Start- FLUoxetine (PROZAC) 10 MG capsule; Take 1 capsule (10 mg total) by mouth daily.  Dispense: 30 capsule; Refill: 2 Start- traZODone (DESYREL) 50 MG tablet; Take 1 tablet (50 mg total) by mouth at bedtime as needed for sleep.  Dispense: 30 tablet; Refill: 2  Follow up in 3 mouth  Follow up with with threapy    , NP 10/28/20219:13 AM

## 2020-07-22 ENCOUNTER — Telehealth (HOSPITAL_COMMUNITY): Payer: Self-pay | Admitting: Psychiatry

## 2020-07-22 ENCOUNTER — Other Ambulatory Visit (HOSPITAL_COMMUNITY): Payer: Self-pay | Admitting: Psychiatry

## 2020-07-22 ENCOUNTER — Other Ambulatory Visit: Payer: Self-pay

## 2020-07-22 ENCOUNTER — Ambulatory Visit (INDEPENDENT_AMBULATORY_CARE_PROVIDER_SITE_OTHER): Payer: No Payment, Other | Admitting: Licensed Clinical Social Worker

## 2020-07-22 DIAGNOSIS — F331 Major depressive disorder, recurrent, moderate: Secondary | ICD-10-CM

## 2020-07-22 DIAGNOSIS — F411 Generalized anxiety disorder: Secondary | ICD-10-CM

## 2020-07-22 MED ORDER — FLUOXETINE HCL 20 MG PO CAPS: 20.0000 mg | ORAL_CAPSULE | Freq: Every day | ORAL | 2 refills | Status: DC

## 2020-07-22 NOTE — Telephone Encounter (Signed)
I have called him twice today to discuss his concern with new medicine for anxiety but I have not gotten an answer.

## 2020-07-22 NOTE — Progress Notes (Signed)
   THERAPIST PROGRESS NOTE  Session Time: 49  Therapist Response:   Subjective/Objective:   Pt was alert and oriented x 5. He was dressed casually and engaged well throughout f/u therapy session. Scott Gordon presented today with euthymic mood/affect.   Primary stressor today is increased anxiety. Pt stated that he has reached out to medication provider to be put on an everyday anxiety medication. He is currently in a major transition. Pt has started a 2nd job at an [employer]. 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 LCSW offered supportive therapy for primary intervention, which pt reported relief from.   He is also having some family conflict with his mother and father. Pt mother is currently Dx with cancer but does not talk to pt much about it. Scott Gordon has started to become more involved with his mother radiation treatment which he states helps his overall mental health. Scott Gordon's father and pt have had a conversation about his mental health. Pt father does not believe in mental health and pt has been wanting to have an open conversation about it with his dad. Scott Gordon did have that talk with his father and stated it went well.    Assessment/Plan:  Pt endorse anxiety symptoms for tension, worry, restlessness, and poor concertation. He does meet criteria for GAD. He continues to take his medications as prescribed. Pt plan moving forward will be to continue to work on coping skills to better manage the transition that pt is currently going through to help decrease his anxiety  Participation Level: Active  Behavioral Response: Casual and Fairly GroomedAlertEuthymic  Type of Therapy: Individual Therapy  Treatment Goals addressed: Diagnosis: Depression and anxiety   Interventions: Supportive  Summary: Scott Gordon is a 29 y.o. male who presents with MDD and GAD.   Suicidal/Homicidal: NAwithout intent/plan   Plan: Return again in 8 weeks. Pt needed to reschedule previously scheduled appointment due to new work schedule.      Weber Cooks, LCSW 07/22/2020

## 2020-07-22 NOTE — Telephone Encounter (Signed)
Provider contacted patient and was informed that since stopping gabapentin he has noticed more anxiety.  He informed provider that he has only been taking hydroxyzine at night as needed.  Provider recommended taking hydroxyzine 10 mg 3 times daily as needed.  Provider also recommended increasing Prozac 10 mg to 20 mg to help manage anxiety and depression.  Patient agreeable to recommendations. Potential side effects of medication and risks vs benefits of treatment vs non-treatment were explained and discussed. All questions were answered.  No other concerns noted at this time.

## 2020-07-24 ENCOUNTER — Other Ambulatory Visit (HOSPITAL_COMMUNITY): Payer: Self-pay | Admitting: Psychiatry

## 2020-07-24 DIAGNOSIS — F411 Generalized anxiety disorder: Secondary | ICD-10-CM

## 2020-07-24 DIAGNOSIS — F331 Major depressive disorder, recurrent, moderate: Secondary | ICD-10-CM

## 2020-08-17 ENCOUNTER — Other Ambulatory Visit (HOSPITAL_COMMUNITY): Payer: Self-pay | Admitting: Psychiatry

## 2020-08-17 DIAGNOSIS — F411 Generalized anxiety disorder: Secondary | ICD-10-CM

## 2020-08-17 DIAGNOSIS — F331 Major depressive disorder, recurrent, moderate: Secondary | ICD-10-CM

## 2020-08-18 ENCOUNTER — Other Ambulatory Visit (HOSPITAL_COMMUNITY): Payer: Self-pay | Admitting: Psychiatry

## 2020-08-18 DIAGNOSIS — F411 Generalized anxiety disorder: Secondary | ICD-10-CM

## 2020-08-18 DIAGNOSIS — F331 Major depressive disorder, recurrent, moderate: Secondary | ICD-10-CM

## 2020-08-19 ENCOUNTER — Ambulatory Visit (HOSPITAL_COMMUNITY): Payer: BC Managed Care – PPO | Admitting: Licensed Clinical Social Worker

## 2020-08-26 ENCOUNTER — Other Ambulatory Visit (HOSPITAL_COMMUNITY): Payer: Self-pay | Admitting: Psychiatry

## 2020-08-26 DIAGNOSIS — F331 Major depressive disorder, recurrent, moderate: Secondary | ICD-10-CM

## 2020-08-26 DIAGNOSIS — F411 Generalized anxiety disorder: Secondary | ICD-10-CM

## 2020-09-18 ENCOUNTER — Ambulatory Visit (HOSPITAL_COMMUNITY): Payer: No Payment, Other | Admitting: Licensed Clinical Social Worker

## 2020-10-01 ENCOUNTER — Ambulatory Visit (HOSPITAL_COMMUNITY): Payer: No Payment, Other | Admitting: Licensed Clinical Social Worker

## 2020-10-02 ENCOUNTER — Other Ambulatory Visit: Payer: Self-pay

## 2020-10-02 ENCOUNTER — Telehealth (HOSPITAL_COMMUNITY): Payer: No Payment, Other | Admitting: Psychiatry

## 2020-10-28 ENCOUNTER — Ambulatory Visit (HOSPITAL_COMMUNITY): Payer: No Payment, Other | Admitting: Licensed Clinical Social Worker

## 2020-12-29 ENCOUNTER — Other Ambulatory Visit (HOSPITAL_COMMUNITY): Payer: Self-pay | Admitting: Psychiatry

## 2020-12-29 ENCOUNTER — Telehealth (HOSPITAL_COMMUNITY): Payer: Self-pay | Admitting: Psychiatry

## 2020-12-29 DIAGNOSIS — F331 Major depressive disorder, recurrent, moderate: Secondary | ICD-10-CM

## 2020-12-29 DIAGNOSIS — F411 Generalized anxiety disorder: Secondary | ICD-10-CM

## 2020-12-29 MED ORDER — FLUOXETINE HCL 20 MG PO CAPS: ORAL_CAPSULE | ORAL | 2 refills | Status: DC

## 2020-12-29 MED ORDER — HYDROXYZINE HCL 10 MG PO TABS: 10.0000 mg | ORAL_TABLET | Freq: Three times a day (TID) | ORAL | 2 refills | Status: DC | PRN

## 2020-12-29 MED ORDER — TRAZODONE HCL 50 MG PO TABS: 50.0000 mg | ORAL_TABLET | Freq: Every evening | ORAL | 1 refills | Status: DC | PRN

## 2020-12-29 NOTE — Telephone Encounter (Signed)
Pt trying to apply for leave at work and would like to talk with you about intensive therapy.  Also pt needs to have proof he was referred for intensive OP therapy.  Pt ph [phone] Pt given appt 02/05/21 with Dr. [name].

## 2020-12-29 NOTE — Telephone Encounter (Signed)
Patient notes that he has been out of his medication for over a month.  He informed provider that he feels like he is spiraling out of control and would like to restart his medications.  He notes that he is attempting to take leave from work and reports that he fears that he may lose his job.  He requested provider write him a letter requesting time off.  Provider informed patient that he has not been seen since November 2021 due to him not showing up for his appointments.  At this time provide her informed patient that she could not recommend that he be out of work at this time.  Provider did inform patient that she could write a letter stating that he has reestablished care with [facility] and is currently restarting his medication.  Patient endorsed understanding and agreed.  Today provider reordered trazodone 50 mg, hydroxyzine 10 mg 3 times daily, and Prozac 20 mg to help manage anxiety and depression.  Writer also wrote a letter detailing the above.  No other concerns noted at this time.

## 2021-01-21 ENCOUNTER — Other Ambulatory Visit (HOSPITAL_COMMUNITY): Payer: Self-pay | Admitting: Psychiatry

## 2021-01-21 DIAGNOSIS — F411 Generalized anxiety disorder: Secondary | ICD-10-CM

## 2021-02-03 ENCOUNTER — Ambulatory Visit (HOSPITAL_COMMUNITY): Payer: No Payment, Other | Admitting: Licensed Clinical Social Worker

## 2021-02-04 ENCOUNTER — Ambulatory Visit (HOSPITAL_COMMUNITY)
Admission: EM | Admit: 2021-02-04 | Discharge: 2021-02-04 | Disposition: A | Discharge status: Home / Self Care | Payer: No Payment, Other | Attending: Psychiatry | Admitting: Psychiatry

## 2021-02-04 ENCOUNTER — Other Ambulatory Visit: Payer: Self-pay

## 2021-02-04 DIAGNOSIS — Z79899 Other long term (current) drug therapy: Secondary | ICD-10-CM | POA: Insufficient documentation

## 2021-02-04 DIAGNOSIS — F331 Major depressive disorder, recurrent, moderate: Secondary | ICD-10-CM | POA: Insufficient documentation

## 2021-02-04 DIAGNOSIS — F411 Generalized anxiety disorder: Secondary | ICD-10-CM | POA: Insufficient documentation

## 2021-02-04 DIAGNOSIS — Z9114 Patient's other noncompliance with medication regimen: Secondary | ICD-10-CM | POA: Insufficient documentation

## 2021-02-04 DIAGNOSIS — Z596 Low income: Secondary | ICD-10-CM | POA: Insufficient documentation

## 2021-02-04 NOTE — Discharge Instructions (Addendum)

## 2021-02-04 NOTE — BH Assessment (Addendum)
Triage Note- ROUTINE- Pt states he has been feeling anxious and fearful and that he needs "help to function day to day". Pt denies SI, HI and AVH. He reports he recently possibly had AH during a panic attack- hearing a group of people trying to wake him (he is unsure if used thc prior to [facility]). He reports daily thc use. He is hoping to receive recommendation for coming to hospital or increasing therapy. Pt states he sees therapist upstairs & pt has a good rapport with him.Pt has not been to therapy as often recently- due to his own scheduling issues.

## 2021-02-04 NOTE — ED Notes (Signed)
Patient received After Visit Summary with follow up instructions. Belongings from [facility] locker given back to patient.

## 2021-02-04 NOTE — ED Provider Notes (Signed)
Behavioral Health Urgent Care Medical Screening Exam  Patient Name: Scott Gordon MRN: [id number] Date of Evaluation: 02/04/21 Chief Complaint:   Diagnosis:  Final diagnoses:  Moderate episode of recurrent major depressive disorder (HCC)  GAD (generalized anxiety disorder)    History of Present illness: Scott Gordon is a 30 y.o. male. Patient reports that he came in today because he wants a referral to [facility]. He states that he is seeing Dr. [name] tomorrow, but felt that he needed come in today. He denies nay suicidal or homicidal ideations and denies any hallucinations. He reports that he is prescribed Prozac 20 mg PO Daily, Vistaril 25 mg PO TID PRN, and Trazodone 10 mg PO QHS PRN. He doesn't feel the medications are really working, but he admits to noncompliance with his medications.  He reports that he may miss some doses from time to time.  He reports having multiple stressors in his life and include financial stressors and that he only makes enough Scott Gordon to barely cover his meals, his mom has been getting treatment for stage IV cancer and that if she gets worse he does realize that he will have to be the one that is taking care of her because his older sister has trouble taking care of herself.  He states that all over and has been just kind of overwhelming at times.  He states that he deals with disassociation at times when he has panic attacks and anxiety.  He states that 2 weeks ago he was having a dissociated episode and could hear a voice yelling at him.  He states this is the first time he has ever had auditory hallucinations.  He reports that he plans to follow the instructions of Dr. [name] but is interested in other medications.  Patient was informed of possibly being started on an additional antidepressant such as Wellbutrin, increasing his Prozac dose, or starting a low-dose antipsychotic such as Abilify or Seroquel.  Patient states that he is open to any options.  Referral  was made for the patient to PHP.  Patient was again encouraged to follow-up with Dr. [name] tomorrow morning.  Psychiatric Specialty Exam  Presentation  General Appearance:Appropriate for Environment; Casual; Well Groomed  Eye Contact:Good  Speech:Clear and Coherent; Normal Rate  Speech Volume:Normal  Handedness:Right   Mood and Affect  Mood:Anxious; Depressed  Affect:Appropriate; Congruent   Thought Process  Thought Processes:Coherent  Descriptions of Associations:Intact  Orientation:Full (Time, Place and Person)  Thought Content:WDL    Hallucinations:None  Ideas of Reference:None  Suicidal Thoughts:No  Homicidal Thoughts:No   Sensorium  Memory:Immediate Good; Recent Good; Remote Good  Judgment:Good  Insight:Fair   Executive Functions  Concentration:Good  Attention Span:Good  Recall:Good  Fund of Knowledge:Good  Language:Good   Psychomotor Activity  Psychomotor Activity:Normal   Assets  Assets:Communication Skills; Desire for Improvement; Housing; Physical Health; Social Support; Transportation   Sleep  Sleep:Fair  Number of hours: No data recorded  No data recorded  Physical Exam: Physical Exam Vitals and nursing note reviewed.  Constitutional:      Appearance: He is well-developed.  HENT:     Head: Normocephalic.  Eyes:     Pupils: Pupils are equal, round, and reactive to light.  Cardiovascular:     Rate and Rhythm: Normal rate.  Pulmonary:     Effort: Pulmonary effort is normal.  Musculoskeletal:        General: Normal range of motion.  Neurological:     Mental Status: He is alert and  oriented to person, place, and time.    Review of Systems  Constitutional: Negative.   HENT: Negative.   Eyes: Negative.   Respiratory: Negative.   Cardiovascular: Negative.   Gastrointestinal: Negative.   Genitourinary: Negative.   Musculoskeletal: Negative.   Skin: Negative.   Neurological: Negative.   Endo/Heme/Allergies:  Negative.   Psychiatric/Behavioral: Positive for depression. The patient is nervous/anxious.    Blood pressure 104/76, pulse 72, temperature 98.4 F (36.9 C), temperature source Oral, resp. rate 18, height 5\' 7"  (1.702 m), weight 135 lb (61.2 kg), SpO2 99 %. Body mass index is 21.14 kg/m.  Musculoskeletal: Strength & Muscle Tone: within normal limits Gait & Station: normal Patient leans: N/A   [facility] MSE Discharge Disposition for Follow up and Recommendations: Based on my evaluation the patient does not appear to have an emergency medical condition and can be discharged with resources and follow up care in outpatient services for Medication Management, Partial Hospitalization Program and Individual Therapy   , FNP 02/04/2021, 4:22 PM

## 2021-02-05 ENCOUNTER — Other Ambulatory Visit: Payer: Self-pay

## 2021-02-05 ENCOUNTER — Telehealth (INDEPENDENT_AMBULATORY_CARE_PROVIDER_SITE_OTHER): Payer: No Payment, Other | Admitting: Psychiatry

## 2021-02-05 ENCOUNTER — Encounter (HOSPITAL_COMMUNITY): Payer: Self-pay | Admitting: Psychiatry

## 2021-02-05 DIAGNOSIS — F322 Major depressive disorder, single episode, severe without psychotic features: Secondary | ICD-10-CM | POA: Insufficient documentation

## 2021-02-05 DIAGNOSIS — F411 Generalized anxiety disorder: Secondary | ICD-10-CM | POA: Diagnosis not present

## 2021-02-05 MED ORDER — FLUOXETINE HCL 20 MG PO CAPS
ORAL_CAPSULE | ORAL | 2 refills | Status: DC
  Filled 2021-02-05: qty 30, 30d supply, fill #0
  Filled 2021-03-23: qty 30, 30d supply, fill #1

## 2021-02-05 MED ORDER — TRAZODONE HCL 50 MG PO TABS
50.0000 mg | ORAL_TABLET | Freq: Every evening | ORAL | 2 refills | Status: DC | PRN
  Filled 2021-02-05 – 2021-03-23 (×2): qty 30, 30d supply, fill #0

## 2021-02-05 MED ORDER — QUETIAPINE FUMARATE 50 MG PO TABS
50.0000 mg | ORAL_TABLET | Freq: Every day | ORAL | 2 refills | Status: DC
  Filled 2021-02-05: qty 30, 30d supply, fill #0
  Filled 2021-03-23: qty 30, 30d supply, fill #1

## 2021-02-05 NOTE — Progress Notes (Signed)
BH MD/PA/NP OP Progress Note Virtual Visit via Video Note  I connected with Scott Gordon on 02/05/21 at  8:30 AM EDT by a video enabled telemedicine application and verified that I am speaking with the correct person using two identifiers.  Location: Patient: Home Provider: Clinic   I discussed the limitations of evaluation and management by telemedicine and the availability of in person appointments. The patient expressed understanding and agreed to proceed.  I provided 30 minutes of non-face-to-face time during this encounter.     02/05/2021 10:03 AM Scott Gordon  MRN:  [id number]  Chief Complaint: "I've been better. I feel like most days i'm in a dissociative fog"   HPI: 30 year old male seen today for follow up psychiatric evaluation.He has a psychiatric history of bipolar 2, depression and anxiety.  He is currently managed on Trazodone 50 mg nightly as needed, Prozac 20 mg daily, and hydroxyzine 25 mg three times daily. He notes that his medications are somwhat effective in managing his psychiatric conditions.   Today he is well groomed, pleasant, cooperative, engaged in conversation and maintained eye contact. He informed provider that he has been more depressed and feels that he dissociates. Patient notes that he feels like he is in a dissociative fog. He notes that while in this fog he feels that he can not use his brain. Provider conducted a PHQ 9 and patient scored a 24, at his last visit he scored a 14. Provider also conducted a GAD 7 and patient scored a 15, at his last visit he scored a 14.  Patient endorses passive SI but denies wanting to harm himself today. He informed Clinical research associate that three weeks ago he experienced AH and heard people trying to convience that he was asleep. Today he he denies SI/HI/VH or paranoia. Patient notes that his appetite and sleep are poor. He informed Clinical research associate that he sleeps 5 hours nightly.  Patient informed Clinical research associate that life stressors are  exacerbating his mental health. He notes that his mother is being treated for stage 4 cancer, he worries about bulls, finances, and his future.  Today he is agreeable to start Seroquel 50 mg to help manage mood, anxiety, and sleep. Potential side effects of medication and risks vs benefits of treatment vs non-treatment were explained and discussed. All questions were answered. He notes that he finds hydroxyzine ineffective and notes he would like to discontinue it. He will continue all other medications as prescribed and follow up with outpatient counseling for therapy. No other concerns noted at this time.     Visit Diagnosis:    ICD-10-CM   1. Severe major depression without psychotic features (HCC)  F32.2 traZODone (DESYREL) 50 MG tablet    QUEtiapine (SEROQUEL) 50 MG tablet  2. GAD (generalized anxiety disorder)  F41.1 FLUoxetine (PROZAC) 20 MG capsule    QUEtiapine (SEROQUEL) 50 MG tablet    Past Psychiatric History: bipolar 2, depression and anxiety  Past Medical History:  Past Medical History:  Diagnosis Date  . Bipolar 2 disorder [facility])     Past Surgical History:  Procedure Laterality Date  . WISDOM TOOTH EXTRACTION      Family Psychiatric History: Notes that his family has mental health conditions but they are not diagnosed.    Family History: History reviewed. No pertinent family history.  Social History:  Social History   Socioeconomic History  . Marital status: Significant Other    Spouse name: Not on file  . Number of children: Not on file  .  Years of education: Not on file  . Highest education level: Not on file  Occupational History  . Not on file  Tobacco Use  . Smoking status: Never Smoker  . Smokeless tobacco: Never Used  Vaping Use  . Vaping Use: Every day  . Substances: Nicotine, Flavoring  Substance and Sexual Activity  . Alcohol use: Not Currently  . Drug use: Yes    Types: Marijuana  . Sexual activity: Not on file  Other Topics Concern  .  Not on file  Social History Narrative  . Not on file   Social Determinants of Health   Financial Resource Strain: Medium Risk  . Difficulty of Paying Living Expenses: Somewhat hard  Food Insecurity: No Food Insecurity  . Worried About Programme researcher, broadcasting/film/video in the Last Year: Never true  . Ran Out of Food in the Last Year: Never true  Transportation Needs: No Transportation Needs  . Lack of Transportation (Medical): No  . Lack of Transportation (Non-Medical): No  Physical Activity: Insufficiently Active  . Days of Exercise per Week: 1 day  . Minutes of Exercise per Session: 30 min  Stress: Stress Concern Present  . Feeling of Stress : Very much  Social Connections: Socially Isolated  . Frequency of Communication with Friends and Family: Twice a week  . Frequency of Social Gatherings with Friends and Family: Three times a week  . Attends Religious Services: Never  . Active Member of Clubs or Organizations: No  . Attends Banker Meetings: Never  . Marital Status: Never married    Allergies:  Allergies  Allergen Reactions  . Pineapple Hives    Metabolic Disorder Labs: No results found for: HGBA1C, MPG No results found for: PROLACTIN No results found for: CHOL, TRIG, HDL, CHOLHDL, VLDL, LDLCALC No results found for: TSH  Therapeutic Level Labs: No results found for: LITHIUM No results found for: VALPROATE No components found for:  CBMZ  Current Medications: Current Outpatient Medications  Medication Sig Dispense Refill  . QUEtiapine (SEROQUEL) 50 MG tablet Take 1 tablet (50 mg total) by mouth at bedtime. 60 tablet 2  . FLUoxetine (PROZAC) 20 MG capsule TAKE 1 CAPSULE BY MOUTH EVERY DAY 30 capsule 2  . traZODone (DESYREL) 50 MG tablet Take 1 tablet (50 mg total) by mouth at bedtime as needed. for sleep 90 tablet 2   No current facility-administered medications for this visit.     Musculoskeletal: Strength & Muscle Tone: Unable to assess due to telehealth  visit Gait & Station: Unable to assess due to telehealth visit Patient leans: N/A  Psychiatric Specialty Exam: Review of Systems  There were no vitals taken for this visit.There is no height or weight on file to calculate BMI.  General Appearance: Well Groomed  Eye Contact:  Good  Speech:  Clear and Coherent and Normal Rate  Volume:  Normal  Mood:  Anxious and Depressed  Affect:  Appropriate and Congruent  Thought Process:  Coherent, Goal Directed and Linear  Orientation:  Full (Time, Place, and Person)  Thought Content: WDL and Logical   Suicidal Thoughts:  Yes.  without intent/plan  Homicidal Thoughts:  No  Memory:  Immediate;   Fair Recent;   Fair Remote;   Fair  Judgement:  Good  Insight:  Good  Psychomotor Activity:  Normal  Concentration:  Concentration: Fair and Attention Span: Fair  Recall:  Good  Fund of Knowledge: Good  Language: Good  Akathisia:  No  Handed:  Right  AIMS (if indicated): Not done  Assets:  Communication Skills Desire for Improvement Financial Resources/Insurance Housing Leisure Time Physical Health Social Support  ADL's:  Intact  Cognition: WNL  Sleep:  Poor   Screenings: AUDIT   Advertising copywriter from 06/03/2020 in [facility]  Alcohol Use Disorder Identification Test Final Score (AUDIT) 4    GAD-7   Flowsheet Row Video Visit from 02/05/2021 in [facility] Office Visit from 07/02/2020 in [facility] Counselor from 06/03/2020 in [facility] Office Visit from 07/03/2018 in [facility]  Total GAD-7 Score 15 14 16 10     PHQ2-9   Flowsheet Row Video Visit from 02/05/2021 in [facility] Office Visit from 07/02/2020 in [facility] Counselor from 06/03/2020 in [facility] Office Visit from 07/03/2018 in [facility]  PHQ-2 Total Score 6 4 5 5   PHQ-9 Total Score 24 16 20 17     Flowsheet Row Video Visit from 02/05/2021 in [facility] Counselor from 06/03/2020 in [facility]  C-SSRS RISK CATEGORY Error: Q7 should not be populated when Q6 is No No Risk       Assessment and Plan: Patient endorses symptoms of anxiety, depression, and insomnia.  Today he is agreeable to start Seroquel 50 mg to help manage mood, anxiety, and sleep. He notes that he finds hydroxyzine ineffective and notes he would like to discontinue it. He will continue all other medications as prescribed.  1. GAD (generalized anxiety disorder)  Continue- FLUoxetine (PROZAC) 20 MG capsule; TAKE 1 CAPSULE BY MOUTH EVERY DAY  Dispense: 30 capsule; Refill: 2 Start- QUEtiapine (SEROQUEL) 50 MG tablet; Take 1 tablet (50 mg total) by mouth at bedtime.  Dispense: 60 tablet; Refill: 2  2. Severe major depression without psychotic features (HCC)  Continue- traZODone (DESYREL) 50 MG tablet; Take 1 tablet (50 mg total) by mouth at bedtime as needed. for sleep  Dispense: 90 tablet; Refill: 2 Start- QUEtiapine (SEROQUEL) 50 MG tablet; Take 1 tablet (50 mg total) by mouth at bedtime.  Dispense: 60 tablet; Refill: 2  Follow up in 3 months Follow up with therapy   [facility], NP 02/05/2021, 10:03 AM

## 2021-02-09 ENCOUNTER — Ambulatory Visit (HOSPITAL_COMMUNITY)
Admission: RE | Admit: 2021-02-09 | Discharge: 2021-02-09 | Disposition: A | Discharge status: Home / Self Care | Payer: Self-pay | Attending: Psychiatry | Admitting: Psychiatry

## 2021-02-09 DIAGNOSIS — F322 Major depressive disorder, single episode, severe without psychotic features: Secondary | ICD-10-CM | POA: Diagnosis present

## 2021-02-09 DIAGNOSIS — F411 Generalized anxiety disorder: Secondary | ICD-10-CM | POA: Diagnosis present
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
## 2021-02-10 ENCOUNTER — Other Ambulatory Visit: Payer: Self-pay

## 2021-02-12 ENCOUNTER — Other Ambulatory Visit: Payer: Self-pay

## 2021-03-05 ENCOUNTER — Encounter (HOSPITAL_COMMUNITY): Payer: Self-pay

## 2021-03-05 ENCOUNTER — Other Ambulatory Visit: Payer: Self-pay

## 2021-03-05 ENCOUNTER — Ambulatory Visit (HOSPITAL_COMMUNITY)
Admission: EM | Admit: 2021-03-05 | Discharge: 2021-03-05 | Disposition: A | Discharge status: Home / Self Care | Payer: Medicaid Other | Attending: Family Medicine | Admitting: Family Medicine

## 2021-03-05 DIAGNOSIS — R590 Localized enlarged lymph nodes: Secondary | ICD-10-CM

## 2021-03-05 DIAGNOSIS — L989 Disorder of the skin and subcutaneous tissue, unspecified: Secondary | ICD-10-CM

## 2021-03-05 MED ORDER — TRIAMCINOLONE ACETONIDE 0.1 % EX CREA: 1.0000 "application " | TOPICAL_CREAM | Freq: Two times a day (BID) | CUTANEOUS | 0 refills | Status: DC

## 2021-03-05 NOTE — Discharge Instructions (Addendum)
It is unclear at this time if the 2 areas represent swollen lymph nodes or some type of rash.  Keep a close eye on the areas as discussed and return for worsening symptoms.  Try compresses, ibuprofen, triamcinolone cream for now and follow-up if worsening or changing.  Someone should be reaching out to help you establish care with a primary care provider additionally.

## 2021-03-05 NOTE — ED Triage Notes (Signed)
Pt presents with lumps on neck x 3 days.   States he noticed one lump 3 days ago and c/o of one other lump growing.

## 2021-03-09 NOTE — ED Provider Notes (Signed)
[facility]    CSN: [id number] Arrival date & time: 03/05/21  1817      History   Chief Complaint Chief Complaint  Patient presents with   neck problem    HPI Scott Gordon is a 30 y.o. male.   Presenting today for evaluation of two areas of redness and swelling that came up several days ago on the left side of his neck, one below and behind left ear and one near clavicle. Nontender, no itching, no injury to area, no recent illnesses, fever, chills, ear pain, weight loss. No known hx of similar issues or chronic dermatologic issues. Has not tried anything OTC for sxs thus far.    Past Medical History:  Diagnosis Date   Bipolar 2 disorder [facility])     Patient Active Problem List   Diagnosis Date Noted   Severe major depression without psychotic features (HCC) 02/05/2021   Moderate episode of recurrent major depressive disorder (HCC) 06/03/2020   GAD (generalized anxiety disorder) 06/03/2020   Elevated CK 06/14/2018   Non-traumatic rhabdomyolysis    Abdominal pain     Past Surgical History:  Procedure Laterality Date   WISDOM TOOTH EXTRACTION         Home Medications    Prior to Admission medications   Medication Sig Start Date End Date Taking? Authorizing Provider  triamcinolone cream (KENALOG) 0.1 % Apply 1 application topically 2 (two) times daily. 03/05/21  Yes Particia Nearing, PA-C  FLUoxetine (PROZAC) 20 MG capsule TAKE 1 CAPSULE BY MOUTH EVERY DAY 02/05/21   Toy Cookey E, NP  QUEtiapine (SEROQUEL) 50 MG tablet Take 1 tablet (50 mg total) by mouth at bedtime. 02/05/21   Shanna Cisco, NP  traZODone (DESYREL) 50 MG tablet Take 1 tablet (50 mg total) by mouth at bedtime as needed. for sleep 02/05/21   Shanna Cisco, NP    Family History Family History  Family history unknown: Yes    Social History Social History   Tobacco Use   Smoking status: Never   Smokeless tobacco: Never  Vaping Use   Vaping Use: Every day    Substances: Nicotine, Flavoring  Substance Use Topics   Alcohol use: Not Currently   Drug use: Yes    Types: Marijuana     Allergies   Pineapple   Review of Systems Review of Systems PER HPI    Physical Exam Triage Vital Signs ED Triage Vitals  Enc Vitals Group     BP 03/05/21 1931 109/70     Pulse Rate 03/05/21 1931 95     Resp 03/05/21 1931 20     Temp 03/05/21 1931 98.5 F (36.9 C)     Temp Source 03/05/21 1931 Oral     SpO2 03/05/21 1931 98 %     Weight --      Height --      Head Circumference --      Peak Flow --      Pain Score 03/05/21 1929 0     Pain Loc --      Pain Edu? --      Excl. in GC? --    No data found.  Updated Vital Signs BP 109/70 (BP Location: Left Arm)   Pulse 95   Temp 98.5 F (36.9 C) (Oral)   Resp 20   SpO2 98%   Visual Acuity Right Eye Distance:   Left Eye Distance:   Bilateral Distance:    Right Eye Near:  Left Eye Near:    Bilateral Near:     Physical Exam Vitals and nursing note reviewed.  Constitutional:      Appearance: Normal appearance.  HENT:     Head: Atraumatic.     Right Ear: Tympanic membrane normal.     Left Ear: Tympanic membrane normal.     Nose: Nose normal.     Mouth/Throat:     Mouth: Mucous membranes are moist.     Pharynx: Oropharynx is clear.  Eyes:     Extraocular Movements: Extraocular movements intact.     Conjunctiva/sclera: Conjunctivae normal.  Cardiovascular:     Rate and Rhythm: Normal rate and regular rhythm.  Pulmonary:     Effort: Pulmonary effort is normal.     Breath sounds: Normal breath sounds.  Musculoskeletal:        General: No swelling or tenderness. Normal range of motion.     Cervical back: Normal range of motion and neck supple.  Skin:    General: Skin is warm and dry.     Findings: Erythema present.     Comments: Two discrete areas of mild edema, erythema possibly representing inflamed lymph nodes - one in left posterior auricular region and one below base of  left neck. Nontender, no drainage  Neurological:     General: No focal deficit present.     Mental Status: He is oriented to person, place, and time.  Psychiatric:        Mood and Affect: Mood normal.        Thought Content: Thought content normal.        Judgment: Judgment normal.     UC Treatments / Results  Labs (all labs ordered are listed, but only abnormal results are displayed) Labs Reviewed - No data to display  EKG   Radiology No results found.  Procedures Procedures (including critical care time)  Medications Ordered in UC Medications - No data to display  Initial Impression / Assessment and Plan / UC Course  I have reviewed the triage vital signs and the nursing notes.  Pertinent labs & imaging results that were available during my care of the patient were reviewed by me and considered in my medical decision making (see chart for details).     Discussed with patient unclear cause of his new skin changes, no evidence of infection today in ENT system or recent viral generalized illness, vital signs reassuring, and he is in no discomfort from them. Warm compresses, triamcinolone cream trial, and PCP f/u recommended for recheck. Return sooner if sxs worsening.   Final Clinical Impressions(s) / UC Diagnoses   Final diagnoses:  Posterior auricular lymphadenopathy  Skin lesion     Discharge Instructions      It is unclear at this time if the 2 areas represent swollen lymph nodes or some type of rash.  Keep a close eye on the areas as discussed and return for worsening symptoms.  Try compresses, ibuprofen, triamcinolone cream for now and follow-up if worsening or changing.  Someone should be reaching out to help you establish care with a primary care provider additionally.     ED Prescriptions     Medication Sig Dispense Auth. Provider   triamcinolone cream (KENALOG) 0.1 % Apply 1 application topically 2 (two) times daily. 60 g Particia Nearing, [place]       PDMP not reviewed this encounter.   Roosvelt Maser [place], [place] 03/09/21 [phone]

## 2021-03-15 ENCOUNTER — Emergency Department (HOSPITAL_COMMUNITY)
Admission: EM | Admit: 2021-03-15 | Discharge: 2021-03-16 | Disposition: A | Discharge status: Home / Self Care | Payer: Self-pay | Attending: Emergency Medicine | Admitting: Emergency Medicine

## 2021-03-15 ENCOUNTER — Encounter (HOSPITAL_COMMUNITY): Payer: Self-pay

## 2021-03-15 ENCOUNTER — Other Ambulatory Visit: Payer: Self-pay

## 2021-03-15 DIAGNOSIS — D72829 Elevated white blood cell count, unspecified: Secondary | ICD-10-CM | POA: Insufficient documentation

## 2021-03-15 DIAGNOSIS — E860 Dehydration: Secondary | ICD-10-CM | POA: Insufficient documentation

## 2021-03-15 DIAGNOSIS — Z20822 Contact with and (suspected) exposure to covid-19: Secondary | ICD-10-CM | POA: Insufficient documentation

## 2021-03-15 DIAGNOSIS — K5900 Constipation, unspecified: Secondary | ICD-10-CM | POA: Insufficient documentation

## 2021-03-15 DIAGNOSIS — R112 Nausea with vomiting, unspecified: Secondary | ICD-10-CM | POA: Insufficient documentation

## 2021-03-15 DIAGNOSIS — R1084 Generalized abdominal pain: Secondary | ICD-10-CM | POA: Insufficient documentation

## 2021-03-15 DIAGNOSIS — Z79899 Other long term (current) drug therapy: Secondary | ICD-10-CM | POA: Insufficient documentation

## 2021-03-15 LAB — CBC WITH DIFFERENTIAL/PLATELET
Abs Immature Granulocytes: 0.04 10*3/uL (ref 0.00–0.07)
Basophils Absolute: 0 10*3/uL (ref 0.0–0.1)
Basophils Relative: 0 %
Eosinophils Absolute: 0 10*3/uL (ref 0.0–0.5)
Eosinophils Relative: 0 %
HCT: 47.1 % (ref 39.0–52.0)
Hemoglobin: 15.4 g/dL (ref 13.0–17.0)
Immature Granulocytes: 0 %
Lymphocytes Relative: 6 %
Lymphs Abs: 0.8 10*3/uL (ref 0.7–4.0)
MCH: 28.5 pg (ref 26.0–34.0)
MCHC: 32.7 g/dL (ref 30.0–36.0)
MCV: 87.2 fL (ref 80.0–100.0)
Monocytes Absolute: 0.6 10*3/uL (ref 0.1–1.0)
Monocytes Relative: 4 %
Neutro Abs: 12.1 10*3/uL — ABNORMAL HIGH (ref 1.7–7.7)
Neutrophils Relative %: 90 %
Platelets: 247 10*3/uL (ref 150–400)
RBC: 5.4 MIL/uL (ref 4.22–5.81)
RDW: 13.2 % (ref 11.5–15.5)
WBC: 13.6 10*3/uL — ABNORMAL HIGH (ref 4.0–10.5)
nRBC: 0 % (ref 0.0–0.2)

## 2021-03-15 LAB — RAPID URINE DRUG SCREEN, HOSP PERFORMED
Amphetamines: NOT DETECTED
Barbiturates: NOT DETECTED
Benzodiazepines: NOT DETECTED
Cocaine: NOT DETECTED
Opiates: NOT DETECTED
Tetrahydrocannabinol: POSITIVE — AB

## 2021-03-15 LAB — COMPREHENSIVE METABOLIC PANEL
ALT: 23 U/L (ref 0–44)
AST: 23 U/L (ref 15–41)
Albumin: 4.5 g/dL (ref 3.5–5.0)
Alkaline Phosphatase: 49 U/L (ref 38–126)
Anion gap: 11 (ref 5–15)
BUN: 19 mg/dL (ref 6–20)
CO2: 25 mmol/L (ref 22–32)
Calcium: 9.6 mg/dL (ref 8.9–10.3)
Chloride: 104 mmol/L (ref 98–111)
Creatinine, Ser: 0.89 mg/dL (ref 0.61–1.24)
GFR, Estimated: >60 mL/min (ref >60)
Glucose, Bld: 73 mg/dL (ref 70–99)
Potassium: 4.1 mmol/L (ref 3.5–5.1)
Sodium: 140 mmol/L (ref 135–145)
Total Bilirubin: 1.2 mg/dL (ref 0.3–1.2)
Total Protein: 7.9 g/dL (ref 6.5–8.1)

## 2021-03-15 LAB — RESP PANEL BY RT-PCR (FLU A&B, COVID) ARPGX2
Influenza A by PCR: NEGATIVE
Influenza B by PCR: NEGATIVE
SARS Coronavirus 2 by RT PCR: NEGATIVE

## 2021-03-15 LAB — LIPASE, BLOOD: Lipase: 32 U/L (ref 11–51)

## 2021-03-15 NOTE — ED Triage Notes (Signed)
Pt reports generalized abd pain with nausea and decreased appetite x3 days.

## 2021-03-15 NOTE — ED Provider Notes (Signed)
Emergency Medicine Provider Triage Evaluation Note  Scott Gordon , a 30 y.o. male  was evaluated in triage.  Pt complains of nausea and vomiting, going on for last 3 days, states he has been able to tolerate p.o., he has no associated abdominal pain, no notable diarrhea, constipation, states he is never had this past, he has no significant abdominal surgery, he does states he smokes cannabis states he is never had any issues in the past..  Review of Systems  Positive: Nausea, vomiting Negative: Abdominal pain, diarrhea  Physical Exam  BP 98/62   Pulse (!) 107   Temp 98 F (36.7 C)   Resp 20   Ht 5\' 7"  (1.702 m)   Wt 61 kg   SpO2 100%   BMI 21.06 kg/m  Gen:   Awake, no distress   Resp:  Normal effort  MSK:   Moves extremities without difficulty  Other:    Medical Decision Making  Medically screening exam initiated at 6:33 PM.  Appropriate orders placed.  Scott Gordon was informed that the remainder of the evaluation will be completed by another provider, this initial triage assessment does not replace that evaluation, and the importance of remaining in the ED until their evaluation is complete.  Patient presents with vomiting, lab work has been ordered, patient need further work-up here in the emergency department.   [name], PA-C 03/15/21 1834    05/16/21, MD 03/16/21 [phone]

## 2021-03-16 ENCOUNTER — Encounter (HOSPITAL_COMMUNITY): Payer: Self-pay

## 2021-03-16 ENCOUNTER — Emergency Department (HOSPITAL_COMMUNITY): Payer: Self-pay

## 2021-03-16 LAB — CK: Total CK: 148 U/L (ref 49–397)

## 2021-03-16 MED ORDER — IOHEXOL 350 MG/ML SOLN
100.0000 mL | Freq: Once | INTRAVENOUS | Status: AC | PRN
  Administered 2021-03-16: 80 mL via INTRAVENOUS

## 2021-03-16 MED ORDER — PROMETHAZINE HCL 25 MG PO TABS: 25.0000 mg | ORAL_TABLET | Freq: Four times a day (QID) | ORAL | 0 refills | Status: DC | PRN

## 2021-03-16 MED ORDER — SODIUM CHLORIDE (PF) 0.9 % IJ SOLN
INTRAMUSCULAR | Status: AC
  Filled 2021-03-16: qty 50

## 2021-03-16 MED ORDER — SODIUM CHLORIDE 0.9 % IV SOLN
25.0000 mg | Freq: Once | INTRAVENOUS | Status: AC
  Administered 2021-03-16: 25 mg via INTRAVENOUS
  Filled 2021-03-16: qty 25

## 2021-03-16 MED ORDER — DIPHENHYDRAMINE HCL 50 MG/ML IJ SOLN
50.0000 mg | Freq: Once | INTRAMUSCULAR | Status: AC
  Administered 2021-03-16: 50 mg via INTRAVENOUS
  Filled 2021-03-16: qty 1

## 2021-03-16 MED ORDER — SODIUM CHLORIDE 0.9 % IV BOLUS
1000.0000 mL | Freq: Once | INTRAVENOUS | Status: AC
  Administered 2021-03-16: 1000 mL via INTRAVENOUS

## 2021-03-16 MED ORDER — IOHEXOL 300 MG/ML  SOLN: 100.0000 mL | Freq: Once | INTRAMUSCULAR | Status: DC | PRN

## 2021-03-16 NOTE — Discharge Instructions (Addendum)
Your history, exam, work-up today were overall reassuring with no acute concerning findings on the CT imaging.  Please rest and stay hydrated and use the nausea medicine to do so.  Please follow-up with your primary doctor.  If any symptoms change or worsen acutely, please return to the nearest emergency department.

## 2021-03-16 NOTE — ED Provider Notes (Signed)
[facility]EM[facility]ENCY [facility]PT Provider Note   CSN: [id number] Arrival date & time: 03/15/21  1730     History Chief Complaint  Patient presents with   Abdominal Pain    Scott Gordon is a 30 y.o. male.  The history is provided by the patient and medical records. No language interpreter was used.  Abdominal Pain Pain location:  Generalized Pain quality: aching and cramping   Pain radiates to:  Does not radiate Pain severity:  Moderate Onset quality:  Gradual Duration:  3 days Timing:  Intermittent Progression:  Waxing and waning Chronicity:  New Relieved by:  Nothing Worsened by:  Vomiting Ineffective treatments:  None tried Associated symptoms: constipation and nausea   Associated symptoms: no chest pain, no chills, no cough, no diarrhea, no dysuria, no fatigue, no fever, no hematemesis and no shortness of breath       Past Medical History:  Diagnosis Date   Bipolar 2 disorder [facility])     Patient Active Problem List   Diagnosis Date Noted   Severe major depression without psychotic features (HCC) 02/05/2021   Moderate episode of recurrent major depressive disorder (HCC) 06/03/2020   GAD (generalized anxiety disorder) 06/03/2020   Elevated CK 06/14/2018   Non-traumatic rhabdomyolysis    Abdominal pain     Past Surgical History:  Procedure Laterality Date   WISDOM TOOTH EXTRACTION         Family History  Family history unknown: Yes    Social History   Tobacco Use   Smoking status: Never   Smokeless tobacco: Never  Vaping Use   Vaping Use: Every day   Substances: Nicotine, Flavoring  Substance Use Topics   Alcohol use: Not Currently   Drug use: Yes    Types: Marijuana    Home Medications Prior to Admission medications   Medication Sig Start Date End Date Taking? Authorizing Provider  FLUoxetine (PROZAC) 20 MG capsule TAKE 1 CAPSULE BY MOUTH EVERY DAY 02/05/21   Toy Cookey E, NP  QUEtiapine (SEROQUEL) 50 MG tablet  Take 1 tablet (50 mg total) by mouth at bedtime. 02/05/21   Shanna Cisco, NP  traZODone (DESYREL) 50 MG tablet Take 1 tablet (50 mg total) by mouth at bedtime as needed. for sleep 02/05/21   Toy Cookey E, NP  triamcinolone cream (KENALOG) 0.1 % Apply 1 application topically 2 (two) times daily. 03/05/21   Particia Nearing, PA-C    Allergies    Pineapple  Review of Systems   Review of Systems  Constitutional:  Negative for chills, diaphoresis, fatigue and fever.  HENT:  Negative for congestion and rhinorrhea.   Eyes:  Negative for visual disturbance.  Respiratory:  Negative for cough, chest tightness, shortness of breath and wheezing.   Cardiovascular:  Negative for chest pain, palpitations and leg swelling.  Gastrointestinal:  Positive for abdominal pain, constipation and nausea. Negative for abdominal distention, diarrhea and hematemesis.  Genitourinary:  Negative for decreased urine volume, dysuria, flank pain and frequency.  Musculoskeletal:  Negative for back pain, neck pain and neck stiffness.  Skin:  Negative for wound.  Neurological:  Negative for headaches.  Psychiatric/Behavioral:  Negative for agitation.   All other systems reviewed and are negative.  Physical Exam Updated Vital Signs BP 114/73   Pulse 84   Temp 98.3 F (36.8 C) (Oral)   Resp 17   Ht 5\' 7"  (1.702 m)   Wt 59 kg   SpO2 99%   BMI 20.36 kg/m  Physical Exam Vitals and nursing note reviewed.  Constitutional:      General: He is not in acute distress.    Appearance: He is well-developed. He is not ill-appearing, toxic-appearing or diaphoretic.  HENT:     Head: Normocephalic and atraumatic.     Mouth/Throat:     Mouth: Mucous membranes are dry.  Eyes:     Conjunctiva/sclera: Conjunctivae normal.     Pupils: Pupils are equal, round, and reactive to light.  Cardiovascular:     Rate and Rhythm: Normal rate and regular rhythm.     Pulses: Normal pulses.     Heart sounds: No murmur  heard. Pulmonary:     Effort: Pulmonary effort is normal. No respiratory distress.     Breath sounds: Normal breath sounds.  Abdominal:     General: Abdomen is flat. Bowel sounds are normal. There is no distension.     Palpations: Abdomen is soft.     Tenderness: There is abdominal tenderness in the epigastric area. There is no right CVA tenderness, left CVA tenderness or guarding.  Musculoskeletal:        General: No tenderness.     Cervical back: Neck supple. No tenderness.  Skin:    General: Skin is warm and dry.     Capillary Refill: Capillary refill takes less than 2 seconds.     Findings: No erythema.  Neurological:     General: No focal deficit present.     Mental Status: He is alert. Mental status is at baseline.     Motor: No weakness.  Psychiatric:        Mood and Affect: Mood normal.    ED Results / Procedures / Treatments   Labs (all labs ordered are listed, but only abnormal results are displayed) Labs Reviewed  CBC WITH DIFFERENTIAL/PLATELET - Abnormal; Notable for the following components:      Result Value   WBC 13.6 (*)    Neutro Abs 12.1 (*)    All other components within normal limits  RAPID URINE DRUG SCREEN, HOSP PERFORMED - Abnormal; Notable for the following components:   Tetrahydrocannabinol POSITIVE (*)    All other components within normal limits  RESP PANEL BY RT-PCR (FLU A&B, COVID) ARPGX2  COMPREHENSIVE METABOLIC PANEL  LIPASE, BLOOD  CK    EKG None  Radiology CT ABDOMEN PELVIS W CONTRAST  Result Date: 03/16/2021 CLINICAL DATA:  Abdominal pain with nausea and vomiting EXAM: CT ABDOMEN AND PELVIS WITH CONTRAST TECHNIQUE: Multidetector CT imaging of the abdomen and pelvis was performed using the standard protocol following bolus administration of intravenous contrast. CONTRAST:  70mL OMNIPAQUE IOHEXOL 350 MG/ML SOLN COMPARISON:  06/14/2018 FINDINGS: Lower chest: No acute abnormality. Hepatobiliary: Small cyst is noted in the dome of the liver  laterally. No gallstones, gallbladder wall thickening, or biliary dilatation. Pancreas: Unremarkable. No pancreatic ductal dilatation or surrounding inflammatory changes. Spleen: Normal in size without focal abnormality. Adrenals/Urinary Tract: Adrenal glands are within normal limits. Kidneys demonstrate a normal enhancement pattern bilaterally. No renal calculi or obstructive changes are seen. Ureters are within normal limits. The bladder is partially distended. Stomach/Bowel: No obstructive or inflammatory changes of the colon are seen. The appendix is air-filled and within normal limits. Small bowel and stomach are within normal limits. Vascular/Lymphatic: No significant vascular findings are present. No enlarged abdominal or pelvic lymph nodes. Reproductive: Prostate is unremarkable. Other: No abdominal wall hernia or abnormality. No abdominopelvic ascites. Musculoskeletal: No acute or significant osseous findings. IMPRESSION: No acute abnormality  is identified correspond with the patient's given clinical symptomatology. Electronically Signed   By: Alcide CleverMark  Lukens M.D.   On: 03/16/2021 01:48    Procedures Procedures   Medications Ordered in ED Medications  sodium chloride (PF) 0.9 % injection (has no administration in time range)  promethazine (PHENERGAN) 25 mg in sodium chloride 0.9 % 50 mL IVPB (0 mg Intravenous Stopped 03/16/21 0105)  sodium chloride 0.9 % bolus 1,000 mL (0 mLs Intravenous Stopped 03/16/21 0221)  diphenhydrAMINE (BENADRYL) injection 50 mg (50 mg Intravenous Given 03/16/21 0120)  iohexol (OMNIPAQUE) 350 MG/ML injection 100 mL (80 mLs Intravenous Contrast Given 03/16/21 0133)    ED Course  I have reviewed the triage vital signs and the nursing notes.  Pertinent labs & imaging results that were available during my care of the patient were reviewed by me and considered in my medical decision making (see chart for details).    MDM Rules/Calculators/A&P                           Radonna Rickerlexander H Aderman is a 30 y.o. male with a past medical history significant for bipolar disorder, depression, and previous rhabdo who presents with 3 days of worsening nausea, vomiting, intolerance of p.o., and abdominal discomfort.  Patient reports that he has had some nausea for around a week but worsened over the last 3 days.  He reports he has not had anything to eat or drink other than a small piece of a smoothie yesterday.  He reports he is having less bowel movements but is still having them.  Reports having some abdominal discomfort all over but occasionally has epigastric area as well.  He reports the pain comes and goes.  He denies history of appendicitis or gallbladder disease.  Denies any trauma.  Denies fevers or chills.  He has no chest pain, shortness of breath, palpitations.  He reports he used some Zofran at home last night but has help with the nausea but he continued to vomit.  Of note, patient has been here approximately 6 hours and 45 minutes prior to my initial evaluation.   On exam, lungs are clear and chest is nontender.  Abdomen had some mild tenderness in the epigastric area.  No CVA tenderness or rash seen.  No flank tenderness or back tenderness.  Patient moving all extremities.  Patient has dry mucous membranes.  Patient had work-up in triage including labs.  Patient does have a leukocytosis but otherwise no evidence of AKI, electrolyte abnormality, or significant LFT elevation.  Lipase not elevated, doubt pancreatitis.  COVID and flu test negative.  Given the patient's abdominal discomfort, uncontrolled nausea and vomiting, appearance of dehydration clinically, and the leukocytosis, will get CT scan to rule out diverticulitis, appendicitis, or gallbladder etiology of symptoms.  Overall suspect viral syndrome but COVID and flu are negative.  Will give fluids, nausea medicine, and his chart shows that he has had rhabdo in the past, given his appearance of dehydration, will  make sure he does not have rhabdo again as well.  Anticipate reassessment after work-up.  4:57 AM CT scan returned reassuring with no evidence of acute pathology seen.  No evidence of acute cholecystitis or appendicitis.  Patient was feeling better on reassessment.  Unclear etiology of his symptoms however we discussed that it could be related to marijuana use.  We feel he is safe for discharge home.  Patient will take nausea medicine and rest.  He understands  return precautions and will maintain hydration at home.  He no questions or concerns and was discharged in good condition with improving symptoms.   Final Clinical Impression(s) / ED Diagnoses Final diagnoses:  Generalized abdominal pain  Intractable vomiting with nausea, unspecified vomiting type    Rx / DC Orders ED Discharge Orders          Ordered    promethazine (PHENERGAN) 25 MG tablet  Every 6 hours PRN        03/16/21 0458            Clinical Impression: 1. Generalized abdominal pain   2. Intractable vomiting with nausea, unspecified vomiting type     Disposition: Discharge  Condition: Good  I have discussed the results, Dx and Tx plan with the pt(& family if present). He/she/they expressed understanding and agree(s) with the plan. Discharge instructions discussed at great length. Strict return precautions discussed and pt &/or family have verbalized understanding of the instructions. No further questions at time of discharge.    New Prescriptions   PROMETHAZINE (PHENERGAN) 25 MG TABLET    Take 1 tablet (25 mg total) by mouth every 6 (six) hours as needed for nausea or vomiting.    Follow Up: [facility] [address] [place] [address] [phone] Schedule an appointment as soon as possible for a visit    [facility] [address] [id number] mc [place]  [address] [phone]        Kita Neace, Canary Brim, MD 03/16/21 [phone]

## 2021-03-23 ENCOUNTER — Other Ambulatory Visit: Payer: Self-pay

## 2021-03-23 ENCOUNTER — Telehealth: Payer: Self-pay | Admitting: Physician Assistant

## 2021-03-23 ENCOUNTER — Ambulatory Visit (HOSPITAL_COMMUNITY)
Admission: RE | Admit: 2021-03-23 | Discharge: 2021-03-23 | Disposition: A | Discharge status: Home / Self Care | Payer: Self-pay | Source: Ambulatory Visit | Attending: Physician Assistant | Admitting: Physician Assistant

## 2021-03-23 ENCOUNTER — Telehealth: Payer: Self-pay

## 2021-03-23 ENCOUNTER — Ambulatory Visit: Payer: Self-pay | Admitting: Physician Assistant

## 2021-03-23 VITALS — BP 117/70 | HR 74 | Temp 98.2°F | Resp 18 | Ht 67.0 in | Wt 129.0 lb

## 2021-03-23 DIAGNOSIS — R634 Abnormal weight loss: Secondary | ICD-10-CM

## 2021-03-23 DIAGNOSIS — Z111 Encounter for screening for respiratory tuberculosis: Secondary | ICD-10-CM

## 2021-03-23 DIAGNOSIS — F411 Generalized anxiety disorder: Secondary | ICD-10-CM

## 2021-03-23 DIAGNOSIS — Z6820 Body mass index (BMI) 20.0-20.9, adult: Secondary | ICD-10-CM

## 2021-03-23 DIAGNOSIS — R61 Generalized hyperhidrosis: Secondary | ICD-10-CM | POA: Insufficient documentation

## 2021-03-23 DIAGNOSIS — F322 Major depressive disorder, single episode, severe without psychotic features: Secondary | ICD-10-CM

## 2021-03-23 DIAGNOSIS — D72829 Elevated white blood cell count, unspecified: Secondary | ICD-10-CM

## 2021-03-23 MED ORDER — TRAZODONE HCL 100 MG PO TABS
100.0000 mg | ORAL_TABLET | Freq: Every evening | ORAL | 1 refills | Status: DC | PRN
  Filled 2021-03-23: qty 30, 30d supply, fill #0

## 2021-03-23 MED ORDER — ARIPIPRAZOLE 5 MG PO TABS
5.0000 mg | ORAL_TABLET | Freq: Every day | ORAL | 1 refills | Status: DC
  Filled 2021-03-23: qty 30, 30d supply, fill #0
  Filled 2021-04-27: qty 30, 30d supply, fill #1

## 2021-03-23 NOTE — Telephone Encounter (Signed)
Following up on a request to assist patient care in the community. Verified the patients name and date of birth. Verified patient does have access to transportation. CM provided patient with information on the Mobile Unit: including time of service as well. Patient expressed appreciation for the information and plans to visit the unit soon.

## 2021-03-23 NOTE — Patient Instructions (Signed)
  Scott Gordon, thank you for joining Piedad Climes, PA-C for today's virtual visit.  While this provider is not your primary care provider (PCP), if your PCP is located in our provider database this encounter information will be shared with them immediately following your visit.  Consent: (Patient) Scott Gordon provided verbal consent for this virtual visit at the beginning of the encounter.  Current Medications:  Current Outpatient Medications:    FLUoxetine (PROZAC) 20 MG capsule, TAKE 1 CAPSULE BY MOUTH EVERY DAY, Disp: 30 capsule, Rfl: 2   promethazine (PHENERGAN) 25 MG tablet, Take 1 tablet (25 mg total) by mouth every 6 (six) hours as needed for nausea or vomiting., Disp: 21 tablet, Rfl: 0   QUEtiapine (SEROQUEL) 50 MG tablet, Take 1 tablet (50 mg total) by mouth at bedtime., Disp: 60 tablet, Rfl: 2   traZODone (DESYREL) 50 MG tablet, Take 1 tablet (50 mg total) by mouth at bedtime as needed. for sleep, Disp: 90 tablet, Rfl: 2   triamcinolone cream (KENALOG) 0.1 %, Apply 1 application topically 2 (two) times daily., Disp: 60 g, Rfl: 0   Medications ordered in this encounter:  No orders of the defined types were placed in this encounter.    *If you need refills on other medications prior to your next appointment, please contact your pharmacy*  Follow-Up: Call back or seek an in-person evaluation if the symptoms worsen or if the condition fails to improve as anticipated.  Other Instructions Please use the links below to see about getting set up with a new PCP if we are unable to get you in with [facility]. Their number is [phone].   I am going to speak with my directors to see if they can reach out and assist with getting you set up somewhere. I will update you as soon as I am able.   If anything acutely worsens, I do want you to seek care at Urgent Care or ER so they can assess thyroid function and recheck blood counts.    If you have  been instructed to have an in-person evaluation today at a local Urgent Care facility, please use the link below. It will take you to a list of all of our available [facility], including address, phone number and hours of operation. Please do not delay care.  [facility]  If you or a family member do not have a primary care provider, use the link below to schedule a visit and establish care. When you choose a [facility] primary care physician or advanced practice provider, you gain a long-term partner in health. Find a Primary Care Provider  Learn more about [facility] in-office and virtual care options: [facility] - Get Care Now

## 2021-03-23 NOTE — Progress Notes (Signed)
Virtual Visit Consent   Scott Gordon, you are scheduled for a virtual visit with a [facility] provider today.     Just as with appointments in the office, your consent must be obtained to participate.  Your consent will be active for this visit and any virtual visit you may have with one of our providers in the next 365 days.     If you have a MyChart account, a copy of this consent can be sent to you electronically.  All virtual visits are billed to your insurance company just like a traditional visit in the office.    As this is a virtual visit, video technology does not allow for your provider to perform a traditional examination.  This may limit your provider's ability to fully assess your condition.  If your provider identifies any concerns that need to be evaluated in person or the need to arrange testing (such as labs, EKG, etc.), we will make arrangements to do so.     Although advances in technology are sophisticated, we cannot ensure that it will always work on either your end or our end.  If the connection with a video visit is poor, the visit may have to be switched to a telephone visit.  With either a video or telephone visit, we are not always able to ensure that we have a secure connection.     I need to obtain your verbal consent now.   Are you willing to proceed with your visit today?    Scott Gordon has provided verbal consent on 03/23/2021 for a virtual visit (video or telephone).   Piedad Climes, [place]   Date: 03/23/2021 7:35 AM  Virtual Visit via Video Note   I, Piedad Climes, connected with  Scott Gordon  ([id number], December 13, 1990) on 03/23/21 at  7:30 AM EDT by a video-enabled telemedicine application and verified that I am speaking with the correct person using two identifiers.  Location: Patient: Virtual Visit Location Patient: Home Provider: Virtual Visit Location Provider: Home Office   I discussed the limitations of evaluation and  management by telemedicine and the availability of in person appointments. The patient expressed understanding and agreed to proceed.    History of Present Illness: Scott Gordon is a 30 y.o. who identifies as a male who was assigned male at birth, and is being seen today for ongoing and worsening night sweats and fatigue. Patient endorses history of night sweats starting a couple of years ago. Initially very sporadic and thought to be related to temperature in bedroom. As such he made changes such as turning down thermostat to the 60s, sleeping naked and having very cool linens. Initially helpful but now night sweats have been occurring about 3-4 times per week. Wakes up completely drenched multiple times per night having to go wipe down. States it is so severe he has almost ruined a mattress. This is not associated with any known fevers but he has noted significant fatigue and a 30 pound weight loss in the past few months. Denies daytime sweats. Has history of MDD and GAD, formerly followed by psychiatry but lost his insurance and as such has been out of medications for a few months. Notes increase in anxiety but otherwise mental health has been stable overall. Does not endorse SI/HI. Notes mother with history of Non-Hodgkin's lymphoma. In terms of weight loss he noted episodic anorexia and nausea but denies any ongoing abdominal pain. Did have mild abdominal discomfort with  diarrhea and vomiting last week which has resolved. Was evaluated at ER at that time in which workup including labs and CT was unremarkable overall except for mild leukocytosis (13.3) and Neutrophilia with UDS + THC. He states he knows he needs a further evaluation but is unsure of what to do since he is without insurance at present.    HPI: HPI  Problems:  Patient Active Problem List   Diagnosis Date Noted   Severe major depression without psychotic features (HCC) 02/05/2021   Moderate episode of recurrent major depressive  disorder (HCC) 06/03/2020   GAD (generalized anxiety disorder) 06/03/2020   Elevated CK 06/14/2018   Non-traumatic rhabdomyolysis    Abdominal pain     Allergies:  Allergies  Allergen Reactions   Pineapple Hives   Medications:  Current Outpatient Medications:    FLUoxetine (PROZAC) 20 MG capsule, TAKE 1 CAPSULE BY MOUTH EVERY DAY, Disp: 30 capsule, Rfl: 2   promethazine (PHENERGAN) 25 MG tablet, Take 1 tablet (25 mg total) by mouth every 6 (six) hours as needed for nausea or vomiting., Disp: 21 tablet, Rfl: 0   QUEtiapine (SEROQUEL) 50 MG tablet, Take 1 tablet (50 mg total) by mouth at bedtime., Disp: 60 tablet, Rfl: 2   traZODone (DESYREL) 50 MG tablet, Take 1 tablet (50 mg total) by mouth at bedtime as needed. for sleep, Disp: 90 tablet, Rfl: 2   triamcinolone cream (KENALOG) 0.1 %, Apply 1 application topically 2 (two) times daily., Disp: 60 g, Rfl: 0  Observations/Objective: Patient is well-developed, well-nourished in no acute distress.  Resting comfortably at home.  Head is normocephalic, atraumatic.  No labored breathing. Speech is clear and coherent with logical content.  Patient is alert and oriented at baseline.  Mood and affect congruent. Not anxious-appearing. Well-groomed.   Assessment and Plan: 1. Chronic night sweats 2. Weight loss Ongoing, deteriorating over past few months. Has been off of psychiatric medications during this time but if related to withdrawal, things would be improving now that he is well-outside that window. Mood seems stable at present which is great. Giving ongoing symptoms he needs further evaluation above the scope of what we are allowed to do via VUC visit. He is without insurance so getting in with PCP has been a challenge. He also cannot afford multiple Urgent Care and ER visits. Will have our administrators reach out to [facility] and other [facility] practices to see if we can facilitate an appointment for him within the week. He  needs repeat CBC w diff and evaluation of TSH at the very least to start with so discussed if unable to get him in this week, he does need to go to urgent care to get those levels checked. He voiced understanding and agreement with the plan.    Follow Up Instructions: I discussed the assessment and treatment plan with the patient. The patient was provided an opportunity to ask questions and all were answered. The patient agreed with the plan and demonstrated an understanding of the instructions.  A copy of instructions were sent to the patient via MyChart.  The patient was advised to call back or seek an in-person evaluation if the symptoms worsen or if the condition fails to improve as anticipated.  Time:  I spent 18 minutes with the patient via telehealth technology discussing the above problems/concerns.    Piedad Climes, PA-C

## 2021-03-23 NOTE — Progress Notes (Signed)
New Patient Office Visit  Subjective:  Patient ID: Scott Gordon, male    DOB: [birth year]  Age: 30 y.o. MRN: [id number]  CC:  Chief Complaint  Patient presents with   Follow-up    ED    HPI [name] reports that he was seen for a virtual visit earlier today and encouraged to present for a physical examination.  Reports that he has been having increased night sweats and weight loss.  Reports he also has been having increased depression and anxiety.  Reports that he has been having night sweats for the past couple of years.  Reports that it previously would occur 2-3 times a month, but in the last month it has become much worse.  Reports that 3-4 times a week he will wake up in puddles of water.  Reports that he has a memory foam mattress which he believes has been ruined due to his excessive sweating.  Denies cough, known exposure to TB.  Reports that he keeps his room cool.  Reports that he has lost 30 pounds in the last 2 months.  Reports that his appetite has not changed with the exception of the last week and a half.  He reports in the last week and a half he has had increased stressors and anxiety and has had very low appetite.  Reports that his appetite has improved in the past 2 days and has not had reports that he has tried taking a liquid antacid, Pepto-Bismol and Tums and little relief.  Reports significant history of depression and anxiety.  Reports that he has failed many different medications, including gabapentin, hydroxyzine, Lexapro and Wellbutrin.  Reports that he was started on Prozac and increased to 20 mg recently, states that he took it for 4 months and did not notice any difference.  Reports that he has been out of this medication for the past week and a half.  Reports that he has been taking trazodone 50 mg, states that it does offer relief on occasion.  Reports that he was encouraged to hold the trazodone and start Seroquel 50 mg, states that he started  this a little over a month ago, did take it for 1 month, did not improve his sleep, did not make any difference in his moods.  Reports that he also stopped this approximately 1-1/2 weeks ago.  Reports he has not noticed any difference in his moods or anxiety with and without the Seroquel and Prozac.  Reports passive thoughts of he would be better off dead, adamantly denies any thoughts /plans of self-harm or harming others.    Past Medical History:  Diagnosis Date   Bipolar 2 disorder [facility] 2)     Past Surgical History:  Procedure Laterality Date   WISDOM TOOTH EXTRACTION      Family History  Family history unknown: Yes    Social History   Socioeconomic History   Marital status: Significant Other    Spouse name: Not on file   Number of children: Not on file   Years of education: Not on file   Highest education level: Not on file  Occupational History   Not on file  Tobacco Use   Smoking status: Never   Smokeless tobacco: Never  Vaping Use   Vaping Use: Every day   Substances: Nicotine, Flavoring  Substance and Sexual Activity   Alcohol use: Not Currently   Drug use: Yes    Types: Marijuana   Sexual activity: Yes  Birth control/protection: Condom  Other Topics Concern   Not on file  Social History Narrative   Not on file   Social Determinants of Health   Financial Resource Strain: Medium Risk   Difficulty of Paying Living Expenses: Somewhat hard  Food Insecurity: No Food Insecurity   Worried About Programme researcher, broadcasting/film/video in the Last Year: Never true   Ran Out of Food in the Last Year: Never true  Transportation Needs: No Transportation Needs   Lack of Transportation (Medical): No   Lack of Transportation (Non-Medical): No  Physical Activity: Insufficiently Active   Days of Exercise per Week: 1 day   Minutes of Exercise per Session: 30 min  Stress: Stress Concern Present   Feeling of Stress : Very much  Social Connections: Socially Isolated   Frequency of  Communication with Friends and Family: Twice a week   Frequency of Social Gatherings with Friends and Family: Three times a week   Attends Religious Services: Never   Active Member of Clubs or Organizations: No   Attends Banker Meetings: Never   Marital Status: Never married  Catering manager Violence: Not At Risk   Fear of Current or Ex-Partner: No   Emotionally Abused: No   Physically Abused: No   Sexually Abused: No    ROS Review of Systems  Constitutional:  Positive for diaphoresis and unexpected weight change.  HENT: Negative.    Eyes: Negative.   Respiratory: Negative.    Cardiovascular:  Negative for chest pain.  Gastrointestinal:  Positive for nausea and vomiting.  Endocrine: Negative.   Genitourinary: Negative.   Musculoskeletal: Negative.   Skin: Negative.   Allergic/Immunologic: Negative.   Neurological: Negative.   Hematological: Negative.   Psychiatric/Behavioral:  Positive for dysphoric mood and sleep disturbance. Negative for self-injury and suicidal ideas. The patient is nervous/anxious.    Objective:   Today's Vitals: BP 117/70 (BP Location: Left Arm, Patient Position: Sitting, Cuff Size: Normal)   Pulse 74   Temp 98.2 F (36.8 C) (Oral)   Resp 18   Ht 5\' 7"  (1.702 m)   Wt 129 lb (58.5 kg)   SpO2 98%   BMI 20.20 kg/m   Physical Exam Vitals and nursing note reviewed.  Constitutional:      Appearance: Normal appearance.  HENT:     Head: Normocephalic and atraumatic.     Right Ear: External ear normal.     Left Ear: External ear normal.     Nose: Nose normal.     Mouth/Throat:     Mouth: Mucous membranes are moist.     Pharynx: Oropharynx is clear.  Eyes:     Extraocular Movements: Extraocular movements intact.     Conjunctiva/sclera: Conjunctivae normal.     Pupils: Pupils are equal, round, and reactive to light.  Cardiovascular:     Rate and Rhythm: Normal rate and regular rhythm.     Pulses: Normal pulses.     Heart sounds:  Normal heart sounds.  Pulmonary:     Effort: Pulmonary effort is normal.     Breath sounds: Normal breath sounds.  Musculoskeletal:        General: Normal range of motion.     Cervical back: Normal range of motion and neck supple.  Skin:    General: Skin is warm and dry.  Neurological:     General: No focal deficit present.     Mental Status: He is alert and oriented to person, place, and time.  Psychiatric:  Mood and Affect: Mood normal.        Behavior: Behavior normal.        Thought Content: Thought content normal.        Judgment: Judgment normal.    Assessment & Plan:   Problem List Items Addressed This Visit       Other   GAD (generalized anxiety disorder)   Relevant Medications   traZODone (DESYREL) 100 MG tablet   Severe major depression without psychotic features (HCC)   Relevant Medications   ARIPiprazole (ABILIFY) 5 MG tablet   traZODone (DESYREL) 100 MG tablet   Other Relevant Orders   Vitamin D, 25-hydroxy (Completed)   Night sweats - Primary   Relevant Orders   CBC with Differential/Platelet (Completed)   Comp. Metabolic Panel (12) (Completed)   DG Chest 2 View (Completed)   HIV antibody (with reflex) (Completed)   PPD (Completed)   Loss of weight   Relevant Orders   TSH (Completed)   Leukocytosis   BMI 20.0-20.9, adult    Outpatient Encounter Medications as of 03/23/2021  Medication Sig   ARIPiprazole (ABILIFY) 5 MG tablet Take 1 tablet (5 mg total) by mouth daily.   FLUoxetine (PROZAC) 20 MG capsule TAKE 1 CAPSULE BY MOUTH EVERY DAY   QUEtiapine (SEROQUEL) 50 MG tablet Take 1 tablet (50 mg total) by mouth at bedtime.   traZODone (DESYREL) 50 MG tablet Take 1 tablet (50 mg total) by mouth at bedtime as needed. for sleep   traZODone (DESYREL) 100 MG tablet Take 1 tablet (100 mg total) by mouth at bedtime as needed. for sleep   No facility-administered encounter medications on file as of 03/23/2021.   1. Night sweats Evaluate further, I  will rule out TB, HIV. - CBC with Differential/Platelet - Comp. Metabolic Panel (12) - DG Chest 2 View; Future - HIV antibody (with reflex) - PPD  2. Loss of weight Patient had reported weight May 02, 2020 of 140, February 04, 2021 135, today's weight 129  - TSH  3. Severe major depression without psychotic features (HCC) Trial Abilify, increase trazodone 100 mg. - Vitamin D, 25-hydroxy - ARIPiprazole (ABILIFY) 5 MG tablet; Take 1 tablet (5 mg total) by mouth daily.  Dispense: 30 tablet; Refill: 1 - traZODone (DESYREL) 100 MG tablet; Take 1 tablet (100 mg total) by mouth at bedtime as needed. for sleep  Dispense: 30 tablet; Refill: 1  4. GAD (generalized anxiety disorder)   5. Leukocytosis, unspecified type   6. BMI 20.0-20.9, adult   Patient was given application for [place] financial assistance, [facility] to help patient with medication cost.  Patient to return to mobile unit in 2 days for TB reading and to review lab results.  Patient to return to mobile unit in 2 weeks for follow-up.  Patient was given an appointment to establish care at [facility] on May 31, 2021.  Red flags given for prompt reevaluation.   I have reviewed the patient's medical history (PMH, PSH, Social History, Family History, Medications, and allergies) , and have been updated if relevant. I spent 40 minutes reviewing chart and  face to face time with patient.    Follow-up: Return in about 2 days (around 03/25/2021).   Kasandra Knudsen Mayers, PA-C

## 2021-03-23 NOTE — Progress Notes (Signed)
Patient presents for FU. Patient has not eaten today and patient has not taken medication today. Patient denies pain at this time.

## 2021-03-23 NOTE — Patient Instructions (Signed)
You are going to start taking Abilify 5 mg once a day.  You will also increase your trazodone to 100 mg at bedtime.  I do encourage you to increase your water intake to 64 ounces a day.  You will return to the mobile unit in 2 days for [place] to check your TB skin test.  We will review your other lab results during that visit.  You will then return to the mobile unit in 2 weeks for follow-up and medication management.  Please let me know if there is anything else we can do for you.  Roney Jaffe, PA-C Physician Assistant [facility] [email or web address]   Managing Anxiety, Adult After being diagnosed with an anxiety disorder, you may be relieved to know why you have felt or behaved a certain way. You may also feel overwhelmed about the treatment ahead and what it will mean for your life. With care and support, youcan manage this condition and recover from it. How to manage lifestyle changes Managing stress and anxiety  Stress is your body's reaction to life changes and events, both good and bad. Most stress will last just a few hours, but stress can be ongoing and can lead to more than just stress. Although stress can play a major role in anxiety, it is not the same as anxiety. Stress is usually caused by something external, such as a deadline, test, or competition. Stress normally passes after thetriggering event has ended.  Anxiety is caused by something internal, such as imagining a terrible outcome or worrying that something will go wrong that will devastate you. Anxiety often does not go away even after the triggering event is over, and it can become long-term (chronic) worry. It is important to understand the differences between stress and anxiety and to manage your stress effectively so that it does not lead to ananxious response. Talk with your health care provider or a counselor to learn more about reducing anxiety and stress. He or  she may suggest tension reduction techniques, such as: Music therapy. This can include creating or listening to music that you enjoy and that inspires you. Mindfulness-based meditation. This involves being aware of your normal breaths while not trying to control your breathing. It can be done while sitting or walking. Centering prayer. This involves focusing on a word, phrase, or sacred image that means something to you and brings you peace. Deep breathing. To do this, expand your stomach and inhale slowly through your nose. Hold your breath for 3-5 seconds. Then exhale slowly, letting your stomach muscles relax. Self-talk. This involves identifying thought patterns that lead to anxiety reactions and changing those patterns. Muscle relaxation. This involves tensing muscles and then relaxing them. Choose a tension reduction technique that suits your lifestyle and personality. These techniques take time and practice. Set aside 5-15 minutes a day to do them. Therapists can offer counseling and training in these techniques. The training to help with anxiety may be covered by some insurance plans. Other things you can do to manage stress and anxiety include: Keeping a stress/anxiety diary. This can help you learn what triggers your reaction and then learn ways to manage your response. Thinking about how you react to certain situations. You may not be able to control everything, but you can control your response. Making time for activities that help you relax and not feeling guilty about spending your time in this way. Visual imagery and yoga can help you stay calm and relax.  Medicines  Medicines can help ease symptoms. Medicines for anxiety include: Anti-anxiety drugs. Antidepressants. Medicines are often used as a primary treatment for anxiety disorder. Medicines will be prescribed by a health care provider. When used together, medicines, psychotherapy, and tension reduction techniques may be the most  effectivetreatment. Relationships Relationships can play a big part in helping you recover. Try to spend more time connecting with trusted friends and family members. Consider going to couples counseling, taking family education classes, or going to familytherapy. Therapy can help you and others better understand your condition. How to recognize changes in your anxiety Everyone responds differently to treatment for anxiety. Recovery from anxiety happens when symptoms decrease and stop interfering with your daily activities at home or work. This may mean that you will start to: Have better concentration and focus. Worry will interfere less in your daily thinking. Sleep better. Be less irritable. Have more energy. Have improved memory. It is important to recognize when your condition is getting worse. Contact your health care provider if your symptoms interfere with home or work and you feellike your condition is not improving. Follow these instructions at home: Activity Exercise. Most adults should do the following: Exercise for at least 150 minutes each week. The exercise should increase your heart rate and make you sweat (moderate-intensity exercise). Strengthening exercises at least twice a week. Get the right amount and quality of sleep. Most adults need 7-9 hours of sleep each night. Lifestyle  Eat a healthy diet that includes plenty of vegetables, fruits, whole grains, low-fat dairy products, and lean protein. Do not eat a lot of foods that are high in solid fats, added sugars, or salt. Make choices that simplify your life. Do not use any products that contain nicotine or tobacco, such as cigarettes, e-cigarettes, and chewing tobacco. If you need help quitting, ask your health care provider. Avoid caffeine, alcohol, and certain over-the-counter cold medicines. These may make you feel worse. Ask your pharmacist which medicines to avoid.  General instructions Take over-the-counter and  prescription medicines only as told by your health care provider. Keep all follow-up visits as told by your health care provider. This is important. Where to find support You can get help and support from these sources: Self-help groups. Online and [employer]. A trusted spiritual leader. Couples counseling. Family education classes. Family therapy. Where to find more information You may find that joining a support group helps you deal with your anxiety. The following sources can help you locate counselors or support groups near you: Mental Health America: www.mentalhealthamerica.net Anxiety and Depression Association of [place] (ADAA): ProgramCam.de [employer] on Mental Illness (NAMI): www.nami.org Contact a health care provider if you: Have a hard time staying focused or finishing daily tasks. Spend many hours a day feeling worried about everyday life. Become exhausted by worry. Start to have headaches, feel tense, or have nausea. Urinate more than normal. Have diarrhea. Get help right away if you have: A racing heart and shortness of breath. Thoughts of hurting yourself or others. If you ever feel like you may hurt yourself or others, or have thoughts about taking your own life, get help right away. You can go to your nearest emergency department or call: Your local emergency services (911 in the [place] A suicide crisis helpline, such as the National Suicide Prevention Lifeline at 423-464-4745. This is open 24 hours a day. Summary Taking steps to learn and use tension reduction techniques can help calm you and help prevent triggering an anxiety reaction. When used  together, medicines, psychotherapy, and tension reduction techniques may be the most effective treatment. Family, friends, and partners can play a big part in helping you recover from an anxiety disorder. This information is not intended to replace advice given to you by your health care provider. Make  sure you discuss any questions you have with your healthcare provider. Document Revised: 01/22/2019 Document Reviewed: 01/22/2019 Elsevier Patient Education  2022 [employer].

## 2021-03-24 ENCOUNTER — Encounter: Payer: Self-pay | Admitting: Physician Assistant

## 2021-03-24 DIAGNOSIS — D72829 Elevated white blood cell count, unspecified: Secondary | ICD-10-CM | POA: Insufficient documentation

## 2021-03-24 DIAGNOSIS — Z6820 Body mass index (BMI) 20.0-20.9, adult: Secondary | ICD-10-CM | POA: Insufficient documentation

## 2021-03-24 DIAGNOSIS — R634 Abnormal weight loss: Secondary | ICD-10-CM | POA: Insufficient documentation

## 2021-03-24 DIAGNOSIS — R61 Generalized hyperhidrosis: Secondary | ICD-10-CM | POA: Insufficient documentation

## 2021-03-24 LAB — COMP. METABOLIC PANEL (12)
AST: 18 IU/L (ref 0–40)
Albumin/Globulin Ratio: 2 (ref 1.2–2.2)
Albumin: 4.9 g/dL (ref 4.1–5.2)
Alkaline Phosphatase: 59 IU/L (ref 44–121)
BUN/Creatinine Ratio: 11 (ref 9–20)
BUN: 9 mg/dL (ref 6–20)
Bilirubin Total: 1.1 mg/dL (ref 0.0–1.2)
Calcium: 9.7 mg/dL (ref 8.7–10.2)
Chloride: 103 mmol/L (ref 96–106)
Creatinine, Ser: 0.85 mg/dL (ref 0.76–1.27)
Globulin, Total: 2.5 g/dL (ref 1.5–4.5)
Glucose: 80 mg/dL (ref 65–99)
Potassium: 4.4 mmol/L (ref 3.5–5.2)
Sodium: 142 mmol/L (ref 134–144)
Total Protein: 7.4 g/dL (ref 6.0–8.5)
eGFR: 120 mL/min/{1.73_m2} (ref >59)

## 2021-03-24 LAB — CBC WITH DIFFERENTIAL/PLATELET
Basophils Absolute: 0 10*3/uL (ref 0.0–0.2)
Basos: 1 %
EOS (ABSOLUTE): 0.1 10*3/uL (ref 0.0–0.4)
Eos: 1 %
Hematocrit: 47 % (ref 37.5–51.0)
Hemoglobin: 15.6 g/dL (ref 13.0–17.7)
Immature Grans (Abs): 0 10*3/uL (ref 0.0–0.1)
Immature Granulocytes: 0 %
Lymphocytes Absolute: 1.4 10*3/uL (ref 0.7–3.1)
Lymphs: 21 %
MCH: 27.9 pg (ref 26.6–33.0)
MCHC: 33.2 g/dL (ref 31.5–35.7)
MCV: 84 fL (ref 79–97)
Monocytes Absolute: 0.5 10*3/uL (ref 0.1–0.9)
Monocytes: 7 %
Neutrophils Absolute: 4.5 10*3/uL (ref 1.4–7.0)
Neutrophils: 70 %
Platelets: 209 10*3/uL (ref 150–450)
RBC: 5.59 x10E6/uL (ref 4.14–5.80)
RDW: 13 % (ref 11.6–15.4)
WBC: 6.5 10*3/uL (ref 3.4–10.8)

## 2021-03-24 LAB — HIV ANTIBODY (ROUTINE TESTING W REFLEX): HIV Screen 4th Generation wRfx: NONREACTIVE

## 2021-03-24 LAB — VITAMIN D 25 HYDROXY (VIT D DEFICIENCY, FRACTURES): Vit D, 25-Hydroxy: 24.6 ng/mL — ABNORMAL LOW (ref 30.0–100.0)

## 2021-03-24 LAB — TSH: TSH: 0.982 u[IU]/mL (ref 0.450–4.500)

## 2021-03-26 ENCOUNTER — Other Ambulatory Visit (HOSPITAL_COMMUNITY): Payer: Self-pay | Admitting: Psychiatry

## 2021-03-26 DIAGNOSIS — F411 Generalized anxiety disorder: Secondary | ICD-10-CM

## 2021-03-31 ENCOUNTER — Telehealth: Payer: Self-pay | Admitting: *Deleted

## 2021-03-31 NOTE — Telephone Encounter (Signed)
-----   Message from Roney Jaffe, [place] sent at 03/25/2021  6:19 PM EDT ----- Please call patient and let him know that his chest x-ray was within normal limits, thyroid function, kidney function liver function are within normal limits.  He does not show signs of anemia.  His vitamin D is low, he should take an over-the-counter supplement of 2000 international units on a daily basis.

## 2021-03-31 NOTE — Telephone Encounter (Signed)
Patient verified DOB Patient is aware of labs being normal except for vitamin d. Patient is taking a MV that contains 2000 IU of vitamin d. Patient still reports night sweats and would need his TB test placed again due to missing the 48hr read time. Patient will call [facility] on Monday to have a FU with [facility].

## 2021-04-23 ENCOUNTER — Telehealth (HOSPITAL_COMMUNITY): Payer: Self-pay | Admitting: Psychiatry

## 2021-04-23 NOTE — Telephone Encounter (Signed)
Patient wants provider notified he will be talking with provider about ADD at next appt 05/03/21.

## 2021-04-27 ENCOUNTER — Other Ambulatory Visit: Payer: Self-pay

## 2021-05-03 ENCOUNTER — Encounter (HOSPITAL_COMMUNITY): Payer: Self-pay | Admitting: Psychiatry

## 2021-05-03 ENCOUNTER — Other Ambulatory Visit: Payer: Self-pay

## 2021-05-03 ENCOUNTER — Telehealth (INDEPENDENT_AMBULATORY_CARE_PROVIDER_SITE_OTHER): Payer: No Payment, Other | Admitting: Psychiatry

## 2021-05-03 DIAGNOSIS — F1220 Cannabis dependence, uncomplicated: Secondary | ICD-10-CM | POA: Diagnosis not present

## 2021-05-03 DIAGNOSIS — F411 Generalized anxiety disorder: Secondary | ICD-10-CM

## 2021-05-03 DIAGNOSIS — F17203 Nicotine dependence unspecified, with withdrawal: Secondary | ICD-10-CM

## 2021-05-03 DIAGNOSIS — F1994 Other psychoactive substance use, unspecified with psychoactive substance-induced mood disorder: Secondary | ICD-10-CM

## 2021-05-03 DIAGNOSIS — F900 Attention-deficit hyperactivity disorder, predominantly inattentive type: Secondary | ICD-10-CM | POA: Diagnosis not present

## 2021-05-03 MED ORDER — ATOMOXETINE HCL 40 MG PO CAPS
40.0000 mg | ORAL_CAPSULE | Freq: Every day | ORAL | 3 refills | Status: DC
Start: 2021-05-03 — End: 2021-09-30
  Filled 2021-05-03: qty 30, 30d supply, fill #0

## 2021-05-03 MED ORDER — TRAZODONE HCL 100 MG PO TABS
100.0000 mg | ORAL_TABLET | Freq: Every evening | ORAL | 3 refills | Status: DC | PRN
  Filled 2021-05-03: qty 30, 30d supply, fill #0

## 2021-05-03 MED ORDER — NICOTINE 21 MG/24HR TD PT24
21.0000 mg | MEDICATED_PATCH | Freq: Every day | TRANSDERMAL | 3 refills | Status: DC
  Filled 2021-05-03: qty 28, 28d supply, fill #0

## 2021-05-03 MED ORDER — QUETIAPINE FUMARATE 50 MG PO TABS
50.0000 mg | ORAL_TABLET | Freq: Every day | ORAL | 3 refills | Status: DC
  Filled 2021-05-03: qty 30, 30d supply, fill #0

## 2021-05-03 NOTE — Progress Notes (Signed)
BH MD/PA/NP OP Progress Note Virtual Visit via Video Note  I connected with Scott Gordon on 05/03/21 at  3:30 PM EDT by a video enabled telemedicine application and verified that I am speaking with the correct person using two identifiers.  Location: Patient: Home Provider: Clinic   I discussed the limitations of evaluation and management by telemedicine and the availability of in person appointments. The patient expressed understanding and agreed to proceed.  I provided 30 minutes of non-face-to-face time during this encounter.     05/03/2021 4:31 PM Scott Gordon  MRN:  [id number]  Chief Complaint: "I'm not doing well and I am under a lot of stress"   HPI: 30 year old male seen today for follow up psychiatric evaluation.He has a psychiatric history of bipolar 2, depression and anxiety.  He is currently managed on Trazodone 50 mg nightly as needed, Prozac 20 mg daily, Seroquel 50 mg, and hydroxyzine 25 mg three times daily. He notes that he recently went to his PCP and was started on Abilify 5 mg and trazodone was increased to 100 mg.  He notes that Seroquel was discontinued as well as Prozac.  He informed Clinical research associate that he prefers Seroquel over Abilify.  He notes his medications are somewhat effective in managing his psychiatric conditions.   Today he is well groomed, pleasant, cooperative, engaged in conversation and maintained eye contact. He informed provider that since his last visit he has not been doing well.  He notes that he is under a lot of stress.  Patient informed Clinical research associate that currently he is unemployed, worried about finances, the health of his mother (who has cancer and has been declining), and is currently moving.  He notes that he is moving back in with his mother.  Patient informed writer that the above exacerbates his anxiety and depression.  Provider conducted a GAD-7 and patient scored a 15, at his last visit he scored a 15.  Provider also conducted a PHQ-9 and  patient scored a 22, at his last visit he scored a 24.  He informed Clinical research associate that since his trazodone has been increased he has been sleeping approximately 6 hours nightly.  Today he denies SI/HI/VAH or paranoia.  Patient notes that when he last experienced a hallucination it was due to extreme anxiety.    Patient informed Clinical research associate that he has increased his marijuana consumption.  He notes that he is irritable, distractible, has racing thoughts, and fluctuations in mood.  He denies grandiosity or impulsiveness.  Provider informed patient that marijuana can induce the above symptoms.  He endorsed understanding and agreed.  Patient also notes that he is concerned that he may have ADHD as he is distractible, forgetful, avoids mentally taxing task, is inattentive, and has poor listening skills.  Patient notes that in the past he was on Wellbutrin and found it effective for helping his depression as well as concentration however he notes that after taking it for 3 months it caused severe headaches for over a week.  Patient also informed writer that he vapes tobacco to help manage his stress.  He notes that this is problematic and would like to stop.  Today patient is agreeable to discontinuing Abilify and restarting Seroquel 50 mg nightly to help manage sleep, mood, and anxiety.  He is also agreeable to starting Strattera 40 mg to help manage symptoms of ADHD.  Patient is also agreeable to starting NicoDerm CQ 21 mg patches daily to help manage nicotine dependence.  Potential side effects  of medication and risks vs benefits of treatment vs non-treatment were explained and discussed. All questions were answered. He will continue all other medications as prescribed and follow-up with outpatient counseling for therapy.  No other concerns noted at this time.      Visit Diagnosis:    ICD-10-CM   1. Substance induced mood disorder (HCC)  F19.94 QUEtiapine (SEROQUEL) 50 MG tablet    traZODone (DESYREL) 100 MG tablet     2. GAD (generalized anxiety disorder)  F41.1 QUEtiapine (SEROQUEL) 50 MG tablet    traZODone (DESYREL) 100 MG tablet    3. Marijuana dependence (HCC)  F12.20     4. Attention deficit hyperactivity disorder (ADHD), predominantly inattentive type  F90.0 atomoxetine (STRATTERA) 40 MG capsule    5. Nicotine dependence with withdrawal, unspecified nicotine product type  F17.203 nicotine (NICODERM CQ) 21 mg/24hr patch      Past Psychiatric History: bipolar 2, depression and anxiety  Past Medical History:  Past Medical History:  Diagnosis Date   Bipolar 2 disorder (HCC)     Past Surgical History:  Procedure Laterality Date   WISDOM TOOTH EXTRACTION      Family Psychiatric History: Notes that his family has mental health conditions but they are not diagnosed.     Family History:  Family History  Family history unknown: Yes    Social History:  Social History   Socioeconomic History   Marital status: Significant Other    Spouse name: Not on file   Number of children: Not on file   Years of education: Not on file   Highest education level: Not on file  Occupational History   Not on file  Tobacco Use   Smoking status: Never   Smokeless tobacco: Never  Vaping Use   Vaping Use: Every day   Substances: Nicotine, Flavoring  Substance and Sexual Activity   Alcohol use: Not Currently   Drug use: Yes    Types: Marijuana   Sexual activity: Yes    Birth control/protection: Condom  Other Topics Concern   Not on file  Social History Narrative   Not on file   Social Determinants of Health   Financial Resource Strain: Medium Risk   Difficulty of Paying Living Expenses: Somewhat hard  Food Insecurity: No Food Insecurity   Worried About Programme researcher, broadcasting/film/videounning Out of Food in the Last Year: Never true   Ran Out of Food in the Last Year: Never true  Transportation Needs: No Transportation Needs   Lack of Transportation (Medical): No   Lack of Transportation (Non-Medical): No  Physical  Activity: Insufficiently Active   Days of Exercise per Week: 1 day   Minutes of Exercise per Session: 30 min  Stress: Stress Concern Present   Feeling of Stress : Very much  Social Connections: Socially Isolated   Frequency of Communication with Friends and Family: Twice a week   Frequency of Social Gatherings with Friends and Family: Three times a week   Attends Religious Services: Never   Active Member of Clubs or Organizations: No   Attends BankerClub or Organization Meetings: Never   Marital Status: Never married    Allergies:  Allergies  Allergen Reactions   Pineapple Hives    Metabolic Disorder Labs: No results found for: HGBA1C, MPG No results found for: PROLACTIN No results found for: CHOL, TRIG, HDL, CHOLHDL, VLDL, LDLCALC Lab Results  Component Value Date   TSH 0.982 03/23/2021    Therapeutic Level Labs: No results found for: LITHIUM No results found  for: VALPROATE No components found for:  CBMZ  Current Medications: Current Outpatient Medications  Medication Sig Dispense Refill   atomoxetine (STRATTERA) 40 MG capsule Take 1 capsule (40 mg total) by mouth daily. 30 capsule 3   nicotine (NICODERM CQ) 21 mg/24hr patch Place 1 patch (21 mg total) onto the skin daily. 30 patch 3   QUEtiapine (SEROQUEL) 50 MG tablet Take 1 tablet (50 mg total) by mouth at bedtime. 60 tablet 3   traZODone (DESYREL) 100 MG tablet Take 1 tablet (100 mg total) by mouth at bedtime as needed. for sleep 30 tablet 3   No current facility-administered medications for this visit.     Musculoskeletal: Strength & Muscle Tone:  Unable to assess due to telehealth visit Gait & Station:  Unable to assess due to telehealth visit Patient leans: N/A  Psychiatric Specialty Exam: Review of Systems  There were no vitals taken for this visit.There is no height or weight on file to calculate BMI.  General Appearance: Well Groomed  Eye Contact:  Good  Speech:  Clear and Coherent and Normal Rate   Volume:  Normal  Mood:  Anxious and Depressed  Affect:  Appropriate and Congruent  Thought Process:  Coherent, Goal Directed and Linear  Orientation:  Full (Time, Place, and Person)  Thought Content: WDL and Logical   Suicidal Thoughts:  Yes.  without intent/plan  Homicidal Thoughts:  No  Memory:  Immediate;   Fair Recent;   Fair Remote;   Fair  Judgement:  Good  Insight:  Good  Psychomotor Activity:  Normal  Concentration:  Concentration: Fair and Attention Span: Fair  Recall:  Good  Fund of Knowledge: Good  Language: Good  Akathisia:  No  Handed:  Right  AIMS (if indicated): Not done  Assets:  Communication Skills Desire for Improvement Financial Resources/Insurance Housing Leisure Time Physical Health Social Support  ADL's:  Intact  Cognition: WNL  Sleep:  Fair   Screenings: AUDIT    Advertising copywriter from 06/03/2020 in [facility]  Alcohol Use Disorder Identification Test Final Score (AUDIT) 4      GAD-7    Flowsheet Row Video Visit from 05/03/2021 in [facility] Office Visit from 03/23/2021 in [place] [facility] Video Visit from 02/05/2021 in [facility] Office Visit from 07/02/2020 in [facility] Counselor from 06/03/2020 in [facility]  Total GAD-7 Score 15 20 15 14 16       PHQ2-9    Flowsheet Row Video Visit from 05/03/2021 in [facility] Office Visit from 03/23/2021 in [facility] Video Visit from 02/05/2021 in [facility] Office Visit from 07/02/2020 in [facility] Counselor from 06/03/2020 in [facility]  PHQ-2 Total Score 5 6 6 4 5   PHQ-9 Total Score 22 27 24 16 20       Flowsheet Row Video Visit from 05/03/2021 in [facility] ED from 03/15/2021 in [facility] from 03/05/2021 in [facility] RISK CATEGORY Error: Q7 should not be populated when Q6 is No No Risk No Risk        Assessment and Plan: Patient endorses symptoms of anxiety, depression, ADHD, nicotine dependence, and marijuana dependence. Today patient is agreeable to discontinuing Abilify and restarting Seroquel 50 mg nightly to help manage sleep,  mood, and anxiety.  He is also agreeable to starting Strattera 40 mg to help manage symptoms of ADHD.  Patient is also agreeable to starting NicoDerm CQ 21 mg patches daily to help manage nicotine dependence.   1. GAD (generalized anxiety disorder)  Restart- QUEtiapine (SEROQUEL) 50 MG tablet; Take 1 tablet (50 mg total) by mouth at bedtime.  Dispense: 60 tablet; Refill: 3 Continue- traZODone (DESYREL) 100 MG tablet; Take 1 tablet (100 mg total) by mouth at bedtime as needed. for sleep  Dispense: 30 tablet; Refill: 3  2. Marijuana dependence (HCC)   3. Attention deficit hyperactivity disorder (ADHD), predominantly inattentive type  Start- atomoxetine (STRATTERA) 40 MG capsule; Take 1 capsule (40 mg total) by mouth daily.  Dispense: 30 capsule; Refill: 3  4. Substance induced mood disorder (HCC)  Restart- QUEtiapine (SEROQUEL) 50 MG tablet; Take 1 tablet (50 mg total) by mouth at bedtime.  Dispense: 60 tablet; Refill: 3 Continue- traZODone (DESYREL) 100 MG tablet; Take 1 tablet (100 mg total) by mouth at bedtime as needed. for sleep  Dispense: 30 tablet; Refill: 3  5. Nicotine dependence with withdrawal, unspecified nicotine product type  Start- nicotine (NICODERM CQ) 21 mg/24hr patch; Place 1 patch (21 mg total) onto the skin daily.  Dispense: 30 patch; Refill: 3  Follow up in 3 months Follow up with therapy   Shanna Cisco, NP 05/03/2021, 4:31 PM

## 2021-05-04 ENCOUNTER — Other Ambulatory Visit: Payer: Self-pay

## 2021-05-06 ENCOUNTER — Other Ambulatory Visit: Payer: Self-pay

## 2021-05-25 ENCOUNTER — Ambulatory Visit (HOSPITAL_COMMUNITY)
Admission: RE | Admit: 2021-05-25 | Discharge: 2021-05-25 | Disposition: A | Discharge status: Home / Self Care | Payer: No Payment, Other | Attending: Psychiatry | Admitting: Psychiatry

## 2021-05-25 NOTE — H&P (Signed)
Behavioral Health Medical Screening Exam  Scott Gordon is a 30 y.o. male seen by this provider with TTS counselor as voluntary walk-in at [facility] accompanied by no one. Patient reports he has been having increasing suicidal thoughts over the last few months. 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   Endorses suicidal thoughts without plan, denies homicidal ideation, denies auditory and visual hallucinations. Able to contract for safety if he can get additional outpatient services in place.   Sees [name], NP for medication management, last seen 05/03/21. Restarted Seroquel after being off for only one week, started Strattera and has already stopped this due to side effects of increased feeling of urination. Next visit with [name] in Q 3 months. Sees therapist last was 2 weeks ago.   Denies previous inpatient psychiatric hospital stay. Denies previous suicide attempts. Protective factors include his significant others and would not want to leave them to deal with his death if he killed himself.   This patient would benefit from additional outpatient psychiatric services.   Total Time spent with patient: 30 minutes  Psychiatric Specialty Exam:  Presentation  General Appearance: Appropriate for Environment  Eye Contact:Good  Speech:Clear and Coherent; Normal Rate  Speech Volume:Normal  Handedness:Right   Mood and Affect  Mood:Depressed  Affect:Congruent   Thought Process  Thought Processes:Coherent; Goal Directed  Descriptions of Associations:Intact  Orientation:Full (Time, Place and Person)  Thought Content:Logical  History of Schizophrenia/Schizoaffective disorder:No  Duration of Psychotic Symptoms:No data recorded Hallucinations:Hallucinations: None Ideas of Reference:None  Suicidal Thoughts:Suicidal Thoughts: Yes, Passive SI Passive Intent  and/or Plan: Without Intent; Without Plan Homicidal Thoughts:Homicidal Thoughts: No  Sensorium  Memory:Immediate Good; Recent Good; Remote Good  Judgment:Good  Insight:Good   Executive Functions  Concentration:Good  Attention Span:Good  Recall:Good  Fund of Knowledge:Good  Language:Good   Psychomotor Activity  Psychomotor Activity: Psychomotor Activity: Normal  Assets  Assets:Communication Skills; Desire for Improvement; Financial Resources/Insurance; Housing; Leisure Time; Physical Health; Social Support; Transportation   Sleep  Sleep: Sleep: Fair Number of Hours of Sleep: 4   Physical Exam: Physical Exam Vitals reviewed.  Constitutional:      Appearance: Normal appearance.  Cardiovascular:     Rate and Rhythm: Normal rate.  Pulmonary:     Effort: Pulmonary effort is normal.     Breath sounds: Normal breath sounds.  Skin:    General: Skin is warm and dry.  Neurological:     Mental Status: He is alert and oriented to person, place, and time.  Psychiatric:        Attention and Perception: Attention normal.        Mood and Affect: Mood is depressed.        Speech: Speech normal.        Behavior: Behavior normal. Behavior is cooperative.        Thought Content: Thought content is not paranoid or delusional. Thought content does not include homicidal or suicidal ideation. Thought content does not include homicidal or suicidal plan.   Review of Systems  Constitutional:  Negative for chills and fever.  Respiratory:  Negative for shortness of breath.   Cardiovascular:  Negative for chest pain.  Gastrointestinal:  Negative for abdominal pain, nausea and vomiting.  Neurological:  Negative for headaches.  Psychiatric/Behavioral:  Positive for depression, substance abuse and suicidal ideas (no plan). Negative for hallucinations. The  patient has insomnia. The patient is not nervous/anxious.   Blood pressure 98/64, pulse 91, temperature 98.8 F (37.1 C),  temperature source Oral, resp. rate 18, SpO2 99 %. There is no height or weight on file to calculate BMI.  Musculoskeletal: Strength & Muscle Tone: within normal limits Gait & Station: normal Patient leans: N/A   Recommendations:  Based on my evaluation the patient does not appear to have an emergency medical condition. Safe for outpatient psychiatric treatment with resources provided for PHP. Patient is able to contract for safety and safety planning reviewed in detail. Agrees with plan to follow-up as outpatient. Information provided in written form per TTS counselor.   [name], NP 05/25/2021, 5:49 PM
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
## 2021-05-26 ENCOUNTER — Telehealth (HOSPITAL_COMMUNITY): Payer: Self-pay | Admitting: Professional

## 2021-05-27 NOTE — Progress Notes (Deleted)
Virtual Visit via Telephone Note  I connected with Scott Gordon, on 05/27/2021 at 8:18 AM by telephone due to the COVID-19 pandemic and verified that I am speaking with the correct person using two identifiers.  Due to current restrictions/limitations of in-office visits due to the COVID-19 pandemic, this scheduled clinical appointment was converted to a telehealth visit.   Consent: I discussed the limitations, risks, security and privacy concerns of performing an evaluation and management service by telephone and the availability of in person appointments. I also discussed with the patient that there may be a patient responsible charge related to this service. The patient expressed understanding and agreed to proceed.   Location of Patient: Home  Location of Provider: [facility]   Persons participating in Telemedicine visit: Scott Gordon Ricky Stabs, NP Margorie John, CMA   History of Present Illness: Scott Gordon is a 30 year-old male who presents to establish care.   Current issues and/or concerns:   Past Medical History:  Diagnosis Date   Bipolar 2 disorder (HCC)    Allergies  Allergen Reactions   Pineapple Hives    Current Outpatient Medications on File Prior to Visit  Medication Sig Dispense Refill   atomoxetine (STRATTERA) 40 MG capsule Take 1 capsule (40 mg total) by mouth daily. 30 capsule 3   nicotine (NICODERM CQ) 21 mg/24hr patch Place 1 patch (21 mg total) onto the skin daily. 30 patch 3   QUEtiapine (SEROQUEL) 50 MG tablet Take 1 tablet (50 mg total) by mouth at bedtime. 60 tablet 3   traZODone (DESYREL) 100 MG tablet Take 1 tablet (100 mg total) by mouth at bedtime as needed. for sleep 30 tablet 3   No current facility-administered medications on file prior to visit.    Observations/Objective: Alert and oriented x 3. Not in acute distress. Physical examination not completed as this is a telemedicine  visit.  Assessment and Plan: ***  Follow Up Instructions: ***   Patient was given clear instructions to go to Emergency Department or return to medical center if symptoms don't improve, worsen, or new problems develop.The patient verbalized understanding.  I discussed the assessment and treatment plan with the patient. The patient was provided an opportunity to ask questions and all were answered. The patient agreed with the plan and demonstrated an understanding of the instructions.   The patient was advised to call back or seek an in-person evaluation if the symptoms worsen or if the condition fails to improve as anticipated.     I provided *** minutes total of non-face-to-face time during this encounter.   Rema Fendt, NP  [facility] at [facility] [place], [place] [phone] 05/27/2021, 8:18 AM

## 2021-05-31 ENCOUNTER — Telehealth: Payer: Medicaid Other | Admitting: Family

## 2021-06-08 ENCOUNTER — Ambulatory Visit (HOSPITAL_COMMUNITY): Payer: No Payment, Other | Admitting: Professional

## 2021-06-08 ENCOUNTER — Telehealth (HOSPITAL_COMMUNITY): Payer: Self-pay | Admitting: Professional

## 2021-06-08 DIAGNOSIS — F322 Major depressive disorder, single episode, severe without psychotic features: Secondary | ICD-10-CM

## 2021-06-09 ENCOUNTER — Other Ambulatory Visit: Payer: Self-pay

## 2021-06-09 ENCOUNTER — Ambulatory Visit (INDEPENDENT_AMBULATORY_CARE_PROVIDER_SITE_OTHER): Payer: No Payment, Other | Admitting: Licensed Clinical Social Worker

## 2021-06-09 DIAGNOSIS — F411 Generalized anxiety disorder: Secondary | ICD-10-CM

## 2021-06-09 DIAGNOSIS — F322 Major depressive disorder, single episode, severe without psychotic features: Secondary | ICD-10-CM | POA: Diagnosis not present
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
## 2021-06-10 ENCOUNTER — Ambulatory Visit (HOSPITAL_COMMUNITY): Payer: No Payment, Other

## 2021-06-10 ENCOUNTER — Ambulatory Visit (INDEPENDENT_AMBULATORY_CARE_PROVIDER_SITE_OTHER): Payer: No Payment, Other | Admitting: Licensed Clinical Social Worker

## 2021-06-10 DIAGNOSIS — F322 Major depressive disorder, single episode, severe without psychotic features: Secondary | ICD-10-CM

## 2021-06-10 DIAGNOSIS — F411 Generalized anxiety disorder: Secondary | ICD-10-CM

## 2021-06-11 ENCOUNTER — Other Ambulatory Visit: Payer: Self-pay

## 2021-06-11 ENCOUNTER — Ambulatory Visit (HOSPITAL_COMMUNITY): Payer: No Payment, Other | Admitting: Licensed Clinical Social Worker

## 2021-06-11 ENCOUNTER — Ambulatory Visit (HOSPITAL_COMMUNITY): Payer: No Payment, Other

## 2021-06-11 DIAGNOSIS — F411 Generalized anxiety disorder: Secondary | ICD-10-CM

## 2021-06-11 DIAGNOSIS — F331 Major depressive disorder, recurrent, moderate: Secondary | ICD-10-CM

## 2021-06-11 MED ORDER — MIRTAZAPINE 7.5 MG PO TABS
7.5000 mg | ORAL_TABLET | Freq: Every day | ORAL | 0 refills | Status: DC
  Filled 2021-06-11: qty 30, 30d supply, fill #0
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
## 2021-06-12 ENCOUNTER — Encounter (HOSPITAL_COMMUNITY): Payer: Self-pay | Admitting: Professional

## 2021-06-14 ENCOUNTER — Ambulatory Visit (HOSPITAL_COMMUNITY): Payer: Self-pay

## 2021-06-14 ENCOUNTER — Ambulatory Visit (HOSPITAL_COMMUNITY): Payer: No Payment, Other

## 2021-06-14 ENCOUNTER — Telehealth (HOSPITAL_COMMUNITY): Payer: Self-pay | Admitting: Professional

## 2021-06-15 ENCOUNTER — Ambulatory Visit (HOSPITAL_COMMUNITY): Payer: No Payment, Other

## 2021-06-15 ENCOUNTER — Telehealth (HOSPITAL_COMMUNITY): Payer: Self-pay | Admitting: Professional

## 2021-06-15 ENCOUNTER — Ambulatory Visit (HOSPITAL_COMMUNITY): Payer: Self-pay

## 2021-06-16 ENCOUNTER — Ambulatory Visit (HOSPITAL_COMMUNITY): Payer: Self-pay

## 2021-06-16 ENCOUNTER — Ambulatory Visit (HOSPITAL_COMMUNITY): Payer: No Payment, Other

## 2021-06-17 ENCOUNTER — Ambulatory Visit (HOSPITAL_COMMUNITY): Payer: No Payment, Other

## 2021-06-17 ENCOUNTER — Ambulatory Visit (HOSPITAL_COMMUNITY): Payer: Self-pay

## 2021-06-18 ENCOUNTER — Ambulatory Visit (HOSPITAL_COMMUNITY): Payer: No Payment, Other

## 2021-06-18 ENCOUNTER — Ambulatory Visit (HOSPITAL_COMMUNITY): Payer: Self-pay

## 2021-06-18 ENCOUNTER — Other Ambulatory Visit: Payer: Self-pay

## 2021-06-20 NOTE — Progress Notes (Unsigned)
  [facility] Intensive Outpatient Program Discharge Summary  Scott Gordon [id number]  Admission date: 06/11/2021 Discharge date: 06/18/2021  Reason for admission:  per admission assessment note: Scott Gordon is a 30 year who was referred by his primary mental health provider due to worsening suicidal ideations and depression.  Reports struggling with depression and anxiety for the past 2 years.  States over the past 6 months things have become progressively worse.  Reports multiple psychosocial stressors.  States he currently resides with his mother.  Denies that he is currently employed.  Reports smoking marijuana daily.  Denied previous inpatient admissions.  Denies history of physical sexual abuse   Progress in Program Toward Treatment Goals: Ongoing, patient attended and participated presents sporadically.  Was not seen at discharge.  Patient to keep follow-up appointment with all outpatient providers.   Take all medications as prescribed. Keep all follow-up appointments as scheduled.  Do not consume alcohol or use illegal drugs while on prescription medications. Report any adverse effects from your medications to your primary care provider promptly.  In the event of recurrent symptoms or worsening symptoms, call 911, a crisis hotline, or go to the nearest emergency department for evaluation.    T.Ammie Warrick NP  06/20/2021

## 2021-06-21 ENCOUNTER — Ambulatory Visit (HOSPITAL_COMMUNITY): Payer: Self-pay

## 2021-06-21 ENCOUNTER — Ambulatory Visit (HOSPITAL_COMMUNITY): Payer: No Payment, Other

## 2021-06-22 ENCOUNTER — Ambulatory Visit (HOSPITAL_COMMUNITY): Payer: Self-pay

## 2021-06-22 ENCOUNTER — Ambulatory Visit (HOSPITAL_COMMUNITY): Payer: No Payment, Other

## 2021-06-23 ENCOUNTER — Ambulatory Visit (HOSPITAL_COMMUNITY): Payer: No Payment, Other

## 2021-06-23 ENCOUNTER — Ambulatory Visit (HOSPITAL_COMMUNITY): Payer: Self-pay

## 2021-07-13 NOTE — Psych (Signed)
Virtual Visit via Video Note  I connected with Scott Gordon on 06/09/21 at  9:05 AM EDT by a video enabled telemedicine application and verified that I am speaking with the correct person using two identifiers.  Location: Patient: patient home Provider: clinical home office   I discussed the limitations of evaluation and management by telemedicine and the availability of in person appointments. The patient expressed understanding and agreed to proceed.  I discussed the assessment and treatment plan with the patient. The patient was provided an opportunity to ask questions and all were answered. The patient agreed with the plan and demonstrated an understanding of the instructions.   The patient was advised to call back or seek an in-person evaluation if the symptoms worsen or if the condition fails to improve as anticipated.  Pt was provided 240 minutes of non-face-to-face time during this encounter.   Donia Guiles, LCSW    [facility] PHP THERAPIST PROGRESS NOTE  Scott Gordon [id number]  Session Time: 9:00 - 10:00  Participation Level: Active  Behavioral Response: CasualAlertDepressed  Type of Therapy: Group Therapy  Treatment Goals addressed: Coping  Interventions: CBT, DBT, Supportive, and Reframing  Summary: Clinician led check-in regarding current stressors and situation. Clinician utilized active listening and empathetic response and validated patient emotions. Clinician facilitated processing group on pertinent issues.  Therapist Response: Scott Gordon is a 30 y.o. male who presents with depression symptoms. Patient arrived within time allowed and reports that he is feeling "not bad." Patient rates his mood at a 6 on a scale of 1-10 with 10 being great. Pt reports he he has been experiencing "hyper-stress" over the past few days and he has struggled to manage symptoms. Pt states struggles with sleep and experiencing mild passive SI. Pt able to process.  Pt engaged in discussion.        Session Time: 10:00 - 11:00   Participation Level: Active   Behavioral Response: CasualAlertDepressed   Type of Therapy: Group Therapy   Treatment Goals addressed: Coping   Interventions: CBT, DBT, Supportive and Reframing   Summary: Cln led discussion on decision making and how to apply logic to fears. Group viewed TED talk "Why you should define your fears not your goals" to aid discussion. Group discussed ways to consider how we can address fears and make them manageable.     Therapist Response: Pt engaged in discussion and practices decision making model with group.  fears not goals TED talk        Session Time: 11:00 -12:00   Participation Level: Active   Behavioral Response: CasualAlertDepressed   Type of Therapy: Group therapy   Treatment Goals addressed: Coping   Interventions: Supportive   Summary: Chaplaincy group    Therapist Response: Pt engaged in discussion. See Chaplain note.    Session Time: 12:00- 1:00   Participation Level: Active   Behavioral Response: CasualAlertDepressed   Type of Therapy: Group Therapy   Treatment Goals addressed: Coping   Interventions: Psychosocial skills training, Supportive   Summary: Occupational Therapy group on wellness   Therapist Response: Patient engaged in group. 12:50 - 1:00: At check-out, patient rates his mood at a 7.5 on a scale of 1-10 with 10 being great. Pt reports afternoon plans of seeing a friend. Pt demonstrates some progress as evidenced by participating in first group session. Patient denies SI/HI at the end of group.    Suicidal/Homicidal: Nowithout intent/plan  Plan: Pt will continue in PHP while working to decrease depression  and anxiety, increase ability to manage symptoms in a healthy manner.   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 Severe major depression without psychotic features (HCC)   2. GAD  (generalized anxiety disorder)       Donia Guiles, LCSW 07/13/2021

## 2021-07-27 NOTE — Psych (Signed)
Virtual Visit via Video Note  I connected with [name] on 06/10/21 at  9:00 AM EDT by a video enabled telemedicine application and verified that I am speaking with the correct person using two identifiers.  Location: Patient: patient home Provider: clinical home office   I discussed the limitations of evaluation and management by telemedicine and the availability of in person appointments. The patient expressed understanding and agreed to proceed.  I discussed the assessment and treatment plan with the patient. The patient was provided an opportunity to ask questions and all were answered. The patient agreed with the plan and demonstrated an understanding of the instructions.   The patient was advised to call back or seek an in-person evaluation if the symptoms worsen or if the condition fails to improve as anticipated.  Pt was provided 240 minutes of non-face-to-face time during this encounter.   Donia Guiles, LCSW    [facility] PHP THERAPIST PROGRESS NOTE  JLYNN LANGILLE [id number]  Session Time: 9:00 - 10:00  Participation Level: Active  Behavioral Response: CasualAlertDepressed  Type of Therapy: Group Therapy  Treatment Goals addressed: Coping  Interventions: CBT, DBT, Supportive, and Reframing  Summary: Clinician led check-in regarding current stressors and situation. Clinician utilized active listening and empathetic response and validated patient emotions. Clinician facilitated processing group on pertinent issues.  Therapist Response: JAYMARI CROMIE is a 30 y.o. male who presents with depression symptoms. Patient arrived within time allowed and reports that he is feeling "draggy." Patient rates his mood at a 6 on a scale of 1-10 with 10 being great. Pt reports he struggled with anxiety and stress last night because he was worried about today. Pt able to process. Pt engaged in discussion.        Session Time: 10:00 - 11:00   Participation  Level: Active   Behavioral Response: CasualAlertDepressed   Type of Therapy: Group Therapy   Treatment Goals addressed: Coping   Interventions: CBT, DBT, Supportive and Reframing   Summary:  Cln led discussion on staying present. Cln highlighted CBT distorted thoughts of catastrophizing and fortune telling and encouraged pt's to think about today and tomorrow and not past that to address those distortions. Group members shared struggles they have with looking into the future.     Therapist Response:  Pt is able to process and discuss how to apply strategies discussed.            Session Time: 11:00- 12:00   Participation Level: Active   Behavioral Response: CasualAlertDepressed   Type of Therapy: Group Therapy, OT   Treatment Goals addressed: Coping   Interventions: Psychosocial skills training, Supportive   Summary: Occupational Therapy group   Therapist Response: Patient engaged in group when prompted.        Session Time: 12:00 -1:00   Participation Level: Active   Behavioral Response: CasualAlertDepressed   Type of Therapy: Group therapy   Treatment Goals addressed: Coping   Interventions: CBT; Solution focused; Supportive; Reframing   Summary: 12:00 - 12:50: Cln continued topic of boundaries and introduced how to set and maintain healthy boundaries. Cln utilized handout "how to set boundaries" and group members worked through examples to practice setting appropriate boundaries.  12:50 -1:00 Clinician led check-out. Clinician assessed for immediate needs, medication compliance and efficacy, and safety concerns   Therapist Response: 12:00 - 12:50: Pt engaged in discussion and reports struggle with maintaining boundaries.  12:50 - 1:00: At check-out, patient rates his mood at a 7 on a  scale of 1-10 with 10 being great. Pt reports afternoon plans of seeing a friend. Pt demonstrates some progress as evidenced by engaging in social outlets. Patient denies SI/HI at  the end of group.    Suicidal/Homicidal: Nowithout intent/plan  Plan: Pt will continue in PHP while working to decrease depression and anxiety, increase ability to manage symptoms in a healthy manner.   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 Severe major depression without psychotic features (HCC)   2. GAD (generalized anxiety disorder)       Donia Guiles, LCSW 07/27/2021

## 2021-07-27 NOTE — Psych (Signed)
Virtual Visit via Video Note  I connected with Scott Gordon on 06/11/21 at  9:00 AM EDT by a video enabled telemedicine application and verified that I am speaking with the correct person using two identifiers.  Location: Patient: patient home Provider: clinical home office   I discussed the limitations of evaluation and management by telemedicine and the availability of in person appointments. The patient expressed understanding and agreed to proceed.  I discussed the assessment and treatment plan with the patient. The patient was provided an opportunity to ask questions and all were answered. The patient agreed with the plan and demonstrated an understanding of the instructions.   The patient was advised to call back or seek an in-person evaluation if the symptoms worsen or if the condition fails to improve as anticipated.  Pt was provided 60 minutes of non-face-to-face time during this encounter.   Scott Guiles, LCSW    [facility] PHP THERAPIST PROGRESS NOTE  Scott Gordon [id number]  Session Time: 9:00 - 10:00  Participation Level: Active  Behavioral Response: CasualAlertDepressed  Type of Therapy: Group Therapy  Treatment Goals addressed: Coping  Interventions: CBT, DBT, Supportive, and Reframing  Summary: Clinician led check-in regarding current stressors and situation. Clinician utilized active listening and empathetic response and validated patient emotions. Clinician facilitated processing group on pertinent issues.  Therapist Response: Scott Gordon is a 30 y.o. male who presents with depression symptoms. Patient arrived within time allowed and reports that he is feeling "tired." Patient rates his mood at a 4.5 on a scale of 1-10 with 10 being great. Pt reports he got bad news about a potential job yesterday and he felt upset. Pt reports trying to distract himself with playing guitar. Pt states he slept poorly. Pt able to process. Pt engaged in  discussion.  **Pt chose to leave group at 10 stating he was "tired" and wanted to rest. Pt denies SI/HI before leaving group.       Suicidal/Homicidal: Nowithout intent/plan  Plan: Pt will continue in PHP while working to decrease depression and anxiety, increase ability to manage symptoms in a healthy manner.   Diagnosis: Moderate episode of recurrent major depressive disorder (HCC) [F33.1]    1. Moderate episode of recurrent major depressive disorder (HCC)   2. GAD (generalized anxiety disorder)       Scott Guiles, LCSW 07/27/2021

## 2021-08-09 ENCOUNTER — Telehealth (HOSPITAL_COMMUNITY): Payer: No Payment, Other | Admitting: Psychiatry

## 2021-08-16 ENCOUNTER — Ambulatory Visit (HOSPITAL_COMMUNITY): Payer: No Payment, Other | Admitting: Licensed Clinical Social Worker

## 2021-09-28 ENCOUNTER — Encounter (HOSPITAL_COMMUNITY): Payer: Self-pay | Admitting: Psychiatry

## 2021-09-28 ENCOUNTER — Other Ambulatory Visit (HOSPITAL_COMMUNITY)
Admission: EM | Admit: 2021-09-28 | Discharge: 2021-09-30 | Disposition: A | Discharge status: Home / Self Care | Payer: No Payment, Other | Attending: Psychiatry | Admitting: Psychiatry

## 2021-09-28 DIAGNOSIS — F332 Major depressive disorder, recurrent severe without psychotic features: Secondary | ICD-10-CM

## 2021-09-28 DIAGNOSIS — Z20822 Contact with and (suspected) exposure to covid-19: Secondary | ICD-10-CM | POA: Insufficient documentation

## 2021-09-28 DIAGNOSIS — R45851 Suicidal ideations: Secondary | ICD-10-CM | POA: Insufficient documentation

## 2021-09-28 DIAGNOSIS — Z813 Family history of other psychoactive substance abuse and dependence: Secondary | ICD-10-CM | POA: Insufficient documentation

## 2021-09-28 DIAGNOSIS — F1090 Alcohol use, unspecified, uncomplicated: Secondary | ICD-10-CM | POA: Insufficient documentation

## 2021-09-28 DIAGNOSIS — F419 Anxiety disorder, unspecified: Secondary | ICD-10-CM | POA: Insufficient documentation

## 2021-09-28 DIAGNOSIS — F3181 Bipolar II disorder: Secondary | ICD-10-CM | POA: Diagnosis not present

## 2021-09-28 DIAGNOSIS — F17210 Nicotine dependence, cigarettes, uncomplicated: Secondary | ICD-10-CM | POA: Insufficient documentation

## 2021-09-28 DIAGNOSIS — Z811 Family history of alcohol abuse and dependence: Secondary | ICD-10-CM | POA: Insufficient documentation

## 2021-09-28 DIAGNOSIS — Z6372 Alcoholism and drug addiction in family: Secondary | ICD-10-CM | POA: Insufficient documentation

## 2021-09-28 DIAGNOSIS — Z79899 Other long term (current) drug therapy: Secondary | ICD-10-CM | POA: Insufficient documentation

## 2021-09-28 DIAGNOSIS — F1220 Cannabis dependence, uncomplicated: Secondary | ICD-10-CM

## 2021-09-28 DIAGNOSIS — F12988 Cannabis use, unspecified with other cannabis-induced disorder: Secondary | ICD-10-CM

## 2021-09-28 DIAGNOSIS — F39 Unspecified mood [affective] disorder: Secondary | ICD-10-CM

## 2021-09-28 DIAGNOSIS — Z62810 Personal history of physical and sexual abuse in childhood: Secondary | ICD-10-CM | POA: Insufficient documentation

## 2021-09-28 DIAGNOSIS — F1924 Other psychoactive substance dependence with psychoactive substance-induced mood disorder: Secondary | ICD-10-CM | POA: Insufficient documentation

## 2021-09-28 LAB — COMPREHENSIVE METABOLIC PANEL
ALT: 22 U/L (ref 0–44)
AST: 22 U/L (ref 15–41)
Albumin: 4 g/dL (ref 3.5–5.0)
Alkaline Phosphatase: 49 U/L (ref 38–126)
Anion gap: 6 (ref 5–15)
BUN: 7 mg/dL (ref 6–20)
CO2: 28 mmol/L (ref 22–32)
Calcium: 9.4 mg/dL (ref 8.9–10.3)
Chloride: 106 mmol/L (ref 98–111)
Creatinine, Ser: 0.86 mg/dL (ref 0.61–1.24)
GFR, Estimated: >60 mL/min (ref >60)
Glucose, Bld: 89 mg/dL (ref 70–99)
Potassium: 3.9 mmol/L (ref 3.5–5.1)
Sodium: 140 mmol/L (ref 135–145)
Total Bilirubin: 0.4 mg/dL (ref 0.3–1.2)
Total Protein: 6.9 g/dL (ref 6.5–8.1)

## 2021-09-28 LAB — CBC WITH DIFFERENTIAL/PLATELET
Abs Immature Granulocytes: 0.01 10*3/uL (ref 0.00–0.07)
Basophils Absolute: 0 10*3/uL (ref 0.0–0.1)
Basophils Relative: 0 %
Eosinophils Absolute: 0 10*3/uL (ref 0.0–0.5)
Eosinophils Relative: 0 %
HCT: 46 % (ref 39.0–52.0)
Hemoglobin: 15.2 g/dL (ref 13.0–17.0)
Immature Granulocytes: 0 %
Lymphocytes Relative: 19 %
Lymphs Abs: 1.2 10*3/uL (ref 0.7–4.0)
MCH: 28.7 pg (ref 26.0–34.0)
MCHC: 33 g/dL (ref 30.0–36.0)
MCV: 86.8 fL (ref 80.0–100.0)
Monocytes Absolute: 0.4 10*3/uL (ref 0.1–1.0)
Monocytes Relative: 6 %
Neutro Abs: 5 10*3/uL (ref 1.7–7.7)
Neutrophils Relative %: 75 %
Platelets: 207 10*3/uL (ref 150–400)
RBC: 5.3 MIL/uL (ref 4.22–5.81)
RDW: 13.6 % (ref 11.5–15.5)
WBC: 6.7 10*3/uL (ref 4.0–10.5)
nRBC: 0 % (ref 0.0–0.2)

## 2021-09-28 LAB — URINALYSIS, ROUTINE W REFLEX MICROSCOPIC
Bilirubin Urine: NEGATIVE
Glucose, UA: NEGATIVE mg/dL
Hgb urine dipstick: NEGATIVE
Ketones, ur: NEGATIVE mg/dL
Leukocytes,Ua: NEGATIVE
Nitrite: NEGATIVE
Protein, ur: NEGATIVE mg/dL
Specific Gravity, Urine: 1.015 (ref 1.005–1.030)
pH: 6 (ref 5.0–8.0)

## 2021-09-28 LAB — RESP PANEL BY RT-PCR (FLU A&B, COVID) ARPGX2
Influenza A by PCR: NEGATIVE
Influenza B by PCR: NEGATIVE
SARS Coronavirus 2 by RT PCR: NEGATIVE

## 2021-09-28 LAB — POCT URINE DRUG SCREEN - MANUAL ENTRY (I-SCREEN)
POC Amphetamine UR: NOT DETECTED
POC Buprenorphine (BUP): NOT DETECTED
POC Cocaine UR: NOT DETECTED
POC Marijuana UR: POSITIVE — AB
POC Methadone UR: NOT DETECTED
POC Methamphetamine UR: NOT DETECTED
POC Morphine: NOT DETECTED
POC Oxazepam (BZO): NOT DETECTED
POC Oxycodone UR: NOT DETECTED
POC Secobarbital (BAR): NOT DETECTED

## 2021-09-28 LAB — LIPID PANEL
Cholesterol: 155 mg/dL (ref 0–200)
HDL: 60 mg/dL (ref >40)
LDL Cholesterol: 89 mg/dL (ref 0–99)
Total CHOL/HDL Ratio: 2.6 RATIO
Triglycerides: 30 mg/dL (ref <150)
VLDL: 6 mg/dL (ref 0–40)

## 2021-09-28 LAB — POC SARS CORONAVIRUS 2 AG: SARSCOV2ONAVIRUS 2 AG: NEGATIVE

## 2021-09-28 LAB — HEMOGLOBIN A1C
Hgb A1c MFr Bld: 5.4 % (ref 4.8–5.6)
Mean Plasma Glucose: 108 mg/dL

## 2021-09-28 LAB — ETHANOL: Alcohol, Ethyl (B): <10 mg/dL (ref <10)

## 2021-09-28 LAB — TSH: TSH: 0.7 u[IU]/mL (ref 0.350–4.500)

## 2021-09-28 MED ORDER — DULOXETINE HCL 30 MG PO CPEP
30.0000 mg | ORAL_CAPSULE | Freq: Every day | ORAL | Status: DC
  Administered 2021-09-28 – 2021-09-30 (×3): 30 mg via ORAL
  Filled 2021-09-28: qty 1
  Filled 2021-09-28: qty 7
  Filled 2021-09-28 (×2): qty 1

## 2021-09-28 MED ORDER — TRAZODONE HCL 50 MG PO TABS
50.0000 mg | ORAL_TABLET | Freq: Every evening | ORAL | Status: DC | PRN
  Administered 2021-09-29: 21:00:00 50 mg via ORAL
  Filled 2021-09-28: qty 7
  Filled 2021-09-28: qty 1

## 2021-09-28 MED ORDER — ACETAMINOPHEN 325 MG PO TABS: 650.0000 mg | ORAL_TABLET | Freq: Four times a day (QID) | ORAL | Status: DC | PRN

## 2021-09-28 MED ORDER — HYDROXYZINE HCL 25 MG PO TABS: 25.0000 mg | ORAL_TABLET | Freq: Three times a day (TID) | ORAL | Status: DC | PRN

## 2021-09-28 MED ORDER — MAGNESIUM HYDROXIDE 400 MG/5ML PO SUSP: 30.0000 mL | Freq: Every day | ORAL | Status: DC | PRN

## 2021-09-28 MED ORDER — ALUM & MAG HYDROXIDE-SIMETH 200-200-20 MG/5ML PO SUSP: 30.0000 mL | ORAL | Status: DC | PRN

## 2021-09-28 NOTE — Progress Notes (Signed)
Pt is admitted to [facility] due to passive SI. Pt verbally contracts for safety on the unit. Pt will transfer to [facility] pending negative PCR. Pt is alert and oriented with blunted affect. Pt is ambulatory and is oriented to staff/unit. Pt was cooperative with labs, skin assessment and admission process. Pt denies pain and current HI/AVH. Meal given. Staff will monitor for pt's safety.

## 2021-09-28 NOTE — ED Notes (Signed)
Pt eating dinner in no acute distress. Safety maintained. 

## 2021-09-28 NOTE — ED Notes (Signed)
Patient denying SI, HI or AVH at this particular time of assessment. Says he feels "safe here." Patient taking shower at this time.  Will continue to monitor for safety

## 2021-09-28 NOTE — ED Notes (Signed)
Received report from [place], RN, on pt's acceptance to [facility].

## 2021-09-28 NOTE — Progress Notes (Signed)
Pt is transferred to [facility] at this time. No concerns voiced. Pt's safety is maintained.

## 2021-09-28 NOTE — ED Notes (Signed)
31 yo male admitted to [facility] endorsing passive SI. Pt states, "I've been dealing with depression for years now and everything has just gotten overwhelming, financially and personally in my life". Denies HI/AVH. Support given. Reviewed positive coping skills printed out in pt's admission folder. Pt appreciative. Received a book to read.  Flat affect. Oriented to unit and unit rules. Informed pt to notify staff with any thoughts or urges to harm self prior to acting on them. Will monitor for safety. Pt verbally contracts for safety on unit.

## 2021-09-28 NOTE — ED Notes (Signed)
Patient declined to attended AA and wrap up group even after encouragement from staff.
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
## 2021-09-29 DIAGNOSIS — F1220 Cannabis dependence, uncomplicated: Secondary | ICD-10-CM | POA: Diagnosis not present

## 2021-09-29 DIAGNOSIS — Z20822 Contact with and (suspected) exposure to covid-19: Secondary | ICD-10-CM | POA: Diagnosis not present

## 2021-09-29 DIAGNOSIS — R45851 Suicidal ideations: Secondary | ICD-10-CM | POA: Diagnosis not present

## 2021-09-29 DIAGNOSIS — F3181 Bipolar II disorder: Secondary | ICD-10-CM | POA: Diagnosis not present

## 2021-09-29 NOTE — ED Notes (Signed)
Patient resting with no sxs of distress.  Will continue to monitor for safety 

## 2021-09-29 NOTE — ED Notes (Signed)
Patient resting with no sxs of distress noted - will continue to monitor for safety 

## 2021-09-29 NOTE — ED Provider Notes (Signed)
Behavioral Health Progress Note  Date and Time: 09/29/2021 1:50 PM Name: Scott Gordon H Buescher MRN:  [id number]  Subjective:   31 year old male with history of depression, anxiety, bipolar 2 disorder, substance use who presented to the [facility] voluntarily with suicidal thoughts.  On interview this afternoon patient states that [facility]. [facility] and was started on cymbalta.      Patient seen and chart reviewed-has been medication compliant and been appropriate with peers and staff on the unit.  Patient interviewed this morning.  He describes his mood as "a little bit better".  He rates his mood as of 4/10 (10 being the best).  He reports ongoing passive SI without plan or intent.  He denies HI/AVH.  Patient discusses recent stressors in detail including financial issues, his living situation and conflict at work.  Patient reports tolerating Cymbalta well without any side effects or adverse events.  Discussed possibly increasing this medication in a couple days if he continues to tolerate well-patient verbalized understanding.  Patient reports that he has went to groups and has found them to be "informative".  In discussing plans after discharge, patient indicates that [facility]; however, it is remote and he had no privacy in his and did not feel comfortable doing it.  Patient states that he is unsure if he can make PHP work with his work schedule.  Patient states that he would like to see a counselor weekly and have a provider for medication management once discharged.  Patient denies all physical complaints.  He reports sleeping okay and denies issues with appetite.  Patient states that he had several nighttime awakenings and felt groggy this morning.  Discussed with patient that he has as needed trazodone ordered but that he will need to ask for it if he has difficulties with  insomnia-patient verbalized understanding.  Encouraged patient to attend groups. Patient was given the opportunity to ask questions and  All questions answered. Patient verbalized understanding regarding plan of care.      Diagnosis:  Final diagnoses:  Severe episode of recurrent major depressive disorder, without psychotic features (HCC)  Cannabis use disorder, severe, in controlled environment (HCC)  Cannabis-induced mood disorder (HCC)    Total Time spent with patient: 15 minutes  Past Psychiatric History: MDD, anxiety Past Medical History:  Past Medical History:  Diagnosis Date   Bipolar 2 disorder (HCC)     Past Surgical History:  Procedure Laterality Date   WISDOM TOOTH EXTRACTION     Family History:  Family History  Family history unknown: Yes   Family Psychiatric  History:  "No one has been diagnosed".   States that his aunt struggled with alcoholism and that he has an uncle who is a recovered drug addict.  He reports that his uncle completed suicide Social History:  Social History   Substance and Sexual Activity  Alcohol Use Not Currently     Social History   Substance and Sexual Activity  Drug Use Yes   Types: Marijuana    Social History   Socioeconomic History   Marital status: Significant Other    Spouse name: Not on file   Number of children: Not on file   Years of education: Not on file   Highest education level: Not on file  Occupational History   Not on file  Tobacco Use   Smoking status: Never  Smokeless tobacco: Never  Vaping Use   Vaping Use: Every day   Substances: Nicotine, Flavoring  Substance and Sexual Activity   Alcohol use: Not Currently   Drug use: Yes    Types: Marijuana   Sexual activity: Yes    Birth control/protection: Condom  Other Topics Concern   Not on file  Social History Narrative   Not on file   Social Determinants of Health   Financial Resource Strain: Not on file  Food Insecurity: Not on file   Transportation Needs: Not on file  Physical Activity: Not on file  Stress: Not on file  Social Connections: Not on file   SDOH:  SDOH Screenings   Alcohol Screen: Not on file  Depression (PHQ2-9): Medium Risk   PHQ-2 Score: 11  Financial Resource Strain: Not on file  Food Insecurity: Not on file  Housing: Not on file  Physical Activity: Not on file  Social Connections: Not on file  Stress: Not on file  Tobacco Use: Low Risk    Smoking Tobacco Use: Never   Smokeless Tobacco Use: Never   Passive Exposure: Not on file  Transportation Needs: Not on file   Additional Social History:    Pain Medications: SEE MAR Prescriptions: SEE MAR Over the Counter: SEE MAR History of alcohol / drug use?: Yes Name of Substance 1: cannabis products (Delta 8) 1 - Age of First Use: 20 1 - Amount (size/oz): varies 1 - Frequency: daily 1 - Duration: ongoing 1 - Last Use / Amount: yesterday 1 - Method of Aquiring: purchase 1- Route of Use: vape pen                  Sleep: Good  Appetite:  Good  Current Medications:  Current Facility-Administered Medications  Medication Dose Route Frequency Provider Last Rate Last Admin   acetaminophen (TYLENOL) tablet 650 mg  650 mg Oral Q6H PRN Estella Husk, MD       alum & mag hydroxide-simeth (MAALOX/MYLANTA) 200-200-20 MG/5ML suspension 30 mL  30 mL Oral Q4H PRN Estella Husk, MD       DULoxetine (CYMBALTA) DR capsule 30 mg  30 mg Oral Daily Estella Husk, MD   30 mg at 09/29/21 3007   hydrOXYzine (ATARAX) tablet 25 mg  25 mg Oral TID PRN Estella Husk, MD       magnesium hydroxide (MILK OF MAGNESIA) suspension 30 mL  30 mL Oral Daily PRN Estella Husk, MD       traZODone (DESYREL) tablet 50 mg  50 mg Oral QHS PRN Estella Husk, MD       Current Outpatient Medications  Medication Sig Dispense Refill   nicotine (NICODERM CQ) 21 mg/24hr patch Place 1 patch (21 mg total) onto the skin daily. 30 patch 3    atomoxetine (STRATTERA) 40 MG capsule Take 1 capsule (40 mg total) by mouth daily. (Patient not taking: Reported on 09/28/2021) 30 capsule 3   mirtazapine (REMERON) 7.5 MG tablet Take 1 tablet (7.5 mg total) by mouth at bedtime. (Patient not taking: Reported on 09/28/2021) 30 tablet 0   QUEtiapine (SEROQUEL) 50 MG tablet Take 1 tablet (50 mg total) by mouth at bedtime. (Patient not taking: Reported on 09/28/2021) 60 tablet 3    Labs  Lab Results:  Admission on 09/28/2021  Component Date Value Ref Range Status   SARS Coronavirus 2 by RT PCR 09/28/2021 NEGATIVE  NEGATIVE Final   Comment: (NOTE) SARS-CoV-2 target nucleic acids are  NOT DETECTED.  The SARS-CoV-2 RNA is generally detectable in upper respiratory specimens during the acute phase of infection. The lowest concentration of SARS-CoV-2 viral copies this assay can detect is 138 copies/mL. A negative result does not preclude SARS-Cov-2 infection and should not be used as the sole basis for treatment or other patient management decisions. A negative result may occur with  improper specimen collection/handling, submission of specimen other than nasopharyngeal swab, presence of viral mutation(s) within the areas targeted by this assay, and inadequate number of viral copies(<138 copies/mL). A negative result must be combined with clinical observations, patient history, and epidemiological information. The expected result is Negative.  Fact Sheet for Patients:  BloggerCourse.com  Fact Sheet for Healthcare Providers:  SeriousBroker.it  This test is no                          t yet approved or cleared by the [place] FDA and  has been authorized for detection and/or diagnosis of SARS-CoV-2 by FDA under an Emergency Use Authorization (EUA). This EUA will remain  in effect (meaning this test can be used) for the duration of the COVID-19 declaration under Section 564(b)(1) of the  Act, 21 U.S.C.section 360bbb-3(b)(1), unless the authorization is terminated  or revoked sooner.       Influenza A by PCR 09/28/2021 NEGATIVE  NEGATIVE Final   Influenza B by PCR 09/28/2021 NEGATIVE  NEGATIVE Final   Comment: (NOTE) The Xpert Xpress SARS-CoV-2/FLU/RSV plus assay is intended as an aid in the diagnosis of influenza from Nasopharyngeal swab specimens and should not be used as a sole basis for treatment. Nasal washings and aspirates are unacceptable for Xpert Xpress SARS-CoV-2/FLU/RSV testing.  Fact Sheet for Patients: BloggerCourse.com  Fact Sheet for Healthcare Providers: SeriousBroker.it  This test is not yet approved or cleared by the [place] FDA and has been authorized for detection and/or diagnosis of SARS-CoV-2 by FDA under an Emergency Use Authorization (EUA). This EUA will remain in effect (meaning this test can be used) for the duration of the COVID-19 declaration under Section 564(b)(1) of the Act, 21 U.S.C. section 360bbb-3(b)(1), unless the authorization is terminated or revoked.  Performed at [facility], [address] [place], [place] [address]    WBC 09/28/2021 6.7  4.0 - 10.5 K/uL Final   RBC 09/28/2021 5.30  4.22 - 5.81 MIL/uL Final   Hemoglobin 09/28/2021 15.2  13.0 - 17.0 g/dL Final   HCT 19/14/7829 46.0  39.0 - 52.0 % Final   MCV 09/28/2021 86.8  80.0 - 100.0 fL Final   MCH 09/28/2021 28.7  26.0 - 34.0 pg Final   MCHC 09/28/2021 33.0  30.0 - 36.0 g/dL Final   RDW 56/21/3086 13.6  11.5 - 15.5 % Final   Platelets 09/28/2021 207  150 - 400 K/uL Final   nRBC 09/28/2021 0.0  0.0 - 0.2 % Final   Neutrophils Relative % 09/28/2021 75  % Final   Neutro Abs 09/28/2021 5.0  1.7 - 7.7 K/uL Final   Lymphocytes Relative 09/28/2021 19  % Final   Lymphs Abs 09/28/2021 1.2  0.7 - 4.0 K/uL Final   Monocytes Relative 09/28/2021 6  % Final   Monocytes Absolute 09/28/2021 0.4  0.1 - 1.0 K/uL  Final   Eosinophils Relative 09/28/2021 0  % Final   Eosinophils Absolute 09/28/2021 0.0  0.0 - 0.5 K/uL Final   Basophils Relative 09/28/2021 0  % Final   Basophils  Absolute 09/28/2021 0.0  0.0 - 0.1 K/uL Final   Immature Granulocytes 09/28/2021 0  % Final   Abs Immature Granulocytes 09/28/2021 0.01  0.00 - 0.07 K/uL Final   Performed at [facility], [address] [place], [place] [address]   Sodium 09/28/2021 140  135 - 145 mmol/L Final   Potassium 09/28/2021 3.9  3.5 - 5.1 mmol/L Final   Chloride 09/28/2021 106  98 - 111 mmol/L Final   CO2 09/28/2021 28  22 - 32 mmol/L Final   Glucose, Bld 09/28/2021 89  70 - 99 mg/dL Final   Glucose reference range applies only to samples taken after fasting for at least 8 hours.   BUN 09/28/2021 7  6 - 20 mg/dL Final   Creatinine, Ser 09/28/2021 0.86  0.61 - 1.24 mg/dL Final   Calcium 40/98/119101/24/2023 9.4  8.9 - 10.3 mg/dL Final   Total Protein 47/82/956201/24/2023 6.9  6.5 - 8.1 g/dL Final   Albumin 13/08/657801/24/2023 4.0  3.5 - 5.0 g/dL Final   AST 46/96/295201/24/2023 22  15 - 41 U/L Final   ALT 09/28/2021 22  0 - 44 U/L Final   Alkaline Phosphatase 09/28/2021 49  38 - 126 U/L Final   Total Bilirubin 09/28/2021 0.4  0.3 - 1.2 mg/dL Final   GFR, Estimated 09/28/2021 >60  >60 mL/min Final   Comment: (NOTE) Calculated using the CKD-EPI Creatinine Equation (2021)    Anion gap 09/28/2021 6  5 - 15 Final   Performed at [facility], [address] [place], [place] [address]   Hgb A1c MFr Bld 09/28/2021 5.4  4.8 - 5.6 % Final   Comment: (NOTE)         Prediabetes: 5.7 - 6.4         Diabetes: >6.4         Glycemic control for adults with diabetes: <7.0    Mean Plasma Glucose 09/28/2021 108  mg/dL Final   Comment: (NOTE) Performed At: [facility] [address] [place] [address] [place], [place] [id number] Jolene SchimkeNagendra Sanjai MD DG:[id number]:[phone]    Alcohol, Ethyl (B) 09/28/2021 <10  <10 mg/dL Final   Comment: (NOTE) Lowest detectable limit for serum alcohol is 10  mg/dL.  For medical purposes only. Performed at [facility], [address] [place], [place] [address]    Cholesterol 09/28/2021 155  0 - 200 mg/dL Final   Triglycerides 75/64/332901/24/2023 30  <150 mg/dL Final   HDL 51/88/416601/24/2023 60  >40 mg/dL Final   Total CHOL/HDL Ratio 09/28/2021 2.6  RATIO Final   VLDL 09/28/2021 6  0 - 40 mg/dL Final   LDL Cholesterol 09/28/2021 89  0 - 99 mg/dL Final   Comment:        Total Cholesterol/HDL:CHD Risk Coronary Heart Disease Risk Table                     Men   Women  1/2 Average Risk   3.4   3.3  Average Risk       5.0   4.4  2 X Average Risk   9.6   7.1  3 X Average Risk  23.4   11.0        Use the calculated Patient Ratio above and the CHD Risk Table to determine the patient's CHD Risk.        ATP III CLASSIFICATION (LDL):  <100     mg/dL   Optimal  [id number]  mg/dL   Near  or Above                    Optimal  130-159  mg/dL   Borderline  161-096  mg/dL   High  >045     mg/dL   Very High Performed at [facility], [address], [place], [place] [address]    TSH 09/28/2021 0.700  0.350 - 4.500 uIU/mL Final   Comment: Performed by a 3rd Generation assay with a functional sensitivity of <=0.01 uIU/mL. Performed at [facility], [address], [place], [place] [address]    POC Amphetamine UR 09/28/2021 None Detected  NONE DETECTED (Cut Off Level 1000 ng/mL) Final   POC Secobarbital (BAR) 09/28/2021 None Detected  NONE DETECTED (Cut Off Level 300 ng/mL) Final   POC Buprenorphine (BUP) 09/28/2021 None Detected  NONE DETECTED (Cut Off Level 10 ng/mL) Final   POC Oxazepam (BZO) 09/28/2021 None Detected  NONE DETECTED (Cut Off Level 300 ng/mL) Final   POC Cocaine UR 09/28/2021 None Detected  NONE DETECTED (Cut Off Level 300 ng/mL) Final   POC Methamphetamine UR 09/28/2021 None Detected  NONE DETECTED (Cut Off Level 1000 ng/mL) Final   POC Morphine 09/28/2021 None Detected  NONE DETECTED (Cut Off Level 300 ng/mL) Final   POC Oxycodone  UR 09/28/2021 None Detected  NONE DETECTED (Cut Off Level 100 ng/mL) Final   POC Methadone UR 09/28/2021 None Detected  NONE DETECTED (Cut Off Level 300 ng/mL) Final   POC Marijuana UR 09/28/2021 Positive (A)  NONE DETECTED (Cut Off Level 50 ng/mL) Final   Color, Urine 09/28/2021 YELLOW  YELLOW Final   APPearance 09/28/2021 CLEAR  CLEAR Final   Specific Gravity, Urine 09/28/2021 1.015  1.005 - 1.030 Final   pH 09/28/2021 6.0  5.0 - 8.0 Final   Glucose, UA 09/28/2021 NEGATIVE  NEGATIVE mg/dL Final   Hgb urine dipstick 09/28/2021 NEGATIVE  NEGATIVE Final   Bilirubin Urine 09/28/2021 NEGATIVE  NEGATIVE Final   Ketones, ur 09/28/2021 NEGATIVE  NEGATIVE mg/dL Final   Protein, ur 82/95/6213 NEGATIVE  NEGATIVE mg/dL Final   Nitrite 08/65/7846 NEGATIVE  NEGATIVE Final   Leukocytes,Ua 09/28/2021 NEGATIVE  NEGATIVE Final   Performed at [facility], [address], [place], [place] [address]   SARSCOV2ONAVIRUS 2 AG 09/28/2021 NEGATIVE  NEGATIVE Final   Comment: (NOTE) SARS-CoV-2 antigen NOT DETECTED.   Negative results are presumptive.  Negative results do not preclude SARS-CoV-2 infection and should not be used as the sole basis for treatment or other patient management decisions, including infection  control decisions, particularly in the presence of clinical signs and  symptoms consistent with COVID-19, or in those who have been in contact with the virus.  Negative results must be combined with clinical observations, patient history, and epidemiological information. The expected result is Negative.  Fact Sheet for Patients: https://www.jennings-kim.com/  Fact Sheet for Healthcare Providers: https://Bijan-rogers.biz/  This test is not yet approved or cleared by the [place] FDA and  has been authorized for detection and/or diagnosis of SARS-CoV-2 by FDA under an Emergency Use Authorization (EUA).  This EUA will remain in effect (meaning this test can  be used) for the duration of  the COV                          ID-19 declaration under Section 564(b)(1) of the Act, 21 U.S.C. section 360bbb-3(b)(1), unless the authorization is terminated or revoked sooner.  Blood Alcohol level:  Lab Results  Component Value Date   ETH <10 09/28/2021    Metabolic Disorder Labs: Lab Results  Component Value Date   HGBA1C 5.4 09/28/2021   MPG 108 09/28/2021   No results found for: PROLACTIN Lab Results  Component Value Date   CHOL 155 09/28/2021   TRIG 30 09/28/2021   HDL 60 09/28/2021   CHOLHDL 2.6 09/28/2021   VLDL 6 09/28/2021   LDLCALC 89 09/28/2021    Therapeutic Lab Levels: No results found for: LITHIUM No results found for: VALPROATE No components found for:  CBMZ  Physical Findings   AUDIT    Flowsheet Row Counselor from 06/03/2020 in [facility]  Alcohol Use Disorder Identification Test Final Score (AUDIT) 4      GAD-7    Flowsheet Row Video Visit from 05/03/2021 in [facility] Office Visit from 03/23/2021 in [facility] Video Visit from 02/05/2021 in [facility] Office Visit from 07/02/2020 in [facility] Counselor from 06/03/2020 in [facility]  Total GAD-7 Score 15 20 15 14 16       PHQ2-9    Flowsheet Row ED from 09/28/2021 in [facility] Counselor from 06/08/2021 in [facility] Video Visit from 05/03/2021 in [facility] Office Visit from 03/23/2021 in [facility] Video Visit from 02/05/2021 in [facility]  PHQ-2 Total Score 4 5 5 6 6   PHQ-9 Total Score 11 18 22 27 24       Flowsheet Row ED from 09/28/2021 in [facility] Counselor from 06/08/2021 [facility] OP Visit from 05/25/2021 [facility]  C-SSRS RISK CATEGORY High Risk Low Risk No Risk        Musculoskeletal  Strength & Muscle Tone: within normal limits Gait & Station: normal Patient leans: N/A  Psychiatric Specialty Exam  Presentation  General Appearance: Appropriate for Environment; Casual  Eye Contact:Fair  Speech:Clear and Coherent; Normal Rate  Speech Volume:Normal  Handedness:Right   Mood and Affect  Mood:Depressed ("4/10")  Affect:Appropriate; Congruent; Depressed   Thought Process  Thought Processes:Coherent; Goal Directed; Linear  Descriptions of Associations:Intact  Orientation:Full (Time, Place and Person)  Thought Content:Logical; WDL  Diagnosis of Schizophrenia or Schizoaffective disorder in past: No    Hallucinations:Hallucinations: None  Ideas of Reference:None  Suicidal Thoughts:Suicidal Thoughts: Yes, Passive SI Active Intent and/or Plan: Without Intent; Without Plan SI Passive Intent and/or Plan: Without Intent; Without Plan  Homicidal Thoughts:Homicidal Thoughts: No   Sensorium  Memory:Immediate Good; Recent Good; Remote Good  Judgment:Good  Insight:Fair   Executive Functions  Concentration:Good  Attention Span:Good  Recall:Good  Fund of Knowledge:Good  Language:Good   Psychomotor Activity  Psychomotor Activity:Psychomotor Activity: Normal   Assets  Assets:Communication Skills; Desire for Improvement; Housing; Social Support; Physical Health   Sleep  Sleep:Sleep: Fair   Nutritional Assessment (For OBS and [facility] admissions only) Has the patient had a weight loss or gain of 10 pounds or more in the last 3 months?: Yes Has the patient had a decrease in food intake/or appetite?: Yes Does the patient have dental problems?: No Does the patient have eating habits or behaviors that may be indicators of an eating disorder including binging or inducing vomiting?: No Has the patient recently lost weight without  trying?: 3 Has the patient been  eating poorly because of a decreased appetite?: 1 Malnutrition Screening Tool Score: 4 Nutritional Assessment Referrals: Refer to Primary Care Provider    Physical Exam  Physical Exam Constitutional:      Appearance: Normal appearance. He is normal weight.  HENT:     Head: Normocephalic and atraumatic.  Eyes:     Extraocular Movements: Extraocular movements intact.  Pulmonary:     Effort: Pulmonary effort is normal.  Neurological:     General: No focal deficit present.     Mental Status: He is alert and oriented to person, place, and time.  Psychiatric:        Attention and Perception: Attention and perception normal.        Speech: Speech normal.        Behavior: Behavior normal. Behavior is cooperative.        Thought Content: Thought content normal.   Review of Systems  Constitutional:  Negative for chills and fever.  HENT:  Negative for hearing loss.   Eyes:  Negative for discharge and redness.  Respiratory:  Negative for cough.   Cardiovascular:  Negative for chest pain.  Gastrointestinal:  Negative for abdominal pain.  Musculoskeletal:  Negative for myalgias.  Neurological:  Negative for headaches.  Psychiatric/Behavioral:  Positive for depression and suicidal ideas.        +passive SI  Blood pressure 106/73, pulse 81, temperature (!) 97.1 F (36.2 C), temperature source Temporal, resp. rate 18, SpO2 100 %. There is no height or weight on file to calculate BMI.  Treatment Plan Summary: 31 year old male with history of depression, anxiety, bipolar 2 disorder, substance use who presented to the [facility] voluntarily with suicidal thoughts.  On interview this afternoon patient states that he [facility] for [facility] of suicidal thoughts.  Based on history, it does not appear that the patient has ever experienced a manic/hypomanic episode although does meet criteria for MDD.  Patient was started on cymbalta 30 mg for mood which he  has tolerated well.   Patient reports passive SI this morning although does report improvement in mood.  Patient unable to contract for safety outside the hospital setting.  Patient denies HI/AVH.  Patient remains appropriate for continued treatment at the [facility].   MDD R/o SIMD -continue cymbalta 30 mg daily (started 1/24); will titrate as deemed clinically appropriate/indicated   Cannabis use disorder -counseled on cessation   Dispo:ongoing. Pending improvement in symptomatology. SW to assist    Estella Husk, MD 09/29/2021 1:50 PM

## 2021-09-29 NOTE — Clinical Social Work Psych Note (Signed)
LCSW Initial Note   LCSW met with Scott Gordon for introduction and to begin discussions regarding treatment and potential discharge planning.   Scott Gordon reported feeling "okay" this morning. He did endorse passive SI, with no plan this morning, however he denied having any HI or AVH at this time. Scott Gordon shared that he has struggled with ongoing worsening depressive symptoms, including suicidal ideations for the last few weeks.   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 Scott Gordon reports there are 5 additional people living at his mother's home, which is a 3-bedroom home. He also shared that his family is not supportive of mental health, however he is currently in a polyamorous relationship and both his partners are supportive.   Scott Gordon could not identify a specific goal he wants to accomplish at this time. He shared "I really just came because I was scared of what may happen. I woke up and my suicidal thoughts were very intense. It was the first thing I thought about when I woke up".   LCSW explained that the attending provider would follow up with him today regarding concerns he may have about his current medications and any other forms of treatment. Scott Gordon expressed understanding.   Scott Gordon reports that he does not have any outpatient providers, however he is interested in establish services at discharge. At this time, Scott Gordon declined any IOP/PHP services due to having an unpredictable work schedule.  Scott Gordon plans to return to his mother's home at discharge, with outpatient medication management and therapy service resources.   LCSW will continue to follow for any additional questions, concerns or needs identified.    [name], MSW, LCSW Clinical Education officer, museum ([place]) [facility]

## 2021-09-29 NOTE — ED Notes (Signed)
Pt asleep in bed. Respirations even and unlabored. Will continue to monitor for safety. ?

## 2021-09-29 NOTE — Progress Notes (Signed)
Pt attended SW group therapy. Pt presently in his room. No concerns voiced. Monitoring for pt's safety.

## 2021-09-29 NOTE — Clinical Social Work Psych Note (Addendum)
Cognitive Distortions & Restructuring   Cognitive Distortions & Cognitive Restructuring (CBT & DBT Skills)  Date: 09/29/21  Type of Therapy/Therapeutic Modalities:  Participation Level: Active  Objective: The purpose of this group is to discuss and assist patients in identifying cognitive distortions (negative thinking patterns) that can influence their emotions and behaviors. Facilitators will guide conversations that discuss how these cognitive distortions contribute to common disorders such as anxiety and depression.   Therapeutic Goals:  Patient will identify negative thinking patterns they currently experience and how those thoughts influence their behaviors.  Patient will begin to explore the possible misinformation and/or traumatic experiences that influence their negative thought patterns.  Patient will explore the foundations and techniques of cognitive restructuring by reviewing CBT and DBT techniques.  Patient will discuss the   Summary of Patients Progress:  Scott Gordon was engaged and participated throughout the group's discussion. Scott Gordon was challenged on the therapeutic goals and objective listed above. Scott Gordon completed a worksheet practicing cognitive restructuring skills.

## 2021-09-29 NOTE — ED Notes (Signed)
Patient attended group with topic Personal Development, patient was active in group, answered questions when asked spoke about some of his goals, but did not go into detail. Was agreeable when discussing self sabotaging behaviors, Scott Gordon he sometimes do it without thinking, talked about reducing stress, relapse and anger management. We also discussed Substance dependence why using a little is no good for you. Patient was open and listened putting in comments here and there.

## 2021-09-29 NOTE — ED Notes (Signed)
Pt sitting on bed, A&O x4, calm and cooperative. Denies current SI/HI/AVH. Denies any current needs. No signs of acute distress noted. Will continue to monitor for safety.

## 2021-09-29 NOTE — Progress Notes (Signed)
Pt had dinner then went back to his room. No concerns voiced or distress noted. Pt's safety is maintained.

## 2021-09-29 NOTE — Progress Notes (Addendum)
Pt is awake, alert and oriented. Pt did not voice any complaints of pain or discomfort. No signs of acute distress noted. Administered scheduled med with no incident. Pt endorses SI with no plan. Pt verbally contracts for safety on the unit. Pt denies current HI/AVH. 15 minutes checks for pt's safety in effect. Staff will monitor for pt's safety.

## 2021-09-29 NOTE — ED Notes (Signed)
Patient declined to attend wrap up and AA group after encouragement from staff. °

## 2021-09-30 DIAGNOSIS — F3181 Bipolar II disorder: Secondary | ICD-10-CM | POA: Diagnosis not present

## 2021-09-30 DIAGNOSIS — R45851 Suicidal ideations: Secondary | ICD-10-CM | POA: Diagnosis not present

## 2021-09-30 DIAGNOSIS — F1220 Cannabis dependence, uncomplicated: Secondary | ICD-10-CM | POA: Diagnosis not present

## 2021-09-30 DIAGNOSIS — Z20822 Contact with and (suspected) exposure to covid-19: Secondary | ICD-10-CM | POA: Diagnosis not present

## 2021-09-30 MED ORDER — DULOXETINE HCL 30 MG PO CPEP: 30.0000 mg | ORAL_CAPSULE | Freq: Every day | ORAL | 0 refills | Status: DC

## 2021-09-30 MED ORDER — TRAZODONE HCL 50 MG PO TABS: 50.0000 mg | ORAL_TABLET | Freq: Every evening | ORAL | 0 refills | Status: DC | PRN

## 2021-09-30 NOTE — ED Notes (Signed)
Pt eating breakfast 

## 2021-09-30 NOTE — Discharge Instructions (Signed)
Take all medications as prescribed by his/her mental healthcare provider. °Report any adverse effects and or reactions from the medicines to your outpatient provider promptly. °Do not engage in alcohol and or illegal drug use while on prescription medicines. °In the event of worsening symptoms, call the crisis hotline, 911 and or go to the nearest ED for appropriate evaluation and treatment of symptoms. °follow-up with your primary care provider for your other medical issues, concerns and or health care needs. ° ° ° ° °Please come to [facility] (this facility) during walk in hours for appointment with psychiatrist for further medication management and for therapists for therapy.  ° ° Walk in hours are 8-11 AM Monday through Thursday for medication management. Therapy walk in hours are Monday-Wednesday 8 AM-1PM.   It is first come, first -serve; it is best to arrive by 7:00 AM.  ° °On Friday from 1 pm to 4 pm for therapy intake only. Please arrive by 12:00 pm as it is  first come, first -serve.   ° °When you arrive please go upstairs for your appointment. If you are unsure of where to go, inform the front desk that you are here for a walk in appointment and they will assist you with directions upstairs. ° °Address:  °[address], in [place], [address] °Ph: [phone]  ° °

## 2021-09-30 NOTE — Group Note (Signed)
Group Topic: Relaxation  Group Date: 09/30/2021 Start Time: 1000 End Time: 1120 Facilitators: Loma Newton  Department: [facility]  Number of Participants: 4  Group Focus: relaxation, check-in Treatment Modality:  Patient-Centered Therapy Interventions utilized were assignment, clarification, problem solving, and support Purpose: enhance coping skills, express feelings, improve communication skills, and reinforce self-care  Name: Scott Gordon Date of Birth: 1990-12-05  MR: [id number]    Level of Participation: active Quality of Participation: attentive and cooperative Interactions with others: gave feedback Mood/Affect: appropriate Triggers (if applicable): n/a Cognition: coherent/clear Progress: Minimal Response: Pt responded well to group today. Pt shared that he was feeling much better today and felt like he could discharge safely. Pt expressed that although he was feeling better he was fearful of leaving because he was unsure if he could maintain his stable state outside of this facility without proper support. Pt was comforted by this Clinical research associate and was given coping skills to help with his anxiety. Pt engaged in the deep breathing activity and reported feeling relaxed. Plan: referral / recommendations  Patients Problems:  Patient Active Problem List   Diagnosis Date Noted   MDD (major depressive disorder), recurrent episode, severe (HCC) 09/28/2021   Marijuana dependence (HCC) 05/03/2021   Attention deficit hyperactivity disorder (ADHD), 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   Substance induced mood disorder (HCC) 05/03/2021   Nicotine dependence with withdrawal 05/03/2021   Night sweats 03/24/2021   Loss of weight 03/24/2021   Leukocytosis 03/24/2021   BMI 20.0-20.9, adult 03/24/2021   Severe major depression without psychotic features (HCC) 02/05/2021   Moderate episode of recurrent major depressive disorder (HCC) 06/03/2020   GAD  (generalized anxiety disorder) 06/03/2020   Elevated CK 06/14/2018   Non-traumatic rhabdomyolysis    Abdominal pain

## 2021-09-30 NOTE — ED Notes (Signed)
Pt is alert and oriented x3  remained in bed for breakfast but got up and participated in group.  Pt has flat affect and depressed mood but does brighten up on engagement and has good eye contact.   Pt denies si, hi or avh when asked this morning.     He reports that he slept well and currently has no complaints.   Will continue to monitor for safety.

## 2021-09-30 NOTE — ED Notes (Signed)
Pt given AVS and two rx along with one week worth of medication as ordered.   Verbalized understanding of f/u appointments.    No distress noted at time of discharge.

## 2021-09-30 NOTE — ED Notes (Signed)
Pt eating lunch

## 2021-09-30 NOTE — ED Notes (Signed)
Pt asleep in bed. Respirations even and unlabored. Will continue to monitor for safety. ?

## 2021-09-30 NOTE — ED Notes (Signed)
Pt is currently resting in bed.  No distress noted.

## 2021-09-30 NOTE — ED Provider Notes (Signed)
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  Patient describes his mood is "not too bad".  He rates it a 5 or 6/10 (10 being the best).  Patient reports sleeping and eating okay.  Patient states that his anxiety "spiked a bit last night" which he attributes to no longer being in crisis and not feeling comfortable in all in a psychiatric facility.  Patient states that he feels ready to discharge and requests resources for one-on-one counseling upon discharge as well as psychiatric medication provider.  Patient denies SI/HI/AVH.  Patient states that he has learned many coping skills such as mindfulness and deep breathing since his time on the [facility] which he plans to utilize once he is discharged when he becomes overwhelmed.  Patient requests discharge and states that he has a friend who would be able to pick him up Patient was given the opportunity to ask questions and  All questions answered. Patient verbalized understanding regarding plan of care.    Stay Summary:  31 year old male with history of depression, anxiety, bipolar 2 disorder, substance use who presented to the [facility] voluntarily with suicidal thoughts on 1/24.  On interview this afternoon patient states that he presented to the [facility] for assessment of suicidal thoughts.  Based on history, it does not appear that the patient has ever experienced a manic/hypomanic episode although does meet criteria for MDD.  Patient was started on cymbalta 30 mg for mood on 1/24 which he tolerated well. By 1/26, patient had  reported improvement in mood and denied SI/HI/AVH and requested discharge. Patient did not meet criteria for IVC at the time of discharge ( not imminent risk to self, others or psychotic) and was discharged per request   Upon completion of this admission the Adonis Huguenin was both mentally and medically stable for discharge denying suicidal/homicidal ideation, symptoms that would be consistent with psychosis (AVH, IOR, paranoia, etc).   On my interview today, day of discharge, , patient is in NAD, alert, oriented, calm, cooperative, and attentive, with normal affect, speech, and behavior. Objectively, there is no evidence of psychosis/ mania (able to converse coherently, linear and goal directed thought, no RIS, no distractibility, not pre-occupied, no FOI, etc) nor depression to the point of suicidality (able to concentrate, affect full and reactive, speech normal r/v/t, no psychomotor retardation/agitation, etc).  Overall, patient appears to be at the point, in the absence of inhibiting or disinhibiting symptoms, where he can successfully move to lesser restrictive setting for care.   Total Time spent with patient: 20 minutes  Past Psychiatric History: depression, anxiety Past Medical History:  Past Medical History:  Diagnosis Date   Bipolar 2 disorder ([place])     Past Surgical History:  Procedure Laterality Date   WISDOM TOOTH EXTRACTION     Family History:  Family History  Family history unknown: Yes   Family Psychiatric History:  "No one has been diagnosed".   States that his aunt struggled with alcoholism and that he has an uncle who is a recovered drug addict.  He reports that his uncle completed suicide Social History:  Social History   Substance and Sexual Activity  Alcohol Use Not Currently     Social History   Substance and Sexual Activity  Drug Use Yes   Types: Marijuana    Social History   Socioeconomic History   Marital status: Significant Other     Spouse name: Not on file   Number of children: Not on file   Years of education: Not on file   Highest education level: Not on file  Occupational History   Not on file  Tobacco Use   Smoking status: Never   Smokeless tobacco: Never  Vaping Use   Vaping Use: Every day   Substances: Nicotine, Flavoring  Substance and Sexual Activity   Alcohol use: Not Currently   Drug use: Yes    Types: Marijuana   Sexual activity: Yes    Birth control/protection: Condom  Other Topics Concern   Not on file  Social History Narrative   Not on file   Social Determinants of Health   Financial Resource Strain: Not on file  Food Insecurity: Not on file  Transportation Needs: Not on file  Physical Activity: Not on file  Stress: Not on file  Social Connections: Not on file   SDOH:  SDOH Screenings   Alcohol Screen: Not on file  Depression (PHQ2-9): Medium Risk   PHQ-2 Score: 11  Financial Resource Strain: Not on file  Food Insecurity: Not on file  Housing: Not on file  Physical Activity: Not on file  Social Connections: Not on file  Stress: Not on file  Tobacco Use: Low Risk    Smoking Tobacco Use: Never   Smokeless Tobacco Use: Never   Passive Exposure: Not on file  Transportation Needs: Not on file    Tobacco Cessation:  Prescription not provided because: n/a  Current Medications:  Current Facility-Administered Medications  Medication Dose Route Frequency Provider Last Rate Last Admin   acetaminophen (TYLENOL) tablet 650 mg  650 mg Oral Q6H PRN Estella Husk, MD       alum & mag hydroxide-simeth (MAALOX/MYLANTA) 200-200-20 MG/5ML suspension 30 mL  30 mL Oral Q4H PRN Estella Husk, MD       DULoxetine (CYMBALTA) DR capsule 30 mg  30 mg Oral Daily Estella Husk, MD   30 mg at 09/30/21 1059   hydrOXYzine (ATARAX) tablet 25 mg  25 mg Oral TID PRN Estella Husk, MD       magnesium hydroxide (MILK OF MAGNESIA) suspension 30 mL  30 mL Oral Daily PRN Estella Husk, MD       traZODone (DESYREL) tablet 50 mg  50 mg Oral QHS PRN Estella Husk, MD   50 mg at 09/29/21 2114   Current Outpatient Medications  Medication Sig Dispense Refill   nicotine (NICODERM CQ) 21 mg/24hr patch Place 1 patch (21 mg total) onto the skin daily. 30 patch 3   atomoxetine (STRATTERA) 40 MG capsule Take 1 capsule (40 mg total) by mouth daily. (Patient not taking: Reported on 09/28/2021) 30 capsule 3   mirtazapine (REMERON) 7.5 MG tablet Take 1 tablet (7.5 mg total) by mouth at bedtime. (Patient not taking: Reported on 09/28/2021) 30 tablet 0   QUEtiapine (SEROQUEL) 50 MG tablet Take 1 tablet (50 mg total) by mouth at bedtime. (Patient not taking: Reported on 09/28/2021) 60 tablet 3    PTA Medications: (Not in a hospital admission)   Musculoskeletal  Strength & Muscle Tone: within normal limits Gait & Station: normal  Patient leans: N/A  Psychiatric Specialty Exam  Presentation  General Appearance: Appropriate for Environment; Casual  Eye Contact:Fair  Speech:Clear and Coherent; Normal Rate  Speech Volume:Normal  Handedness:Right   Mood and Affect  Mood:Depressed ("4/10")  Affect:Appropriate; Congruent; Depressed   Thought Process  Thought Processes:Coherent; Goal Directed; Linear  Descriptions of Associations:Intact  Orientation:Full (Time, Place and Person)  Thought Content:Logical; WDL  Diagnosis of Schizophrenia or Schizoaffective disorder in past: No    Hallucinations:Hallucinations: None  Ideas of Reference:None  Suicidal Thoughts:Suicidal Thoughts: Yes, Passive SI Passive Intent and/or Plan: Without Intent; Without Plan  Homicidal Thoughts:Homicidal Thoughts: No   Sensorium  Memory:Immediate Good; Recent Good; Remote Good  Judgment:Good  Insight:Fair   Executive Functions  Concentration:Good  Attention Span:Good  Recall:Good  Fund of Knowledge:Good  Language:Good   Psychomotor Activity  Psychomotor  Activity:Psychomotor Activity: Normal   Assets  Assets:Communication Skills; Desire for Improvement; Housing; Social Support; Physical Health   Sleep  Sleep:Sleep: Fair   No data recorded  Physical Exam  Physical Exam Constitutional:      Appearance: Normal appearance. He is normal weight.  HENT:     Head: Normocephalic and atraumatic.  Pulmonary:     Effort: Pulmonary effort is normal.  Neurological:     Mental Status: He is alert and oriented to person, place, and time.   Review of Systems  Constitutional:  Negative for chills and fever.  HENT:  Negative for hearing loss.   Eyes:  Negative for discharge and redness.  Respiratory:  Negative for cough.   Cardiovascular:  Negative for chest pain.  Gastrointestinal:  Negative for abdominal pain.  Musculoskeletal:  Negative for myalgias.  Neurological:  Negative for headaches.  Psychiatric/Behavioral:  Negative for depression and suicidal ideas.   Blood pressure 110/76, pulse 79, temperature (!) 97.5 F (36.4 C), temperature source Temporal, resp. rate 18, SpO2 100 %. There is no height or weight on file to calculate BMI.  Demographic Factors:  Male and Caucasian  Loss Factors: Financial problems/change in socioeconomic status  Historical Factors: Family history of suicide and Family history of mental illness or substance abuse  Risk Reduction Factors:   Sense of responsibility to family, Employed, Living with another person, especially a relative, Positive social support, and Positive coping skills or problem solving skills  Continued Clinical Symptoms:  Depression:   Recent sense of peace/wellbeing Alcohol/Substance Abuse/Dependencies  Cognitive Features That Contribute To Risk:  None    Suicide Risk:  Minimal: No identifiable suicidal ideation.  Patients presenting with no risk factors but with morbid ruminations; may be classified as minimal risk based on the severity of the depressive symptoms  Plan Of  Care/Follow-up recommendations:  Activity:  as toelrated Diet:  regualr Other:     Take all medications as prescribed by his/her mental healthcare provider. Report any adverse effects and or reactions from the medicines to your outpatient provider promptly. Do not engage in alcohol and or illegal drug use while on prescription medicines. In the event of worsening symptoms, call the crisis hotline, 911 and or go to the nearest ED for appropriate evaluation and treatment of symptoms. follow-up with your primary care provider for your other medical issues, concerns and or health care needs.   Allergies as of 09/30/2021       Reactions   Pineapple Hives        Medication List     STOP taking these medications    atomoxetine 40 MG capsule Commonly known as: Strattera  mirtazapine 7.5 MG tablet Commonly known as: REMERON   nicotine 21 mg/24hr patch Commonly known as: Nicoderm CQ   QUEtiapine 50 MG tablet Commonly known as: SEROQUEL       TAKE these medications    DULoxetine 30 MG capsule Commonly known as: CYMBALTA Take 1 capsule (30 mg total) by mouth daily. Start taking on: October 01, 2021   traZODone 50 MG tablet Commonly known as: DESYREL Take 1 tablet (50 mg total) by mouth at bedtime as needed for sleep.        Patient was provided with 7 samples of above medications as well as paper prescriptions for 30 days at time of discharge.  Patient was provided with follow up information regarding psychiatric outpatient resources in AVS with the assistance of SW prior to discharge.      Disposition: home/self care  Ival Bible, MD 09/30/2021, 12:21 PM

## 2021-10-14 ENCOUNTER — Telehealth (HOSPITAL_COMMUNITY): Payer: Self-pay | Admitting: Emergency Medicine

## 2021-10-14 NOTE — BH Assessment (Signed)
Care Management - [facility] Follow Up Discharges  ° °Writer attempted to make contact with patient today and was unsuccessful.  Writer left a HIPPA compliant voice message.  ° °Per chart review, patient was provided with outpatient resources. ° °

## 2022-04-06 ENCOUNTER — Encounter (HOSPITAL_COMMUNITY): Payer: Self-pay

## 2022-04-06 ENCOUNTER — Ambulatory Visit (INDEPENDENT_AMBULATORY_CARE_PROVIDER_SITE_OTHER): Payer: No Payment, Other | Admitting: Licensed Clinical Social Worker

## 2022-04-06 DIAGNOSIS — F331 Major depressive disorder, recurrent, moderate: Secondary | ICD-10-CM | POA: Diagnosis not present

## 2022-04-06 DIAGNOSIS — F411 Generalized anxiety disorder: Secondary | ICD-10-CM
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
## 2022-04-06 NOTE — Plan of Care (Signed)
Pt agreeable to plan  ?

## 2022-04-26 ENCOUNTER — Ambulatory Visit (INDEPENDENT_AMBULATORY_CARE_PROVIDER_SITE_OTHER): Payer: No Payment, Other | Admitting: Physician Assistant

## 2022-04-26 ENCOUNTER — Encounter (HOSPITAL_COMMUNITY): Payer: Self-pay | Admitting: Physician Assistant

## 2022-04-26 ENCOUNTER — Ambulatory Visit (INDEPENDENT_AMBULATORY_CARE_PROVIDER_SITE_OTHER): Payer: No Payment, Other | Admitting: Licensed Clinical Social Worker

## 2022-04-26 ENCOUNTER — Other Ambulatory Visit: Payer: Self-pay

## 2022-04-26 DIAGNOSIS — F332 Major depressive disorder, recurrent severe without psychotic features: Secondary | ICD-10-CM

## 2022-04-26 DIAGNOSIS — F411 Generalized anxiety disorder: Secondary | ICD-10-CM

## 2022-04-26 DIAGNOSIS — F17203 Nicotine dependence unspecified, with withdrawal: Secondary | ICD-10-CM | POA: Diagnosis not present

## 2022-04-26 DIAGNOSIS — F900 Attention-deficit hyperactivity disorder, predominantly inattentive type: Secondary | ICD-10-CM | POA: Diagnosis not present

## 2022-04-26 DIAGNOSIS — F331 Major depressive disorder, recurrent, moderate: Secondary | ICD-10-CM

## 2022-04-26 MED ORDER — NICOTINE 21 MG/24HR TD PT24
21.0000 mg | MEDICATED_PATCH | Freq: Every day | TRANSDERMAL | 1 refills | Status: DC
  Filled 2022-04-26: qty 28, 28d supply, fill #0

## 2022-04-26 MED ORDER — DULOXETINE HCL 30 MG PO CPEP
30.0000 mg | ORAL_CAPSULE | Freq: Every day | ORAL | 1 refills | Status: DC
  Filled 2022-04-26: qty 30, 30d supply, fill #0
  Filled 2022-05-25: qty 30, 30d supply, fill #1

## 2022-04-26 MED ORDER — GABAPENTIN 100 MG PO CAPS
100.0000 mg | ORAL_CAPSULE | Freq: Three times a day (TID) | ORAL | 1 refills | Status: DC
  Filled 2022-04-26: qty 90, 30d supply, fill #0
  Filled 2022-05-25: qty 90, 30d supply, fill #1

## 2022-04-26 NOTE — Progress Notes (Signed)
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  Patient was last seen by Dr. Ronne Binning on 05/03/2021 and at the end of the encounter, patient was being managed on the following medications:  Seroquel 50 mg at bedtime Trazodone 100 mg at bedtime as needed Atomoxetine 40 mg daily Nicotine (NicoDerm CQ) 21 mg / 24-hour patch  Patient was last seen at [facility] after presenting to the urgent care due to's thoughts of suicide on 09/28/2021.  Following patient's assessment, patient was agreeable to the [place].  During the assessment while at [facility] patient endorses passive suicidal ideations without plan or intent and denied homicidal ideations.  He further denied auditory or visual hallucinations.  He noted that he was dealing with recent stressors in the form of financial instability, issues with his living situation, and conflicts at work.  Patient was placed on Cymbalta during his stay and stated that he did not experience any adverse events or side effects while on the medication.  Today, patient presents today for medication management.  Patient states that when he was discharged from [facility], he was given a short supply of his medications.  Patient reports that he last took his medications roughly a month ago.  Patient admits to having multiple visits to [facility] in the past.  He reports that he often has passive suicidal thoughts or thoughts that he would be better  off dead but denies ever having an actual plan.  Patient clarified that the minute he starts having actual thoughts or plans to harm himself, he then seeks out help.  When discharged from [facility], patient was placed on Cymbalta 30 mg daily and trazodone 50 mg at bedtime.  Patient was asked how his experience was on his previously prescribed medications.  Patient states that he had to stop taking Strattera due to experiencing the sensation of feeling like he was holding his urine following sexual intercourse/ejaculation.  Patient was unable to give any details on his experience while taking Seroquel.  He does state that trazodone was very helpful due to experiencing insomnia during one of his spiraling episodes.  He reports that the only caveat to taking trazodone was that he experienced grogginess on occasion upon waking.  Patient currently has a diagnosis of depression but does have a history of being diagnosed with bipolar 2 disorder.  Endorses having fits of mania that can last between a day or 2 to an hour at a time.  It was later determined through consultation with Dr. Ronne Binning that his manic episodes may have more to do with anxiety and hyper fixation.  Patient reports that since taking Balter, his depressive symptoms have been more manageable; however, he continues to struggle with anxiety.  Patient reports being on gabapentin in the past for anxiety and states that the medication was beneficial to him.  Patient endorses being slightly depressed.  Patient states that he experiences depression roughly 5 out of 7 days of the week.  Patient depressive episodes are characterized by the following symptoms: increased rate and fog, negative self talk, decreased  concentration, and the desire to isolate.  In regards to the patient's brain fog, he believes that it may be attributed to his mental state or the lingering post effects from COVID.  Patient states that his brain fog is not alleviated by exercise.  Patient  denies any alleviating factors to his symptoms but does express that he oftentimes zones out and loses himself in activities.  In addition to depression, patient endorses anxiety and rates his anxiety as 7 out of 10.  Patient denies any new stressors at this time.  Patient reports that he finds himself dissociating more than being lucid.  He reports that he spent 80% of his day halfway between a state of dissociation and lucidity.  PHQ-9 screen was performed with the patient scoring an 11.  A GAD-7 screen was also performed with the patient scoring a 10.  Patient is alert and oriented x 4, calm, cooperative, and fully engaged in conversation during the encounter.  Patient endorses fair mood.  Patient denies suicidal or homicidal ideations.  He further denies auditory or visual hallucinations and does not appear to be responding to internal/external stimuli.  Patient endorses fair sleep and receives on average 6 hours of sleep each night.  Patient endorses fair appetite and eats on average 2 meals per day.  Patient endorses alcohol consumption socially.  Patient endorses tobacco use and smokes on average 2 to 4 cigarettes/day.  Patient expresses that he has been trying to quit his tobacco use.  Patient admits to being an avid cannabis user but states that he is currently abstaining from use in order to find work.  Visit Diagnosis:    ICD-10-CM   1. Nicotine dependence with withdrawal, unspecified nicotine product type  F17.203 nicotine (NICODERM CQ) 21 mg/24hr patch    2. Attention deficit hyperactivity disorder (ADHD), predominantly inattentive type  F90.0     3. GAD (generalized anxiety disorder)  F41.1 DULoxetine (CYMBALTA) 30 MG capsule    gabapentin (NEURONTIN) 100 MG capsule    4. Severe episode of recurrent major depressive disorder, without psychotic features (HCC)  F33.2 DULoxetine (CYMBALTA) 30 MG capsule      Past Psychiatric History:  Depression Anxiety Bipolar 2 disorder  Past  Medical History:  Past Medical History:  Diagnosis Date   Bipolar 2 disorder (HCC)     Past Surgical History:  Procedure Laterality Date   WISDOM TOOTH EXTRACTION      Family Psychiatric History:  Notes that his family has mental health conditions but they are not diagnosed   Family History:  Family History  Family history unknown: Yes    Social History:  Social History   Socioeconomic History   Marital status: Significant Other    Spouse name: Not on file   Number of children: Not on file   Years of education: Not on file   Highest education level: Not on file  Occupational History   Not on file  Tobacco Use   Smoking status: Never   Smokeless tobacco: Never  Vaping Use   Vaping Use: Every day   Substances: Nicotine, Flavoring  Substance and Sexual Activity   Alcohol use: Not Currently   Drug use: Yes    Types: Marijuana   Sexual activity: Yes    Birth control/protection: Condom  Other Topics Concern   Not on file  Social History Narrative   Not on file   Social Determinants of Health   Financial Resource Strain: Low Risk  (04/06/2022)   Overall Financial  Resource Strain (CARDIA)    Difficulty of Paying Living Expenses: Not hard at all  Food Insecurity: No Food Insecurity (06/03/2020)   Hunger Vital Sign    Worried About Running Out of Food in the Last Year: Never true    Ran Out of Food in the Last Year: Never true  Transportation Needs: No Transportation Needs (06/03/2020)   PRAPARE - Hydrologist (Medical): No    Lack of Transportation (Non-Medical): No  Physical Activity: Sufficiently Active (04/06/2022)   Exercise Vital Sign    Days of Exercise per Week: 3 days    Minutes of Exercise per Session: 90 min  Stress: Stress Concern Present (04/06/2022)   [place]    Feeling of Stress : Very much  Social Connections: Socially Isolated (04/06/2022)   Social  Connection and Isolation Panel [NHANES]    Frequency of Communication with Friends and Family: Never    Frequency of Social Gatherings with Friends and Family: More than three times a week    Attends Religious Services: Never    Marine scientist or Organizations: No    Attends Archivist Meetings: Never    Marital Status: Never married    Allergies:  Allergies  Allergen Reactions   Pineapple Hives    Metabolic Disorder Labs: Lab Results  Component Value Date   HGBA1C 5.4 09/28/2021   MPG 108 09/28/2021   No results found for: "PROLACTIN" Lab Results  Component Value Date   CHOL 155 09/28/2021   TRIG 30 09/28/2021   HDL 60 09/28/2021   CHOLHDL 2.6 09/28/2021   VLDL 6 09/28/2021   LDLCALC 89 09/28/2021   Lab Results  Component Value Date   TSH 0.700 09/28/2021   TSH 0.982 03/23/2021    Therapeutic Level Labs: No results found for: "LITHIUM" No results found for: "VALPROATE" No results found for: "CBMZ"  Current Medications: Current Outpatient Medications  Medication Sig Dispense Refill   gabapentin (NEURONTIN) 100 MG capsule Take 1 capsule (100 mg total) by mouth 3 (three) times daily. 90 capsule 1   nicotine (NICODERM CQ) 21 mg/24hr patch Place 1 patch (21 mg total) onto the skin daily. 28 patch 1   DULoxetine (CYMBALTA) 30 MG capsule Take 1 capsule (30 mg total) by mouth daily. 30 capsule 1   traZODone (DESYREL) 50 MG tablet Take 1 tablet (50 mg total) by mouth at bedtime as needed for sleep. 30 tablet 0   No current facility-administered medications for this visit.     Musculoskeletal: Strength & Muscle Tone: within normal limits Gait & Station: normal Patient leans: N/A  Psychiatric Specialty Exam: Review of Systems  Psychiatric/Behavioral:  Positive for decreased concentration and sleep disturbance. Negative for dysphoric mood, hallucinations, self-injury and suicidal ideas. The patient is nervous/anxious. The patient is not hyperactive.      There were no vitals taken for this visit.There is no height or weight on file to calculate BMI.  General Appearance: Well Groomed  Eye Contact:  Good  Speech:  Clear and Coherent and Normal Rate  Volume:  Normal  Mood:  Anxious and Depressed  Affect:  Congruent  Thought Process:  Coherent, Goal Directed, and Descriptions of Associations: Intact  Orientation:  Full (Time, Place, and Person)  Thought Content: WDL   Suicidal Thoughts:  No  Homicidal Thoughts:  No  Memory:  Immediate;   Good Recent;   Fair Remote;   Fair  Judgement:  Good  Insight:  Good  Psychomotor Activity:  Normal  Concentration:  Concentration: Fair and Attention Span: Fair  Recall:  Good  Fund of Knowledge: Good  Language: Good  Akathisia:  No  Handed:  Right  AIMS (if indicated): not done  Assets:  Communication Skills Desire for Improvement Financial Resources/Insurance Housing Leisure Time Physical Health Social Support  ADL's:  Intact  Cognition: WNL  Sleep:  Fair   Screenings: AUDIT    Advertising copywriter from 06/03/2020 in [facility]  Alcohol Use Disorder Identification Test Final Score (AUDIT) 4      GAD-7    Flowsheet Row Counselor from 04/26/2022 in [facility] Counselor from 04/06/2022 in [facility] Video Visit from 05/03/2021 in [facility] Office Visit from 03/23/2021 in [facility] Video Visit from 02/05/2021 in [facility]  Total GAD-7 Score 10 14 15 20 15       PHQ2-9    Flowsheet Row Counselor from 04/26/2022 in [facility] Counselor from 04/06/2022 in [facility] ED from 09/28/2021 in [facility] Counselor from 06/08/2021 in [facility] Video Visit from 05/03/2021 in [facility]  PHQ-2  Total Score 2 5 4 5 5   PHQ-9 Total Score 11 15 11 18 22       Flowsheet Row Office Visit from 04/26/2022 in [facility] Counselor from 04/06/2022 in [facility] ED from 09/28/2021 in [facility]  C-SSRS RISK CATEGORY Low Risk Error: Question 1 not populated High Risk        Assessment and Plan:   [facility] 31 year old male with a past psychiatric history significant for nicotine dependence, 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  She was last seen by Dr. [name] on 05/03/2021.  Patient was previously discharged from [facility] on 09/29/2021.  Patient was discharged on the following medications: Cymbalta 30 mg daily and trazodone 50 mg at bedtime.  Patient states that Cymbalta has been helpful in managing his depressive symptoms but denies its efficacy in the management of his anxiety.  Patient states that he continues to take trazodone during periods of insomnia but admits to occasionally experiencing grogginess.  Patient has a history of being on gabapentin, a medication that he finds helpful in the management of his anxiety.  Patient to be placed on gabapentin 100 mg 3 times daily for the management of his anxiety.   Patient reports that he is also trying to quit smoking and is interested in utilizing nicotine patches.  Patient to be placed on NicoDerm CQ 21 mg / 24-hour patch to aid in his cessation of tobacco products.  Patient was agreeable to recommendations.  Patient's medications to be e-prescribed to pharmacy of choice.  Collaboration of Care: Collaboration of Care: Medication Management AEB provider managing patient's psychiatric medications, Psychiatrist AEB patient being seen by a mental health provider, and  Referral or follow-up with counselor/therapist AEB patient being seen by a licensed clinical social worker at this facility  Patient/Guardian was advised Release of Information must be obtained prior to any record release in order to collaborate their care with an outside provider. Patient/Guardian was advised if they have not already done so  to contact the registration department to sign all necessary forms in order for [place] to release information regarding their care.   Consent: Patient/Guardian gives verbal consent for treatment and assignment of benefits for services provided during this visit. Patient/Guardian expressed understanding and agreed to proceed.   1. Nicotine dependence with withdrawal, unspecified nicotine product type  - nicotine (NICODERM CQ) 21 mg/24hr patch; Place 1 patch (21 mg total) onto the skin daily.  Dispense: 28 patch; Refill: 1  2. Attention deficit hyperactivity disorder (ADHD), predominantly inattentive type   3. GAD (generalized anxiety disorder)  - DULoxetine (CYMBALTA) 30 MG capsule; Take 1 capsule (30 mg total) by mouth daily.  Dispense: 30 capsule; Refill: 1 - gabapentin (NEURONTIN) 100 MG capsule; Take 1 capsule (100 mg total) by mouth 3 (three) times daily.  Dispense: 90 capsule; Refill: 1  4. Severe episode of recurrent major depressive disorder, without psychotic features ([place])  - DULoxetine (CYMBALTA) 30 MG capsule; Take 1 capsule (30 mg total) by mouth daily.  Dispense: 30 capsule; Refill: 1  Patient to follow-up in 6 weeks Provider spent a total of 22 minutes with the patient/reviewing patient's chart  Malachy Mood, PA 04/26/2022, 6:14 PM

## 2022-04-26 NOTE — Progress Notes (Signed)
   THERAPIST PROGRESS NOTE  Session Time: 59  Participation Level: Active  Behavioral Response: CasualAlertAnxious and Depressed  Type of Therapy: Individual Therapy  Treatment Goals addressed: Decreased PHQ-9 and GAD-7 below 10 for PHQ-9 and 5 for GAD-7  ProgressTowards Goals: Progressing  Interventions: CBT and Motivational Interviewing  Summary: Scott Gordon is a 31 y.o. male who presents with depressed and anxious mood\affect.  Patient was pleasant, cooperative, maintained good eye contact.  Patient was alert and oriented x5 and was dressed casually.  Comes in today with primary stressor as "brain fog", and self motivation.  Patient reports that he has been struggling with his day-to-day activities stating that he starts unmotivated and then anxiety increases for tension and worry and patient self isolates.  LCSW educated patient on prioritization.  LCSW and patient discussed a plan to make a prioritization list of goals that he wants to accomplish.  LCSW and patient spoke about ranking that list in order utmost importance to least important goals.  Patient then to prioritize the top 3-5 goals on his list.  LCSW and patient spoke about stressors today due to "brain fog".  Patient attributes his brain fog to COVID-19 but states that his mental health has not helped the situation.  LCSW spoke with patient about utilizing a planner to plan out his day to better understand and organize his day-to-day activities.  Suicidal/Homicidal: Nowithout intent/plan  Therapist Response:     Intervention/Plan: LCSW administered GAD-7.  LCSW administered the PHQ-9.  LCSW notes a decrease in both PHQ-9 and GAD-7.  LCSW supportive therapy for praise and encouragement.  LCSW used empowerment for person centered therapy.  LCSW used solution focused therapy for planning process for organizational needs such as utilizing a planner and a prioritization list.  LCSW educated patient on the importance of  educating himself on medication management evaluation today at 1 PM and maintaining medication regiment if he chooses to take medications for 4 to 8 weeks while they take full effect.  Plan: Return again in 2 weeks.  Diagnosis: No diagnosis found.  Collaboration of Care: Other None today   Patient/Guardian was advised Release of Information must be obtained prior to any record release in order to collaborate their care with an outside provider. Patient/Guardian was advised if they have not already done so to contact the registration department to sign all necessary forms in order for [place] to release information regarding their care.   Consent: Patient/Guardian gives verbal consent for treatment and assignment of benefits for services provided during this visit. Patient/Guardian expressed understanding and agreed to proceed.   Weber Cooks, LCSW 04/26/2022

## 2022-05-03 ENCOUNTER — Ambulatory Visit (INDEPENDENT_AMBULATORY_CARE_PROVIDER_SITE_OTHER): Payer: No Payment, Other | Admitting: Licensed Clinical Social Worker

## 2022-05-03 DIAGNOSIS — F900 Attention-deficit hyperactivity disorder, predominantly inattentive type: Secondary | ICD-10-CM

## 2022-05-03 DIAGNOSIS — F411 Generalized anxiety disorder: Secondary | ICD-10-CM

## 2022-05-03 DIAGNOSIS — F331 Major depressive disorder, recurrent, moderate: Secondary | ICD-10-CM | POA: Diagnosis not present

## 2022-05-03 NOTE — Progress Notes (Signed)
   THERAPIST PROGRESS NOTE  Session Time: 69  Participation Level: Active  Behavioral Response: CasualAlertAnxious and Depressed  Type of Therapy: Individual Therapy  Treatment Goals addressed: Identify three triggers for depression   ProgressTowards Goals: Progressing  Interventions: CBT, Motivational Interviewing, and Supportive  Summary: Scott Gordon is a 31 y.o. male who presents with depressed and anxious mood\affect.  Patient was pleasant, cooperative, maintained good eye contact.  Scott Gordon was alert and oriented x5.  He engaged well in therapy session and was dressed casually.  Patient reports primary stressors as medication management, increased anxiety attacks, and relationship.  Patient reports that he is in a polyamorous relationship and his significant other started talking to another individual.  Scott Gordon reports that 1 role in the relationship is that they are supposed to be open and honest about the people that they are talking to and communicate with each other before they pursue any other partners.  Scott Gordon reports that his significant other forgot about this role and engaged in a date and text messaging despite the rule.  Patient reports that his coping skills were running "low" due to the fact that he had just started medication management for anxiety and depression after several months of not taking any mental health medications.  Patient reports that the medications have overall improved his forgetfulness but increased his tension and increased his tension..    Suicidal/Homicidal: Nowithout intent/plan  Therapist Response:     Intervention/Plan: LCSW reviewed grounding exercises for "5, 4, 3, 2, 1 grounding technique".  LCSW reviewed the grounding technique for "categories".  LCSW provided patient with education materials on these grounding exercises.  Patient implement at least 1 grounding technique 1 time weekly.  LCSW educated patient on taking medications  as prescribed and stated to patient medications can take 4 to 8 weeks before taking full effect.  LCSW used supportive language for praise and encouragement.  LCSW used solution focused therapy to help patient come up with a plan for financial stability such as testing negative for marijuana and being able to apply for jobs.  Plan: Return again in 3 weeks.  Diagnosis: Moderate episode of recurrent major depressive disorder (HCC)  GAD (generalized anxiety disorder)  Attention deficit hyperactivity disorder (ADHD), predominantly inattentive type  Collaboration of Care: Other None reported today   Patient/Guardian was advised Release of Information must be obtained prior to any record release in order to collaborate their care with an outside provider. Patient/Guardian was advised if they have not already done so to contact the registration department to sign all necessary forms in order for [place] to release information regarding their care.   Consent: Patient/Guardian gives verbal consent for treatment and assignment of benefits for services provided during this visit. Patient/Guardian expressed understanding and agreed to proceed.   Weber Cooks, LCSW 05/03/2022

## 2022-05-17 ENCOUNTER — Ambulatory Visit (INDEPENDENT_AMBULATORY_CARE_PROVIDER_SITE_OTHER): Payer: No Payment, Other | Admitting: Licensed Clinical Social Worker

## 2022-05-17 DIAGNOSIS — F900 Attention-deficit hyperactivity disorder, predominantly inattentive type: Secondary | ICD-10-CM

## 2022-05-17 DIAGNOSIS — F322 Major depressive disorder, single episode, severe without psychotic features: Secondary | ICD-10-CM | POA: Diagnosis not present

## 2022-05-17 DIAGNOSIS — F411 Generalized anxiety disorder: Secondary | ICD-10-CM

## 2022-05-17 NOTE — Progress Notes (Signed)
   THERAPIST PROGRESS NOTE  Session Time: 59   Participation Level: Active  Behavioral Response: CasualAlertAnxious and Depressed  Type of Therapy: Individual Therapy  Treatment Goals addressed: Identify 3 triggers for depression  ProgressTowards Goals: Progressing  Interventions: CBT, Motivational Interviewing, and Supportive  Summary: Scott Gordon is a 31 y.o. male who presents with depressed and anxious mood\affect.  Patient was pleasant, cooperative, maintained good eye contact.  He engaged well in therapy session and was alert and oriented x5.  Patient reports primary stressors social anxiety.  Patient reports that he has been attempting to interact with one of his partners and videogames sessions along with her friends.  Patient reports that as he started doing good in the game he started to get more anxiety about how he had to "keep getting better".  LCSW spoke with patient today about realistic expectations for self.  LCSW spoke with patient about preparing self for social settings by giving him a good headspace.  This is by utilizing coping skills for walking or doing something relaxing for himself.  This will help decrease social anxiety when it occurs.  LCSW spoke about trauma from his childhood.  Patient reports that his sister and his mother used to watch for films when he was 31 years old and patient was provided a choice to either sit in a dark room isolated by himself or watching horror films with his mother and sister.  Patient reports that this still gives him anxiety to this day to watch for for months.  Patient reports as he has realized the root cause of his trauma he has been able to expose himself further to triggers such as watching horror or movies.   Suicidal/Homicidal: Nowithout intent/plan  Therapist Response:     Intervention/Plan: LCSW utilized supportive therapy for praise and encouragement.  LCSW utilized person centered therapy for unconditional  positive regard.  LCSW utilized CBT therapy for reframing.  LCSW spoke and educated patient on setting realistic expectations\goals.  LCSW spoke with patient about gradual exposure therapy.  Plan for patient is to continue to utilize coping skills by increasing social interactions to at least 2 times weekly.  Plan: Return again in 3 weeks.  Diagnosis: Severe major depression without psychotic features (HCC)  GAD (generalized anxiety disorder)  Attention deficit hyperactivity disorder (ADHD), predominantly inattentive type  Collaboration of Care: Other None today   Patient/Guardian was advised Release of Information must be obtained prior to any record release in order to collaborate their care with an outside provider. Patient/Guardian was advised if they have not already done so to contact the registration department to sign all necessary forms in order for [place] to release information regarding their care.   Consent: Patient/Guardian gives verbal consent for treatment and assignment of benefits for services provided during this visit. Patient/Guardian expressed understanding and agreed to proceed.   Weber Cooks, LCSW 05/17/2022

## 2022-05-25 ENCOUNTER — Other Ambulatory Visit: Payer: Self-pay

## 2022-06-02 ENCOUNTER — Encounter (HOSPITAL_COMMUNITY): Payer: Self-pay | Admitting: Student

## 2022-06-02 ENCOUNTER — Other Ambulatory Visit: Payer: Self-pay

## 2022-06-02 ENCOUNTER — Ambulatory Visit (INDEPENDENT_AMBULATORY_CARE_PROVIDER_SITE_OTHER): Payer: No Payment, Other | Admitting: Student

## 2022-06-02 VITALS — BP 120/88 | HR 88 | Wt 154.0 lb

## 2022-06-02 DIAGNOSIS — F17203 Nicotine dependence unspecified, with withdrawal: Secondary | ICD-10-CM | POA: Diagnosis not present

## 2022-06-02 DIAGNOSIS — F332 Major depressive disorder, recurrent severe without psychotic features: Secondary | ICD-10-CM | POA: Diagnosis not present

## 2022-06-02 DIAGNOSIS — F411 Generalized anxiety disorder: Secondary | ICD-10-CM | POA: Diagnosis not present

## 2022-06-02 MED ORDER — NICOTINE POLACRILEX 2 MG MT GUM
2.0000 mg | CHEWING_GUM | OROMUCOSAL | 0 refills | Status: DC | PRN
  Filled 2022-06-02: qty 50, 30d supply, fill #0

## 2022-06-02 MED ORDER — DULOXETINE HCL 60 MG PO CPEP
60.0000 mg | ORAL_CAPSULE | Freq: Every day | ORAL | 0 refills | Status: DC
  Filled 2022-06-02: qty 30, 30d supply, fill #0

## 2022-06-02 MED ORDER — NICOTINE 21 MG/24HR TD PT24
21.0000 mg | MEDICATED_PATCH | Freq: Every day | TRANSDERMAL | 1 refills | Status: DC
  Filled 2022-06-02: qty 28, 28d supply, fill #0

## 2022-06-02 MED ORDER — GABAPENTIN 100 MG PO CAPS
200.0000 mg | ORAL_CAPSULE | Freq: Three times a day (TID) | ORAL | 0 refills | Status: DC
  Filled 2022-06-02: qty 180, 30d supply, fill #0

## 2022-06-02 NOTE — Patient Instructions (Addendum)
Dear Scott Gordon,  It was a pleasure meeting you today!  Please take the medications as prescribed listed in the after visit summary. Report any adverse effects and or reactions from the medicines to your outpatient provider promptly. Do not engage in alcohol and or illegal drug use while on prescription medicines. In the event of worsening symptoms, call the crisis hotline, 911 and or go to the nearest ED for appropriate evaluation and treatment of symptoms. Follow-up with your primary care provider for your other medical issues, concerns and or health care needs.  Take Care! -Dr. Lurline Hare  Please follow up in about 4 weeks.

## 2022-06-02 NOTE — Progress Notes (Signed)
[facility] MD Outpatient Progress Note  06/02/2022 9:29 PM Scott Gordon  MRN:  [id number]  Assessment:  Scott Gordon presents for follow-up evaluation in-person. 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   Plan:  # Major Depressive Disorder- recurrent episode, severe w/o psychotic features Past medication trials:  wellbutrin (migraine, memory loss) Status of problem: Improving Interventions: -- INCREASE duloxetine to 60 mg daily for continued depressive symptoms -- Continue trazodone 50 mg qhs prn for insomnia -- Recommend continuing psychotherapy for management of present stressors  # Generalized Anxiety Disorder Past medication trials: hydroxyzine (ineffective) Status of problem: Improving Interventions: -- INCREASE gabapentin to 200 mg three times daily for anxiety symptoms  # Nicotine Use Disorder -Continue nicotine patch -START nicotine gum PRN  #Reported History of ADHD Unclear if meets criteria versus inattention and hyperactivity secondary to anxiety Past medication trials: Adderall (urinary retention), strattera (urinary retention) Status of problem: Stable Interventions -Continue to monitor symptoms  Patient was given contact information for behavioral health clinic and was instructed to call 911 for emergencies.   Subjective:  Chief Complaint:  Chief Complaint  Patient presents with   Anxiety    Interval History:  Patient was last seen by Trinna Post on 04/26/22 and at the end of the encounter, patient was being managed on the following medications: -Gabapentin 100 mg tid  -Nicotine patch 21 mg   -Cymbalta 30 mg daily for depression -Trazodone 50 mg qhs prn for insomnia  Scott Gordon states that the duloxetine and gabapentin overall were beneficial for his depression and anxiety respectively; however, they felt as if they did not provide much benefit after 2-3 weeks. Patient expresses that he feels that a higher dose of duloxetine and gabapentin may be beneficial to better managing residual anxiety and depressions symptoms. Scott Gordon states depression symptoms of inconsistent sleep, depressed mood, poor concentration, anhedonia, and low energy are still regularly problems he is managing. Patient's stressors include seeking employment, abstaining from marijuana, nicotine craving, wanting to move out of family's house. Patient reports anxiety still continues to be very high. Scott Gordon states he uses the PRN trazodone approximately 1-2 times every 2 weeks when his anxiety or depression prevent him from sleeping. Scott Gordon also notices when he has high anxiety, he would crave nicotine much more. Discussed starting nicotine patch to better manage nicotine craving. Discussed eventual trial of Chantix to cease nicotine use. Patient denies present SI/HI/AVH. Scott Gordon does endorse passive SI 2 days ago describing hopelessness and a feeling of inability to escape present situation and stressors. He states he attempts to occupy his time with his partners in order to manage anxiety and reduce self-isolation. He is able to contract for safety at this time and states he would go to [facility] to be evaluated should he experience SI with plan or intent.  Scott Gordon also expresses interest in neuropsychological testing for autism spectrum disorder due to difficulty interacting with others in general and makes rules for dealing with people as well as making unconventional rules that he follows such as putting up toilet seat and having to put toilet seat down before leaving bathroom. Discussed we would further explore his  symptoms in later visits once anxiety and depression is better controlled.  Scott Gordon also states he tried sister's adderall in  an attempt to manage his reported ADHD; however, while his attention and depression appear to improve, he experienced delayed urinary emptying that became very difficult to tolerate. Scott Gordon self-discontinued this medication trial   Visit Diagnosis:    ICD-10-CM   1. GAD (generalized anxiety disorder)  F41.1 DULoxetine (CYMBALTA) 60 MG capsule    gabapentin (NEURONTIN) 100 MG capsule    2. Severe episode of recurrent major depressive disorder, without psychotic features (HCC)  F33.2     3. Nicotine dependence with withdrawal, unspecified nicotine product type  F17.203 nicotine (NICODERM CQ) 21 mg/24hr patch    nicotine polacrilex (NICORETTE) 2 MG gum      Past Psychiatric History: Depression and Anxiety  Past Medical History:  Past Medical History:  Diagnosis Date   Anxiety    Depression     Past Surgical History:  Procedure Laterality Date   WISDOM TOOTH EXTRACTION      Family Psychiatric History:  Notes that his family has mental health conditions but they are not diagnosed   Family History:  Family History  Family history unknown: Yes    Social History:  Social History   Socioeconomic History   Marital status: Significant Other    Spouse name: Not on file   Number of children: Not on file   Years of education: Not on file   Highest education level: Not on file  Occupational History   Not on file  Tobacco Use   Smoking status: Never   Smokeless tobacco: Never  Vaping Use   Vaping Use: Every day   Substances: Nicotine, Flavoring  Substance and Sexual Activity   Alcohol use: Not Currently   Drug use: Yes    Types: Marijuana   Sexual activity: Yes    Birth control/protection: Condom  Other Topics Concern   Not on file  Social History Narrative   Not on file   Social Determinants of Health   Financial Resource Strain: Low  Risk  (04/06/2022)   Overall Financial Resource Strain (CARDIA)    Difficulty of Paying Living Expenses: Not hard at all  Food Insecurity: No Food Insecurity (06/03/2020)   Hunger Vital Sign    Worried About Running Out of Food in the Last Year: Never true    Ran Out of Food in the Last Year: Never true  Transportation Needs: No Transportation Needs (06/03/2020)   PRAPARE - Administrator, Civil Service (Medical): No    Lack of Transportation (Non-Medical): No  Physical Activity: Sufficiently Active (04/06/2022)   Exercise Vital Sign    Days of Exercise per Week: 3 days    Minutes of Exercise per Session: 90 min  Stress: Stress Concern Present (04/06/2022)   [employer] of Occupational Health - Occupational Stress Questionnaire    Feeling of Stress : Very much  Social Connections: Socially Isolated (04/06/2022)   Social Connection and Isolation Panel [NHANES]    Frequency of Communication with Friends and Family: Never    Frequency of Social Gatherings with Friends and Family: More than three times a week    Attends Religious Services: Never    Database administrator or Organizations: No    Attends Banker Meetings: Never    Marital Status: Never married    Allergies:  Allergies  Allergen Reactions   Pineapple Hives    Current Medications: Current Outpatient Medications  Medication Sig Dispense Refill   DULoxetine (CYMBALTA) 60 MG capsule Take 1 capsule (60 mg total) by mouth daily.  30 capsule 0   gabapentin (NEURONTIN) 100 MG capsule Take 2 capsules (200 mg total) by mouth 3 (three) times daily. 180 capsule 0   nicotine (NICODERM CQ) 21 mg/24hr patch Place 1 patch (21 mg total) onto the skin daily. 28 patch 1   nicotine polacrilex (NICORETTE) 2 MG gum Take 1 each (2 mg total) by mouth as needed for smoking cessation. 100 tablet 0   traZODone (DESYREL) 50 MG tablet Take 1 tablet (50 mg total) by mouth at bedtime as needed for sleep. 30 tablet 0   No  current facility-administered medications for this visit.    ROS: Review of Systems  Constitutional:  Positive for fatigue. Negative for activity change and appetite change.  Respiratory:  Negative for shortness of breath.   Gastrointestinal:  Negative for abdominal pain.  Genitourinary:  Negative for difficulty urinating, dysuria and frequency.  Psychiatric/Behavioral:  Positive for decreased concentration, dysphoric mood and sleep disturbance. Negative for agitation, behavioral problems, confusion, hallucinations, self-injury and suicidal ideas. The patient is nervous/anxious. The patient is not hyperactive.     Objective:  Psychiatric Specialty Exam: Blood pressure 120/88, pulse 88, weight 154 lb (69.9 kg), SpO2 100 %.Body mass index is 24.12 kg/m.  General Appearance: Casual  Eye Contact:  Good  Speech:  Clear and Coherent and Normal Rate  Volume:  Normal  Mood:  Euthymic  Affect:  Depressed  Thought Process:  Coherent, Goal Directed, and Linear  Orientation:  Full (Time, Place, and Person)  Thought Content: WDL and Logical   Suicidal Thoughts:  No  Homicidal Thoughts:  No  Memory:  Negative  Judgment:  Good  Insight:  Good  Psychomotor Activity:  Normal  Concentration:  Concentration: Fair and Attention Span: Fair  Recall:  Good  Fund of Knowledge: Good  Language: Good  Akathisia:  Negative  Handed:  Right  AIMS (if indicated): not done  Assets:  Communication Skills Desire for Improvement Housing Intimacy Leisure Time Physical Health Resilience Social Support Talents/Skills Transportation Vocational/Educational  ADL's:  Intact  Cognition: WNL  Sleep:  Fair   PE: General: well-appearing; no acute distress  Pulm: no increased work of breathing on room air  Strength & Muscle Tone: within normal limits Neuro: no focal neurological deficits observed  Gait & Station: normal  Metabolic Disorder Labs: Lab Results  Component Value Date   HGBA1C 5.4  09/28/2021   MPG 108 09/28/2021   No results found for: "PROLACTIN" Lab Results  Component Value Date   CHOL 155 09/28/2021   TRIG 30 09/28/2021   HDL 60 09/28/2021   CHOLHDL 2.6 09/28/2021   VLDL 6 09/28/2021   LDLCALC 89 09/28/2021   Lab Results  Component Value Date   TSH 0.700 09/28/2021   TSH 0.982 03/23/2021    Therapeutic Level Labs: No results found for: "LITHIUM" No results found for: "VALPROATE" No results found for: "CBMZ"  Screenings: AUDIT    Flowsheet Row Counselor from 06/03/2020 in [facility]  Alcohol Use Disorder Identification Test Final Score (AUDIT) 4      GAD-7    Flowsheet Row Counselor from 04/26/2022 in [facility] Counselor from 04/06/2022 in [facility] Video Visit from 05/03/2021 in [facility] Office Visit from 03/23/2021 in [facility] Video Visit from 02/05/2021 in [facility]  Total GAD-7 Score 10 14 15 20 15       PHQ2-9    Flowsheet  Row Counselor from 04/26/2022 in [facility] Counselor from 04/06/2022 in [facility] from 09/28/2021 in [facility] Counselor from 06/08/2021 in [facility] Video Visit from 05/03/2021 in [facility]  PHQ-2 Total Score 2 5 4 5 5   PHQ-9 Total Score 11 15 11 18 22       Flowsheet Row Office Visit from 04/26/2022 in [facility] Counselor from 04/06/2022 in [facility] ED from 09/28/2021 in [facility]  C-SSRS RISK CATEGORY Low Risk Error: Question 1 not populated High Risk       Collaboration of Care:   Patient/Guardian was advised Release of Information must be obtained prior to any record release in order to collaborate their care with an  outside provider. Patient/Guardian was advised if they have not already done so to contact the registration department to sign all necessary forms in order for 09/30/2021 to release information regarding their care.   Consent: Patient/Guardian gives verbal consent for treatment and assignment of benefits for services provided during this visit. Patient/Guardian expressed understanding and agreed to proceed.   A total of 30 minutes was spent involved in face to face clinical care, chart review, documentation.   [facility], MD 06/02/2022, 9:29 PM

## 2022-06-03 ENCOUNTER — Other Ambulatory Visit: Payer: Self-pay

## 2022-06-07 ENCOUNTER — Encounter (HOSPITAL_COMMUNITY): Payer: Self-pay | Admitting: Physician Assistant

## 2022-06-08 ENCOUNTER — Ambulatory Visit (INDEPENDENT_AMBULATORY_CARE_PROVIDER_SITE_OTHER): Payer: No Payment, Other | Admitting: Licensed Clinical Social Worker

## 2022-06-08 DIAGNOSIS — F331 Major depressive disorder, recurrent, moderate: Secondary | ICD-10-CM

## 2022-06-08 DIAGNOSIS — F411 Generalized anxiety disorder: Secondary | ICD-10-CM

## 2022-06-08 NOTE — Progress Notes (Signed)
   THERAPIST PROGRESS NOTE  Session Time: 30  Participation Level: Active  Behavioral Response: CasualAlertAnxious and Depressed  Type of Therapy: Individual Therapy  Treatment Goals addressed: [name] WILL PARTICIPATE IN AT LEAST 80% OF SCHEDULED INDIVIDUAL PSYCHOTHERAPY  SESSIONS  ProgressTowards Goals: Progressing  Interventions: Motivational Interviewing and Strength-based  Summary: Scott Gordon is a 31 y.o. male who presents with depressed and anxious mood\affect.  Patient was pleasant, cooperative, maintained good eye contact.  He engaged well in therapy session was dressed casually.  [name] was alert and oriented x5.  Primary stressor for patient are work, relationship, and financials.  Patient reports that things in his relationship have started to improve with open and honest communication.  Patient reports he is in a polyamorous relationship and a roll was broken up talking to another person without communicating that with each other first.  Other stressors for patient are work as patient is still out of employment.  The patient reports that he has been applying for jobs daily but has not received any call backs.  [name] does report good news of testing negative for marijuana for the first time after 3 months of abstaining from marijuana use.  Suicidal/Homicidal: Nowithout intent/plan  Therapist Response:     Intervention/Plan: LCSW utilized supportive therapy for praise and encouragement.  LCSW administered the GAD-7.  LCSW administered PHQ-9.  LCSW notes a decrease in both PHQ-9 and GAD-7.  LCSW reviewed scores with patient.  LCSW utilized motivational interviewing for open-ended questions, reflective listening, and positive affirmations.  LCSW and patient spoke about a plan for employment.  Patient to apply for 3 jobs daily Monday through Friday.  Patient is to follow-up with 3 jobs he is applied for Monday through Friday.  If patient does not have employment or  interviews by November 1 patient to look into updating resume through [employer] works.  Plan: Return again in 3 weeks.  Diagnosis: Moderate episode of recurrent major depressive disorder (HCC)  GAD (generalized anxiety disorder)  Collaboration of Care: Other None today  Patient/Guardian was advised Release of Information must be obtained prior to any record release in order to collaborate their care with an outside provider. Patient/Guardian was advised if they have not already done so to contact the registration department to sign all necessary forms in order for [place] to release information regarding their care.   Consent: Patient/Guardian gives verbal consent for treatment and assignment of benefits for services provided during this visit. Patient/Guardian expressed understanding and agreed to proceed.   Dory Horn, LCSW 06/08/2022

## 2022-06-09 ENCOUNTER — Encounter (HOSPITAL_COMMUNITY): Payer: No Payment, Other | Admitting: Student

## 2022-06-29 ENCOUNTER — Ambulatory Visit (INDEPENDENT_AMBULATORY_CARE_PROVIDER_SITE_OTHER): Payer: No Payment, Other | Admitting: Licensed Clinical Social Worker

## 2022-06-29 DIAGNOSIS — F411 Generalized anxiety disorder: Secondary | ICD-10-CM

## 2022-06-29 DIAGNOSIS — F331 Major depressive disorder, recurrent, moderate: Secondary | ICD-10-CM | POA: Diagnosis not present

## 2022-06-29 NOTE — Patient Instructions (Incomplete)
Dear Mr. Bowring,  It was good seeing you again. The following adjustments have been made to your medications: -Increase duloxetine to 90 mg daily (3 capsules of 30 mg) -Continue gabapentin 200 mg three times a day -Continue nicotine patch and gum  Report any adverse effects and or reactions from the medicines to your outpatient provider promptly. Do not engage in alcohol and or illegal drug use while on prescription medicines. In the event of worsening symptoms, call the crisis hotline, 911 and or go to the nearest ED for appropriate evaluation and treatment of symptoms. Follow-up with your primary care provider for your other medical issues, concerns and or health care needs.  Take care! Dr. Lurline Hare

## 2022-06-29 NOTE — Progress Notes (Signed)
BH MD Outpatient Progress Note  06/30/2022 3:01 PM Scott Gordon  MRN:  [id number]  Assessment:  Scott Gordon presents for follow-up evaluation in-person for depression and anxiety.  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    Plan: # Major Depressive Disorder- recurrent episode, severe w/o psychotic features Past medication trials:  wellbutrin (migraine, memory loss) Status of problem: Improving Interventions: -- INCREASE duloxetine to 60 mg to 90 mg daily for continued depressive symptoms -- Continue trazodone 50 mg qhs prn for insomnia -- Recommend continuing psychotherapy for management of present stressors   # Generalized Anxiety Disorder Past medication trials: hydroxyzine (ineffective) Status of problem: Improving Interventions: -- Continue gabapentin to 200 mg three times daily for anxiety symptoms   # Nicotine Use Disorder, improving -Continue nicotine patch -Continue nicotine gum PRN   #Reported History of ADHD Unclear if meets criteria versus inattention and hyperactivity secondary to anxiety Past medication trials: Adderall (urinary retention), strattera (urinary retention) Status of problem: Stable Interventions -Continue to monitor symptoms   Patient was given contact information for behavioral health clinic and was instructed to call 911 for emergencies.   Subjective:  Chief Complaint:  Chief Complaint  Patient presents with   Anxiety   Depression    Interval History:  Scott Gordon was seen today for follow-up visit for medication management.   He reports that he had went to the pharmacy and they told him to double up on his  gabapentin and then come back for refill.  Patient states that he was never able to refill it because of financial problems.  He states that the increase in gabapentin dosage was very effective in managing his anxiety.  He states that the nicotine cravings also went down with nicotine patch and gum.  He states that after he ran out of gabapentin, he felt his anxiety had drastically increased.  He attempted to use trazodone to at least help him sleep for the past 2 weeks but this was not very effective in maintaining sleep. Reports increased anxiety for past 2 weeks has led to some passive SI but he is still able to contract for safety at this time.  He states that when his anxiety was controlled the first 2 weeks, he did not have any passive SI at all.  He continues to deny HI/AVH.  He continues to experience depressive symptoms including anhedonia, depression, poor energy, poor concentration, poor sleep.  He specifically mentions how he used to program videogames and play instruments which she has not been able to do recently.  Discussed increasing the duloxetine to help with this as well as continuing psychotherapy.  He was amenable to the plan at this time.  Scott Gordon has recently had difficulty with employment as his last job was in June.  He states that he has been applying to multiple job of openings and none of them have gotten back to him.  He states that he is now working to apply to [employer] despite the low wage.  However, he states that he wants to be able to have some financial contribution as he currently still is living with mother.  He is goal-directed and future oriented as he continues to work on his goal to obtaining an apartment that he can manage with one of his  partners.  He continues to experience erectile dysfunction that has not been a problem for the past 2 years.  He attribute this to depression.  He states that with or without antidepressant, he continued to have erectile  dysfunction and states that current medication regiment did not increase his erectile dysfunction.  I discussed with him that he can follow-up with a primary care doctor in order to establish care as well as receive possible medication for erectile dysfunction.  Patient was agreeable to plan.  He continues to abstain from marijuana use primarily for seeking employment.  He expresses desire to resume marijuana use given anxiety as well as sleep but understands that there is risk involved with worsening anxiety.  He is motivated to continue abstaining from marijuana at this time.   Visit Diagnosis:    ICD-10-CM   1. Substance induced mood disorder (HCC)  F19.94     2. GAD (generalized anxiety disorder)  F41.1 DULoxetine (CYMBALTA) 30 MG capsule    gabapentin (NEURONTIN) 100 MG capsule    3. Moderate episode of recurrent major depressive disorder (HCC)  F33.1     4. Nicotine dependence with withdrawal, unspecified nicotine product type  F17.203 nicotine (NICODERM CQ) 21 mg/24hr patch    nicotine polacrilex (NICORETTE) 2 MG gum      Past Psychiatric History: Depression and Anxiety  Past Medical History:  Past Medical History:  Diagnosis Date   Anxiety    Depression     Past Surgical History:  Procedure Laterality Date   WISDOM TOOTH EXTRACTION      Family Psychiatric History: Notes that his family has mental health conditions but they are not diagnosed   Family History:  Family History  Family history unknown: Yes    Social History:  Social History   Socioeconomic History   Marital status: Significant Other    Spouse name: Not on file   Number of children: Not on file   Years of education: Not on file   Highest education level: Not on file  Occupational History   Not on file  Tobacco Use   Smoking status: Never   Smokeless tobacco: Never  Vaping Use   Vaping Use: Every day   Substances: Nicotine, Flavoring  Substance and Sexual Activity   Alcohol use: Not  Currently   Drug use: Yes    Types: Marijuana   Sexual activity: Yes    Birth control/protection: Condom  Other Topics Concern   Not on file  Social History Narrative   Not on file   Social Determinants of Health   Financial Resource Strain: Low Risk  (04/06/2022)   Overall Financial Resource Strain (CARDIA)    Difficulty of Paying Living Expenses: Not hard at all  Food Insecurity: No Food Insecurity (06/03/2020)   Hunger Vital Sign    Worried About Running Out of Food in the Last Year: Never true    Ran Out of Food in the Last Year: Never true  Transportation Needs: No Transportation Needs (06/03/2020)   PRAPARE - Administrator, Civil Service (Medical): No    Lack of Transportation (Non-Medical): No  Physical Activity: Sufficiently Active (04/06/2022)   Exercise Vital Sign    Days of Exercise per Week: 3 days    Minutes of Exercise per Session: 90 min  Stress: Stress Concern Present (04/06/2022)   [employer] of Occupational Health - Occupational Stress Questionnaire    Feeling of Stress : Very much  Social Connections: Socially Isolated (04/06/2022)  Social Licensed conveyancer [NHANES]    Frequency of Communication with Friends and Family: Never    Frequency of Social Gatherings with Friends and Family: More than three times a week    Attends Religious Services: Never    Marine scientist or Organizations: No    Attends Archivist Meetings: Never    Marital Status: Never married    Allergies:  Allergies  Allergen Reactions   Pineapple Hives    Current Medications: Current Outpatient Medications  Medication Sig Dispense Refill   DULoxetine (CYMBALTA) 30 MG capsule Take 3 capsules (90 mg total) by mouth daily. 90 capsule 0   gabapentin (NEURONTIN) 100 MG capsule Take 2 capsules (200 mg total) by mouth 3 (three) times daily. 180 capsule 0   nicotine (NICODERM CQ) 21 mg/24hr patch Place 1 patch (21 mg total) onto the skin daily. 28  patch 1   nicotine polacrilex (NICORETTE) 2 MG gum Chew 1 gum (2 mg total) by mouth as needed for smoking cessation. 100 tablet 0   traZODone (DESYREL) 50 MG tablet Take 1 tablet (50 mg total) by mouth at bedtime as needed for sleep. 30 tablet 0   No current facility-administered medications for this visit.    ROS: Review of Systems  Objective:  Psychiatric Specialty Exam: Blood pressure 120/88, pulse (!) 110, weight 159 lb (72.1 kg).Body mass index is 24.9 kg/m.  General Appearance: Casual  Eye Contact:  Good  Speech:  Normal Rate  Volume:  Normal  Mood:  Anxious  Affect:  Appropriate and Congruent  Thought Process:  Coherent, Goal Directed, and Linear  Orientation:  Negative  Thought Content: Logical   Suicidal Thoughts:  No  Homicidal Thoughts:  No  Memory:  Negative  Judgment:  Fair  Insight:  Fair  Psychomotor Activity:  Normal  Concentration:  Concentration: Fair and Attention Span: Fair  Recall:  Good  Fund of Knowledge: Good  Language: Good  Akathisia:  Negative  Handed:  Right  AIMS (if indicated): not done  Assets:  Communication Skills Desire for Improvement Housing Intimacy Physical Health Resilience Social Support  ADL's:  Intact  Cognition: WNL  Sleep:  Poor   PE: General: well-appearing; no acute distress  Pulm: no increased work of breathing on room air  Strength & Muscle Tone: within normal limits Neuro: no focal neurological deficits observed  Gait & Station: normal  Metabolic Disorder Labs: Lab Results  Component Value Date   HGBA1C 5.4 09/28/2021   MPG 108 09/28/2021   No results found for: "PROLACTIN" Lab Results  Component Value Date   CHOL 155 09/28/2021   TRIG 30 09/28/2021   HDL 60 09/28/2021   CHOLHDL 2.6 09/28/2021   VLDL 6 09/28/2021   LDLCALC 89 09/28/2021   Lab Results  Component Value Date   TSH 0.700 09/28/2021   TSH 0.982 03/23/2021    Therapeutic Level Labs: No results found for: "LITHIUM" No results  found for: "VALPROATE" No results found for: "CBMZ"  Screenings: AUDIT    Flowsheet Row Counselor from 06/03/2020 in [facility]  Alcohol Use Disorder Identification Test Final Score (AUDIT) 4      GAD-7    Flowsheet Row Counselor from 06/08/2022 in [facility] Counselor from 04/26/2022 in [facility] Counselor from 04/06/2022 in [facility] Video Visit from 05/03/2021 in [facility] Office Visit from 03/23/2021 in [facility]  Total GAD-7 Score 6 10 14 15 20       PHQ2-9    Flowsheet Row Counselor from 06/08/2022 in [facility] Counselor from 04/26/2022 in [facility] Counselor from 04/06/2022 in [facility] ED from 09/28/2021 in [facility] Counselor from 06/08/2021 in [facility]  PHQ-2 Total Score 2 2 5 4 5   PHQ-9 Total Score 8 11 15 11 18       Flowsheet Row Counselor from 06/08/2022 in [facility] Office Visit from 04/26/2022 in [facility] Counselor from 04/06/2022 in [facility]  C-SSRS RISK CATEGORY Low Risk Low Risk Error: Question 1 not populated       Collaboration of Care: Collaboration of Care:   Patient/Guardian was advised Release of Information must be obtained prior to any record release in order to collaborate their care with an outside provider. Patient/Guardian was advised if they have not already done so to contact the registration department to sign all necessary forms in order for [facility] to release information regarding their care.   Consent: Patient/Guardian gives verbal consent for treatment and assignment of benefits for services provided during this visit. Patient/Guardian expressed understanding  and agreed to proceed.   A total of 30 minutes was spent involved in face to face clinical care, chart review, documentation.   06/06/2022, MD 06/30/2022, 3:01 PM

## 2022-06-29 NOTE — Progress Notes (Signed)
   THERAPIST PROGRESS NOTE  Virtual Visit via Video Note  I connected with Scott Gordon on 06/29/22 at  2:00 PM EDT by a video enabled telemedicine application and verified that I am speaking with the correct person using two identifiers.  Location: Patient: [facility]  Provider: Providers Home    I discussed the limitations of evaluation and management by telemedicine and the availability of in person appointments. The patient expressed understanding and agreed to proceed.     I discussed the assessment and treatment plan with the patient. The patient was provided an opportunity to ask questions and all were answered. The patient agreed with the plan and demonstrated an understanding of the instructions.   The patient was advised to call back or seek an in-person evaluation if the symptoms worsen or if the condition fails to improve as anticipated.  I provided 30 minutes of non-face-to-face time during this encounter.   Dory Horn, LCSW   Participation Level: Active  Behavioral Response: CasualAlertAnxious and Depressed  Type of Therapy: Individual Therapy  Treatment Goals addressed: Identify three triggers for depression    ProgressTowards Goals: Progressing  Interventions: Motivational Interviewing and Supportive    Suicidal/Homicidal: Nowithout intent/plan  Therapist Response:  Pt was alert and oriented x 5. He was dressed casually and engaged well in therapy session. Scott Gordon was pleasant, cooperative, and maintained good eye contact. Pt presented with anxious and depressed mood/affect.   Pt reports primary stressor and work and medications management. Scott Gordon states he has not taken his medication in two weeks because he cannot afford them. Pt reports he did not want to ask for help because he felt like a burden to the loved ones. Pt reports tomorrow after his medication appointment he plans to ask for help through his partners. Pt reports other  stressors as work. Pt does not have employment currently. He has two leads one through his sister and another through one of his partners. Pt reports optimistic outlook to find employment. Pt endorses increased tension, worry, worthlessness, and hopelessness.    Interventions/Plan: LCSW used solution focused therapy for explaining the process of medications refill requests. LCSW used supportive therapy for praise and encouragement. LCSW used reflective listening, open ended questions, and reflective listening. LCSW used psychoanalytic therapy for pt to express thoughts feelings and emotions. Pt to continue to apply to 2 jobs daily. Pt to get back on medications as prescribed 7/7 days per week.      Plan: Return again in 3 weeks.  Diagnosis: No diagnosis found.  Collaboration of Care: Other none today   Patient/Guardian was advised Release of Information must be obtained prior to any record release in order to collaborate their care with an outside provider. Patient/Guardian was advised if they have not already done so to contact the registration department to sign all necessary forms in order for [place] to release information regarding their care.   Consent: Patient/Guardian gives verbal consent for treatment and assignment of benefits for services provided during this visit. Patient/Guardian expressed understanding and agreed to proceed.   Dory Horn, LCSW 06/29/2022

## 2022-06-29 NOTE — Plan of Care (Signed)
  Problem: Depression CCP Problem  1 MDD  Goal:  Identify three triggers for depression  Outcome: Progressing Goal: create 3 future goals  Outcome: Progressing Goal: STG: [name] WILL PARTICIPATE IN AT LEAST 80% OF SCHEDULED INDIVIDUAL PSYCHOTHERAPY SESSIONS Outcome: Progressing Goal: STG: [name] WILL COMPLETE AT LEAST 80% OF ASSIGNED HOMEWORK Outcome: Progressing   Problem: Anxiety Disorder CCP Problem  1 GAD  Goal: STG: Patient will attend at least 80% of scheduled group psychotherapy sessions Outcome: Progressing Goal: STG: Patient will practice problem solving skills 3 times per week for the next 4 weeks Outcome: Progressing   Problem: Depression CCP Problem  1 MDD  Goal: Decrease PHQ-9 below 10  Outcome: Not Progressing Goal:  Journal 2 x weekly  Outcome: Not Progressing   Problem: Anxiety Disorder CCP Problem  1 GAD  Goal: LTG: Patient will score less than 5 on the Generalized Anxiety Disorder 7 Scale (GAD-7) Outcome: Not Progressing Goal: STG: Patient will complete at least 80% of assigned homework Outcome: Not Progressing

## 2022-06-30 ENCOUNTER — Other Ambulatory Visit: Payer: Self-pay

## 2022-06-30 ENCOUNTER — Ambulatory Visit (HOSPITAL_COMMUNITY): Payer: Self-pay | Admitting: Student

## 2022-06-30 VITALS — BP 120/88 | HR 110 | Wt 159.0 lb

## 2022-06-30 DIAGNOSIS — F1994 Other psychoactive substance use, unspecified with psychoactive substance-induced mood disorder: Secondary | ICD-10-CM

## 2022-06-30 DIAGNOSIS — F17203 Nicotine dependence unspecified, with withdrawal: Secondary | ICD-10-CM

## 2022-06-30 DIAGNOSIS — F17213 Nicotine dependence, cigarettes, with withdrawal: Secondary | ICD-10-CM

## 2022-06-30 DIAGNOSIS — F331 Major depressive disorder, recurrent, moderate: Secondary | ICD-10-CM

## 2022-06-30 DIAGNOSIS — F411 Generalized anxiety disorder: Secondary | ICD-10-CM

## 2022-06-30 MED ORDER — GABAPENTIN 100 MG PO CAPS
200.0000 mg | ORAL_CAPSULE | Freq: Three times a day (TID) | ORAL | 0 refills | Status: DC
  Filled 2022-06-30: qty 180, 30d supply, fill #0

## 2022-06-30 MED ORDER — NICOTINE 21 MG/24HR TD PT24
21.0000 mg | MEDICATED_PATCH | Freq: Every day | TRANSDERMAL | 1 refills | Status: DC
  Filled 2022-06-30: qty 28, 28d supply, fill #0

## 2022-06-30 MED ORDER — NICOTINE POLACRILEX 2 MG MT GUM
2.0000 mg | CHEWING_GUM | OROMUCOSAL | 0 refills | Status: DC | PRN
  Filled 2022-06-30: qty 100, 50d supply, fill #0

## 2022-06-30 MED ORDER — DULOXETINE HCL 30 MG PO CPEP
90.0000 mg | ORAL_CAPSULE | Freq: Every day | ORAL | 0 refills | Status: DC
  Filled 2022-06-30: qty 90, 30d supply, fill #0

## 2022-07-01 ENCOUNTER — Other Ambulatory Visit: Payer: Self-pay

## 2022-07-01 MED ORDER — GABAPENTIN 100 MG PO CAPS
200.0000 mg | ORAL_CAPSULE | Freq: Three times a day (TID) | ORAL | 0 refills | Status: DC
  Filled 2022-07-01 – 2022-08-02 (×2): qty 180, 30d supply, fill #0

## 2022-07-01 MED ORDER — NICOTINE 21 MG/24HR TD PT24
21.0000 mg | MEDICATED_PATCH | Freq: Every day | TRANSDERMAL | 1 refills | Status: DC
  Filled 2022-07-01 – 2022-08-02 (×2): qty 28, 28d supply, fill #0

## 2022-07-01 MED ORDER — NICOTINE POLACRILEX 2 MG MT GUM
2.0000 mg | CHEWING_GUM | OROMUCOSAL | 0 refills | Status: DC | PRN
  Filled 2022-07-01: qty 100, 50d supply, fill #0

## 2022-07-01 MED ORDER — DULOXETINE HCL 30 MG PO CPEP
90.0000 mg | ORAL_CAPSULE | Freq: Every day | ORAL | 0 refills | Status: DC
  Filled 2022-07-01: qty 90, 30d supply, fill #0

## 2022-07-07 ENCOUNTER — Ambulatory Visit (INDEPENDENT_AMBULATORY_CARE_PROVIDER_SITE_OTHER): Payer: No Payment, Other | Admitting: Student

## 2022-07-07 DIAGNOSIS — F411 Generalized anxiety disorder: Secondary | ICD-10-CM

## 2022-07-07 MED ORDER — DULOXETINE HCL 30 MG PO CPEP: 60.0000 mg | ORAL_CAPSULE | Freq: Every day | ORAL | 0 refills | Status: DC

## 2022-07-07 NOTE — Progress Notes (Signed)
BH MD Outpatient Progress Note  07/13/2022 5:12 PM Scott Gordon  MRN:  [id number]  Assessment:  Scott Gordon presents for follow-up evaluation in-person for depression and anxiety.  After resuming gabapentin, his anxiety has significantly subsided.  He did notice that he was experiencing overflow incontinence which likely was secondary to the increase in duloxetine as it does have anticholinergic effects that may have led to his incontinence.  He does report that recently it has improved but we will decrease the dose for now and continue to monitor.  Patient was agreeable to this plan and he will be following up in approximately 3 weeks.   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    Plan: # Major Depressive Disorder- recurrent episode, severe w/o psychotic features Past medication trials:  wellbutrin (migraine, memory loss) Status of problem: Improving Interventions: -- Decrease duloxetine back to 60 mg qhs given overflow incontinence -- Continue trazodone 50 mg qhs prn for insomnia -- Recommend continuing psychotherapy for management of present stressors   # Generalized Anxiety Disorder Past medication trials: hydroxyzine (ineffective) Status of problem: Improving Interventions: -- Continue gabapentin to 200 mg three times daily for anxiety symptoms   # Nicotine Use Disorder, improving -Continue nicotine patch -Continue nicotine gum PRN   #Reported History of ADHD Unclear if meets criteria versus inattention and hyperactivity secondary to anxiety Past medication trials: Adderall (urinary retention), strattera (urinary retention) Status of problem:  Stable Interventions -Continue to monitor symptoms   Patient was given contact information for behavioral health clinic and was instructed to call 911 for emergencies.   Subjective:  Chief Complaint:  Chief Complaint  Patient presents with   Urinary Incontinence   Depression    Interval History:  Patient returns after 1 week for earlier follow-up after noticing he had overflow incontinence.  He states that several seconds after urinating, he would become incontinent.  We discussed the possibility that this was secondary to Cymbalta adjustment.  Based on this we have reduced the Cymbalta back to 60 mg nightly.  He reports that his anxiety has improved ever since resuming gabapentin.  He has no other major concerns at this time.  He denies present SI/HI/AVH.  Visit Diagnosis:    ICD-10-CM   1. GAD (generalized anxiety disorder)  F41.1 DULoxetine (CYMBALTA) 30 MG capsule      Past Psychiatric History: Depression and anxiety  Past Medical History:  Past Medical History:  Diagnosis Date   Anxiety    Depression     Past Surgical History:  Procedure Laterality Date   WISDOM TOOTH EXTRACTION      Family Psychiatric History: Notes that his family has mental health conditions but they are not diagnosed    Family History:  Family History  Family history unknown: Yes    Social History:  Social History   Socioeconomic History   Marital status: Significant Other    Spouse name: Not on file   Number of children: Not on file   Years of education: Not on file   Highest education level: Not on file  Occupational History   Not on file  Tobacco Use   Smoking status: Never   Smokeless tobacco: Never  Vaping Use   Vaping Use: Every day   Substances: Nicotine, Flavoring  Substance and  Sexual Activity   Alcohol use: Not Currently   Drug use: Yes    Types: Marijuana   Sexual activity: Yes    Birth control/protection: Condom  Other Topics Concern   Not on file  Social  History Narrative   Not on file   Social Determinants of Health   Financial Resource Strain: Low Risk  (04/06/2022)   Overall Financial Resource Strain (CARDIA)    Difficulty of Paying Living Expenses: Not hard at all  Food Insecurity: No Food Insecurity (06/03/2020)   Hunger Vital Sign    Worried About Running Out of Food in the Last Year: Never true    Ran Out of Food in the Last Year: Never true  Transportation Needs: No Transportation Needs (06/03/2020)   PRAPARE - Hydrologist (Medical): No    Lack of Transportation (Non-Medical): No  Physical Activity: Sufficiently Active (04/06/2022)   Exercise Vital Sign    Days of Exercise per Week: 3 days    Minutes of Exercise per Session: 90 min  Stress: Stress Concern Present (04/06/2022)   [place]    Feeling of Stress : Very much  Social Connections: Socially Isolated (04/06/2022)   Social Connection and Isolation Panel [NHANES]    Frequency of Communication with Friends and Family: Never    Frequency of Social Gatherings with Friends and Family: More than three times a week    Attends Religious Services: Never    Marine scientist or Organizations: No    Attends Archivist Meetings: Never    Marital Status: Never married    Allergies:  Allergies  Allergen Reactions   Pineapple Hives    Current Medications: Current Outpatient Medications  Medication Sig Dispense Refill   DULoxetine (CYMBALTA) 30 MG capsule Take 2 capsules (60 mg total) by mouth at bedtime. 60 capsule 0   [START ON 07/30/2022] gabapentin (NEURONTIN) 100 MG capsule Take 2 capsules (200 mg total) by mouth 3 (three) times daily. 180 capsule 0   [START ON 07/30/2022] nicotine (NICODERM CQ) 21 mg/24hr patch Place 1 patch (21 mg total) onto the skin daily. 28 patch 1   nicotine polacrilex (NICORETTE) 2 MG gum Chew 1 gum (2 mg total) by mouth as needed for smoking  cessation. 100 tablet 0   traZODone (DESYREL) 50 MG tablet Take 1 tablet (50 mg total) by mouth at bedtime as needed for sleep. 30 tablet 0   No current facility-administered medications for this visit.    ROS: Review of Systems  Objective:  Psychiatric Specialty Exam: There were no vitals taken for this visit.There is no height or weight on file to calculate BMI.  General Appearance: Casual  Eye Contact:  Good  Speech:  Normal Rate  Volume:  Normal  Mood:  Euthymic  Affect:  Appropriate and Congruent  Thought Process:  Coherent, Goal Directed, and Linear  Orientation:  Full (Time, Place, and Person)  Thought Content: WDL   Suicidal Thoughts:  No  Homicidal Thoughts:  No  Memory:  Recent;   Good Remote;   Good  Judgment:  Good  Insight:  Good  Psychomotor Activity:  Normal  Concentration:  Concentration: Good and Attention Span: Good  Recall:  Good  Fund of Knowledge: Good  Language: Good  Akathisia:  Negative    AIMS (if indicated): not done  Assets:  Communication Skills Desire for Improvement Financial Resources/Insurance Housing Intimacy Leisure Time Physical Health Resilience  Social Support Talents/Skills Transportation  ADL's:  Intact  Cognition: WNL  Sleep:  Good   PE: General: well-appearing; no acute distress  Pulm: no increased work of breathing on room air  Strength & Muscle Tone: within normal limits Neuro: no focal neurological deficits observed  Gait & Station: normal  Metabolic Disorder Labs: Lab Results  Component Value Date   HGBA1C 5.4 09/28/2021   MPG 108 09/28/2021   No results found for: "PROLACTIN" Lab Results  Component Value Date   CHOL 155 09/28/2021   TRIG 30 09/28/2021   HDL 60 09/28/2021   CHOLHDL 2.6 09/28/2021   VLDL 6 09/28/2021   LDLCALC 89 09/28/2021   Lab Results  Component Value Date   TSH 0.700 09/28/2021   TSH 0.982 03/23/2021    Therapeutic Level Labs: No results found for: "LITHIUM" No results  found for: "VALPROATE" No results found for: "CBMZ"  Screenings: AUDIT    Flowsheet Row Counselor from 06/03/2020 in [facility]  Alcohol Use Disorder Identification Test Final Score (AUDIT) 4      GAD-7    Flowsheet Row Counselor from 06/08/2022 in [facility] Counselor from 04/26/2022 in [facility] Counselor from 04/06/2022 in [facility] Video Visit from 05/03/2021 in [facility] Office Visit from 03/23/2021 in [facility]  Total GAD-7 Score 6 10 14 15 20       PHQ2-9    Flowsheet Row Counselor from 06/08/2022 in [facility] Counselor from 04/26/2022 in [facility] Counselor from 04/06/2022 in [facility] ED from 09/28/2021 in [facility] Counselor from 06/08/2021 in [facility]  PHQ-2 Total Score 2 2 5 4 5   PHQ-9 Total Score 8 11 15 11 18       Flowsheet Row Counselor from 06/08/2022 in [facility] Office Visit from 04/26/2022 in [facility] Counselor from 04/06/2022 in [facility]  C-SSRS RISK CATEGORY Low Risk Low Risk Error: Question 1 not populated       Collaboration of Care: Collaboration of Care:   Patient/Guardian was advised Release of Information must be obtained prior to any record release in order to collaborate their care with an outside provider. Patient/Guardian was advised if they have not already done so to contact the registration department to sign all necessary forms in order for [facility] to release information regarding their care.   Consent: Patient/Guardian gives verbal consent for treatment and assignment of benefits for services provided during this visit. Patient/Guardian expressed understanding  and agreed to proceed.   A total of 20 minutes was spent involved in face to face clinical care, chart review, documentation.   06/06/2022, MD 07/13/2022, 5:12 PM

## 2022-07-12 ENCOUNTER — Ambulatory Visit (INDEPENDENT_AMBULATORY_CARE_PROVIDER_SITE_OTHER): Payer: No Payment, Other | Admitting: Licensed Clinical Social Worker

## 2022-07-12 DIAGNOSIS — F331 Major depressive disorder, recurrent, moderate: Secondary | ICD-10-CM | POA: Diagnosis not present

## 2022-07-12 DIAGNOSIS — F411 Generalized anxiety disorder: Secondary | ICD-10-CM

## 2022-07-12 NOTE — Progress Notes (Addendum)
THERAPIST PROGRESS NOTE  Virtual Visit via Video Note  I connected with Adonis Huguenin on 07/12/22 at  3:00 PM EST by a video enabled telemedicine application and verified that I am speaking with the correct person using two identifiers.  Location: Patient: [facility]  Provider: Providers Home   I discussed the limitations of evaluation and management by telemedicine and the availability of in person appointments. The patient expressed understanding and agreed to proceed.      I discussed the assessment and treatment plan with the patient. The patient was provided an opportunity to ask questions and all were answered. The patient agreed with the plan and demonstrated an understanding of the instructions.   The patient was advised to call back or seek an in-person evaluation if the symptoms worsen or if the condition fails to improve as anticipated.  I provided 40 minutes of non-face-to-face time during this encounter.   Dory Horn, LCSW   Participation Level: Active  Behavioral Response: CasualAlertAnxious and Depressed  Type of Therapy: Individual Therapy  Treatment Goals addressed: [name] WILL PARTICIPATE IN AT LEAST 80% OF SCHEDULED INDIVIDUAL PSYCHOTHERAPY SESSIONS   ProgressTowards Goals: Progressing  Interventions: CBT, Motivational Interviewing, and Supportive  Summary: HARRELL NIEHOFF is a 31 y.o. male who presents with depressed and anxious mood\affect.  Patient was pleasant, cooperative and maintained good eye contact.  He engaged well in therapy session was dressed casually.  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  Patient reports that there has been some conflict with his mother and his significant other Sonny.  Alex,Sonny, and his mother all live together and patient's mother's house.  Patient's mother has asked about a date when partner will be moving out.  This is because stress and tension in the  household as patient's partner just started working a new full-time job and has not been able to save enough.  Other stressors are patient conflict with his father who last spoke with patient several months ago where the conversation ended "you are not a man that I am".  Patient reports after the conversation they have not talked.  Patient reports that his mother has talked to his father and father has just stated indications that he would like to talk with Cristie Hem.  Suicidal/Homicidal: Nowithout intent/plan  Therapist Response:     Intervention/Plan: LCSW supportive therapy for praise and encouragement.  LCSW spoke with patient about communication styles to help de-escalate situations such as conflict between partner and mom as well as father and himself.  LCSW spoke about practicing what he is going to stay in each situation.  Utilizing bullet points, nonverbal communication, and tone of voice.  LCSW used CBT to for cognitive restructuring.  LCSW used motivational interviewing for reflective listening, positive affirmations, and open-ended questions.  Plan: Return again in 3 weeks.  Diagnosis: Moderate episode of recurrent major depressive disorder (HCC)  GAD (generalized anxiety disorder)  Collaboration of Care: Other None today   Patient/Guardian was advised Release of Information must be obtained prior to any record release in order to collaborate their care with an outside provider. Patient/Guardian was advised if they have not already done so to contact the registration department to sign all necessary forms in order for [place] to release information regarding their care.   Consent: Patient/Guardian gives verbal consent for treatment and assignment of benefits for services provided during this visit. Patient/Guardian expressed understanding and agreed to proceed.   Dory Horn, LCSW 07/12/2022

## 2022-07-26 ENCOUNTER — Ambulatory Visit (INDEPENDENT_AMBULATORY_CARE_PROVIDER_SITE_OTHER): Payer: No Payment, Other | Admitting: Licensed Clinical Social Worker

## 2022-07-26 DIAGNOSIS — F331 Major depressive disorder, recurrent, moderate: Secondary | ICD-10-CM | POA: Diagnosis not present

## 2022-07-26 DIAGNOSIS — F411 Generalized anxiety disorder: Secondary | ICD-10-CM

## 2022-07-26 NOTE — Plan of Care (Signed)
  Problem: Depression CCP Problem  1 MDD  Goal: STG: Chi WILL PARTICIPATE IN AT LEAST 80% OF SCHEDULED INDIVIDUAL PSYCHOTHERAPY SESSIONS Outcome: Progressing Goal: STG: Scott Gordon WILL COMPLETE AT LEAST 80% OF ASSIGNED HOMEWORK Outcome: Progressing Goal:  Journal 2 x weekly  Outcome: Progressing Goal:  No "No call no shows" in 1 year time frame  Outcome: Progressing   Problem: Anxiety Disorder CCP Problem  1 GAD  Goal: STG: Patient will complete at least 80% of assigned homework Outcome: Progressing Goal: STG: Patient will practice problem solving skills 3 times per week for the next 4 weeks Outcome: Progressing   Problem: Anxiety Disorder CCP Problem  1 GAD  Goal: STG: Patient will attend at least 80% of scheduled group psychotherapy sessions Outcome: Not Met (add Reason) Note: Pt does not want to do group therapy at this time    Problem: Depression CCP Problem  1 MDD  Intervention: WORK WITH Scott Gordon TO TRACK SYMPTOMS, TRIGGERS AND/OR SKILL USE THROUGH A MOOD CHART, DIARY CARD, OR JOURNAL Intervention: PROVIDE Scott Gordon WITH EDUCATIONAL INFORMATION AND READING MATERIAL ON DISSOCIATION, ITS CAUSES, AND SYMPTOMS   Problem: Anxiety Disorder CCP Problem  1 GAD  Intervention: Encourage patient to take psychotropic medication as prescribed Intervention: Work with patient to track symptoms, triggers and/or skill use through a mood chart, diary card, or journal Intervention: Perform psychoeducation regarding anxiety disorders

## 2022-07-26 NOTE — Progress Notes (Signed)
   THERAPIST PROGRESS NOTE  Session Time: 29  Participation Level: Active  Behavioral Response: CasualAlertAnxious and Depressed  Type of Therapy: Individual Therapy  Treatment Goals addressed:  Problem: Depression CCP Problem  1 MDD  Goal: STG: Scott Gordon WILL PARTICIPATE IN AT LEAST 80% OF SCHEDULED INDIVIDUAL PSYCHOTHERAPY SESSIONS Outcome: Progressing  Interventions: Motivational Interviewing, Strength-based, and Supportive  Summary: Scott Gordon is a 31 y.o. male who presents with depressed and anxious mood\affect.  Patient was pleasant, cooperative, maintained good eye contact.  Scott Gordon was dressed casually and engaged well in therapy session.  Patient was alert and oriented x5.  Patient reports primary stressors as relationship, family conflict, medication management, and financials.  Patient reports that he has not been able to spend quality time with one of his partners due to her increased hours at work.  Patient reports that he has communicated this information to his partner and they are trying to designate certain days and timeframes to be able to spend time together.  Patient also reports that this partner lives with him at his mother's house and patient's mother has given the partner until January 1 to move out.  This will be a struggle as patient reports his partner does not have a vehicle and finding a place to live in 5 weeks will be challenging.  Scott Gordon reports some good news that his partner has about $4000 in savings and is looking into a vehicle at this time.   Other stressors for patient are medication management as his dosages for medications were increased 3 weeks ago.  Patient reported adverse effects and was placed back on his old dosages.  Patient reports that the medication was not working at the old dosages and new medications will have to be considered.  Patient does report that he had a job interview last week and was hoping to hear back from them about  a second interview or a job offer, but he has not heard back from them at this time.  Suicidal/Homicidal: Nowithout intent/plan  Therapist Response:     Intervention/Plan: LCSW utilized motivational interviewing for open-ended questions, reflective listening, and positive affirmations.  LCSW utilized solution focused therapy advising patient to track symptoms with current medication regiment to be able to present to doctor and nursing staff at next visit.  LCSW utilized psychoanalytic therapy for patient to express thoughts, feelings, and emotions in session.  LCSW educated patient on communication techniques such as nonverbal's for eye contact and hand movement.  LCSW used supportive therapy for praise and encouragement.  Plan: Return again in 3 weeks.  Diagnosis: Moderate episode of recurrent major depressive disorder (HCC)  GAD (generalized anxiety disorder)  Collaboration of Care: Other None today   Patient/Guardian was advised Release of Information must be obtained prior to any record release in order to collaborate their care with an outside provider. Patient/Guardian was advised if they have not already done so to contact the registration department to sign all necessary forms in order for [place] to release information regarding their care.   Consent: Patient/Guardian gives verbal consent for treatment and assignment of benefits for services provided during this visit. Patient/Guardian expressed understanding and agreed to proceed.   Weber Cooks, LCSW 07/26/2022

## 2022-08-02 ENCOUNTER — Other Ambulatory Visit: Payer: Self-pay

## 2022-08-11 ENCOUNTER — Ambulatory Visit (INDEPENDENT_AMBULATORY_CARE_PROVIDER_SITE_OTHER): Payer: No Payment, Other | Admitting: Student

## 2022-08-11 ENCOUNTER — Other Ambulatory Visit: Payer: Self-pay

## 2022-08-11 DIAGNOSIS — N529 Male erectile dysfunction, unspecified: Secondary | ICD-10-CM

## 2022-08-11 DIAGNOSIS — F17203 Nicotine dependence unspecified, with withdrawal: Secondary | ICD-10-CM | POA: Diagnosis not present

## 2022-08-11 DIAGNOSIS — F1211 Cannabis abuse, in remission: Secondary | ICD-10-CM

## 2022-08-11 DIAGNOSIS — F411 Generalized anxiety disorder: Secondary | ICD-10-CM

## 2022-08-11 DIAGNOSIS — F331 Major depressive disorder, recurrent, moderate: Secondary | ICD-10-CM

## 2022-08-11 MED ORDER — NICOTINE POLACRILEX 2 MG MT GUM
2.0000 mg | CHEWING_GUM | OROMUCOSAL | 1 refills | Status: DC | PRN
  Filled 2022-08-11: qty 100, 50d supply, fill #0

## 2022-08-11 MED ORDER — GABAPENTIN 100 MG PO CAPS
200.0000 mg | ORAL_CAPSULE | Freq: Three times a day (TID) | ORAL | 1 refills | Status: DC
  Filled 2022-08-11: qty 180, 30d supply, fill #0
  Filled ????-??-??: fill #0

## 2022-08-11 MED ORDER — DULOXETINE HCL 30 MG PO CPEP: 30.0000 mg | ORAL_CAPSULE | Freq: Every day | ORAL | Status: DC

## 2022-08-11 MED ORDER — NICOTINE 21 MG/24HR TD PT24
21.0000 mg | MEDICATED_PATCH | Freq: Every day | TRANSDERMAL | 1 refills | Status: DC
  Filled 2022-08-11: qty 28, 28d supply, fill #0

## 2022-08-11 MED ORDER — VENLAFAXINE HCL ER 37.5 MG PO CP24
ORAL_CAPSULE | ORAL | 0 refills | Status: DC
  Filled 2022-08-11: qty 77, 42d supply, fill #0

## 2022-08-11 NOTE — Progress Notes (Signed)
[facility] MD Outpatient Progress Note  08/12/2022 10:10 PM Scott Gordon  MRN:  [id number]  Assessment:  Scott Gordon presents for follow-up evaluation in-person. Today, 08/12/22, patient reports that his depressive symptoms are still persistent and still has minimal symptoms of overflow incontinence despite current dose of cymbalta. Given inability to tolerate higher doses of duloxetine, we will cross taper Scott Gordon to venlafaxine to minimize risk for incontinence as venlafaxine is not known to cause urinary incontinence. His anxiety is currently well controlled as well as his nicotine use disorder although he notes his nicotine craving has recently worsened because of his recent depressive symptoms. Continues to endorse passive suicidal ideation without plan or intent and is able to contract for safety. Denies HI/AVH.   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     Plan: # Major Depressive Disorder- recurrent episode, severe w/o psychotic features Past medication trials:  wellbutrin (migraine, memory loss), fluoxetine  Status of problem: Improving Interventions: -- Decrease duloxetine to 30 mg for 7 days then STOP -- START venlafaxine 37.5 mg daily for 7 days then INCREASE to 75 mg daily for depressive symptoms -- Discontinued trazodone 50 mg qhs prn for insomnia -- Recommend continuing psychotherapy for management of present stressors   # Generalized Anxiety Disorder Past medication trials: hydroxyzine (ineffective) Status of problem: Improving Interventions: -- Continue gabapentin to 200 mg three times daily for anxiety symptoms  --Could consider decreasing to  decrease daytime fatigue next visit if anxiety still well-controlled and lower dose of gabapentin -- Venlafaxine as above   # Nicotine Use Disorder, improving -Continue nicotine patch -Continue nicotine gum PRN   #Reported History of ADHD Unclear if meets criteria versus inattention and hyperactivity secondary to anxiety Past medication trials: Adderall (urinary retention), strattera (urinary retention) Status of problem: Stable Interventions -Continue to monitor symptoms  #Health Maintenance -Ambulatory referral to IM clinic to establish with PCP and concern for erectile dysfucntion  Patient was given contact information for behavioral health clinic and was instructed to call 911 for emergencies.   Subjective:  Chief Complaint:  Chief Complaint  Patient presents with   Depression    Interval History:  Scott Gordon reports that his depressive symptoms are still present and have been difficult to manage for past month as he still is having difficulty with finding employment and one of the jobs he had been eager to apply for had rejected him. He had 2 weeks of increased depressive symptoms last month but has since been able to recover from this setback. He is future oriented and goal directed. 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 He is trying behavioral modification including exercising with friend 2 timers per week in hopes to decrease his need for sleep. He does note his nicotine craving has increased in setting of recent worsening depressive symptoms. He does continue to note minimal overflow incontinence but does report it is better than when he was taking duloxetine 90 mg.  He does note that his anxiety is still well controlled for the past month with current dose of gabapentin. He does report increased fatigue that he attributes to his depression. He has been drinking up to 200 mg of caffeine to better manage his fatigue. He also notes a 10 lb weight gain  in the past month. He states this may be due to his increased inertness and reduced anxiety which has allowed him to eat more. This is a source of his desire to exercise at this time.  Visit Diagnosis:    ICD-10-CM   1. MDD (major depressive disorder), recurrent episode, moderate (HCC)  F33.1 venlafaxine XR (EFFEXOR XR) 37.5 MG 24 hr capsule    2. GAD (generalized anxiety disorder)  F41.1 gabapentin (NEURONTIN) 100 MG capsule    DULoxetine (CYMBALTA) 30 MG capsule    3. Nicotine dependence with withdrawal, unspecified nicotine product type  F17.203 nicotine (NICODERM CQ) 21 mg/24hr patch    nicotine polacrilex (NICORETTE) 2 MG gum    Ambulatory referral to Internal Medicine    4. Erectile dysfunction, unspecified erectile dysfunction type  N52.9 Ambulatory referral to Internal Medicine    5. Moderate episode of recurrent major depressive disorder (HCC)  F33.1     6. Cannabis use disorder, mild, in early remission  F12.11       Past Medical History:  Past Medical History:  Diagnosis Date   Anxiety    Depression     Past Surgical History:  Procedure Laterality Date   WISDOM TOOTH EXTRACTION      Family Psychiatric History: Notes that his family has mental health conditions but they are not diagnosed      Family History:  Family History  Family history unknown: Yes    Social History:  Social History   Socioeconomic History   Marital status: Significant Other    Spouse name: Not on file   Number of children: Not on file   Years of education: Not on file   Highest education level: Not on file  Occupational History   Not on file  Tobacco Use   Smoking status: Never   Smokeless tobacco: Never  Vaping Use   Vaping Use: Every day   Substances: Nicotine, Flavoring  Substance and Sexual Activity   Alcohol use: Not Currently   Drug use: Yes    Types: Marijuana   Sexual activity: Yes    Birth control/protection: Condom  Other Topics Concern   Not on file  Social  History Narrative   Not on file   Social Determinants of Health   Financial Resource Strain: Low Risk  (04/06/2022)   Overall Financial Resource Strain (CARDIA)    Difficulty of Paying Living Expenses: Not hard at all  Food Insecurity: No Food Insecurity (06/03/2020)   Hunger Vital Sign    Worried About Running Out of Food in the Last Year: Never true    [place] in the Last Year: Never true  Transportation Needs: No Transportation Needs (06/03/2020)   PRAPARE - Hydrologist (Medical): No    Lack of Transportation (Non-Medical): No  Physical Activity: Sufficiently Active (04/06/2022)   Exercise Vital Sign    Days of Exercise per Week: 3 days    Minutes of Exercise per Session: 90 min  Stress: Stress Concern Present (04/06/2022)   [place]    Feeling of Stress : Very much  Social Connections: Socially Isolated (04/06/2022)   Social Connection and Isolation Panel [NHANES]    Frequency of Communication with Friends and Family: Never    Frequency of Social Gatherings with Friends and Family: More than three times a week    Attends Religious Services: Never    Marine scientist or Organizations: No  Attends Archivist Meetings: Never    Marital Status: Never married    Allergies:  Allergies  Allergen Reactions   Pineapple Hives    Current Medications: Current Outpatient Medications  Medication Sig Dispense Refill   venlafaxine XR (EFFEXOR XR) 37.5 MG 24 hr capsule Take 1 capsule (37.5 mg total) by mouth daily with breakfast for 7 days, THEN 2 capsules (75 mg total) daily with breakfast. 77 capsule 0   DULoxetine (CYMBALTA) 30 MG capsule Take 1 capsule (30 mg total) by mouth at bedtime for 7 days. 7 capsule    gabapentin (NEURONTIN) 100 MG capsule Take 2 capsules (200 mg total) by mouth 3 (three) times daily. 180 capsule 1   nicotine (NICODERM CQ) 21 mg/24hr patch Place  1 patch (21 mg total) onto the skin daily. 28 patch 1   nicotine polacrilex (NICORETTE) 2 MG gum Chew 1 gum (2 mg total) by mouth as needed for smoking cessation. 100 tablet 1   No current facility-administered medications for this visit.    ROS: Review of Systems  Constitutional:  Positive for appetite change, fatigue and unexpected weight change. Negative for activity change.    Objective:  Psychiatric Specialty Exam: There were no vitals taken for this visit.There is no height or weight on file to calculate BMI.  General Appearance: Well Groomed  Eye Contact:  Fair  Speech:  Clear and Coherent and Normal Rate  Volume:  Normal  Mood:  Depressed  Affect:  Congruent  Thought Process:  Coherent, Goal Directed, and Linear  Orientation:  Full (Time, Place, and Person)  Thought Content: Logical   Suicidal Thoughts:  Yes.  without intent/plan  Homicidal Thoughts:  No  Memory:  Negative  Judgment:  Good  Insight:  Good  Psychomotor Activity:  Normal  Concentration:  Concentration: Fair and Attention Span: Fair              Assets:  Communication Skills Desire for Improvement Financial Resources/Insurance Housing Intimacy Leisure Time [place] Talents/Skills Transportation  ADL's:  Intact  Cognition: WNL  Sleep:  Fair   PE: General: well-appearing; no acute distress  Pulm: no increased work of breathing on room air  Strength & Muscle Tone: within normal limits Neuro: no focal neurological deficits observed  Gait & Station: normal  Metabolic Disorder Labs: Lab Results  Component Value Date   HGBA1C 5.4 09/28/2021   MPG 108 09/28/2021   No results found for: "PROLACTIN" Lab Results  Component Value Date   CHOL 155 09/28/2021   TRIG 30 09/28/2021   HDL 60 09/28/2021   CHOLHDL 2.6 09/28/2021   VLDL 6 09/28/2021   LDLCALC 89 09/28/2021   Lab Results  Component Value Date   TSH 0.700 09/28/2021   TSH 0.982 03/23/2021     Therapeutic Level Labs: No results found for: "LITHIUM" No results found for: "VALPROATE" No results found for: "CBMZ"  Screenings: AUDIT    Flowsheet Row Counselor from 06/03/2020 in [facility]  Alcohol Use Disorder Identification Test Final Score (AUDIT) 4      GAD-7    Flowsheet Row Counselor from 06/08/2022 in [facility] Counselor from 04/26/2022 in [facility] Counselor from 04/06/2022 in [facility] Video Visit from 05/03/2021 in [facility] Office Visit from 03/23/2021 in [facility]  Total GAD-7 Score 6 10 14 15 20       PHQ2-9  Flowsheet Row Counselor from 06/08/2022 in [facility] Counselor from 04/26/2022 in [facility] Counselor from 04/06/2022 in [facility] ED from 09/28/2021 in [facility] Counselor from 06/08/2021 in [facility]  PHQ-2 Total Score 2 2 5 4 5   PHQ-9 Total Score 8 11 15 11 18       Flowsheet Row Counselor from 06/08/2022 in [facility] Office Visit from 04/26/2022 in [facility] Counselor from 04/06/2022 in [facility]  C-SSRS RISK CATEGORY Low Risk Low Risk Error: Question 1 not populated       Collaboration of Care: Collaboration of Care:   Patient/Guardian was advised Release of Information must be obtained prior to any record release in order to collaborate their care with an outside provider. Patient/Guardian was advised if they have not already done so to contact the registration department to sign all necessary forms in order for 06/06/2022 to release information regarding their care.   Consent: Patient/Guardian gives verbal consent for treatment and assignment of benefits for  services provided during this visit. Patient/Guardian expressed understanding and agreed to proceed.   A total of 30 minutes was spent involved in face to face clinical care, chart review, and documentation.   [facility], MD 08/12/2022, 10:10 PM

## 2022-08-12 ENCOUNTER — Other Ambulatory Visit: Payer: Self-pay

## 2022-08-17 ENCOUNTER — Ambulatory Visit (INDEPENDENT_AMBULATORY_CARE_PROVIDER_SITE_OTHER): Payer: Medicaid Other | Admitting: Licensed Clinical Social Worker

## 2022-08-17 DIAGNOSIS — F331 Major depressive disorder, recurrent, moderate: Secondary | ICD-10-CM | POA: Diagnosis not present

## 2022-08-17 DIAGNOSIS — F411 Generalized anxiety disorder: Secondary | ICD-10-CM

## 2022-08-17 NOTE — Progress Notes (Addendum)
THERAPIST PROGRESS NOTE  Session Time: 56   Participation Level: Active  Behavioral Response: CasualAlertAnxious and Depressed  Type of Therapy: Individual Therapy  Treatment Goals addressed:  Active     Anxiety Disorder CCP Problem  1 GAD      LTG: Patient will score less than 5 on the Generalized Anxiety Disorder 7 Scale (GAD-7) (Not Progressing)     Start:  04/06/22    Expected End:  10/05/22         STG: Patient will attend at least 80% of scheduled group psychotherapy sessions (Not Met (add Reason))     Start:  04/06/22    Expected End:  10/05/22       Goal Note     Pt is not admitted to any group therapy at this time          STG: Patient will complete at least 80% of assigned homework (Progressing)     Start:  04/06/22    Expected End:  10/05/22         STG: Patient will practice problem solving skills 3 times per week for the next 4 weeks (Progressing)     Start:  04/06/22    Expected End:  10/05/22           Depression CCP Problem  1 MDD       Identify three triggers for depression  (Progressing)     Start:  04/06/22    Expected End:  10/05/22         create 3 future goals  (Progressing)     Start:  04/06/22    Expected End:  10/05/22         Decrease PHQ-9 below 10  (Not Progressing)     Start:  04/06/22    Expected End:  10/05/22         STG: Scott Gordon WILL PARTICIPATE IN AT LEAST 80% OF SCHEDULED INDIVIDUAL PSYCHOTHERAPY SESSIONS (Progressing)     Start:  04/06/22    Expected End:  10/05/22         STG: Scott Gordon WILL COMPLETE AT LEAST 80% OF ASSIGNED HOMEWORK (Progressing)     Start:  04/06/22    Expected End:  10/05/22          Journal 2 x weekly  (Progressing)     Start:  04/06/22    Expected End:  10/05/22             No "No call no shows" in 1 year time frame  (Progressing)     Start:  04/06/22    Expected End:  10/05/22               ProgressTowards Goals: Progressing  Interventions: CBT, Motivational  Interviewing, Strength-based, and Supportive  Summary: Scott Gordon is a 31 y.o. male who presents with depressed and anxious mood\affect.  Patient was pleasant, cooperative, maintained good eye contact.  He engaged well in therapy session was dressed casually.  Scott Gordon was alert and oriented x 5.  Patient comes in today stating "we can start out with some good news.  I am in the gym at best 5 days a week and it works 3 days a week."  Patient goes on to say "I am doing my best to focus on what I can do rather than what I cannot do.  Scott Gordon reports today that he has been working to prioritize himself, stay active in the gym, finding time for leisure activities and prioritizing himself.  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  Patient reports primary stressors as relationships, job, and Pension scheme manager.  Patient reports that he has been looking for a job for multiple months now without any success.  Patient reports that he shifted his focus on to looking into getting his certification for IT which is Gordon above.  Patient reports stressors for relationship has with his partner that he is living with "[name]".  Patient reports poor communication and stress, tension, and worry within the relationship.  Patient reports an incident with partner in regards to her towel.  Patient reports that both have green towels. that have similar colors.  Scott Gordon states they have had the towels for many years and Scott Gordon that his mother had placed one of the green towels on the floor.  When Scott Gordon's partner got home she noticed the towel on the floor and Gordon "why is my towel on the floor?".  Scott Gordon explained the situation and partner responded by stating "I guess I will by towels".  Scott Gordon that his partner then proceeded to leave the room.  Patient reports that he attempted to talk with his partner about this and his partner had Gordon that  this had stems from issues in her childhood with her mother feeling under minded and under valued.  Suicidal/Homicidal: Nowithout intent/plan  Therapist Response:     Intervention/Plan: LCSW notes an increase in activity going to the gym at least 3 days to 5 days out of the week.  LCSW supportive therapy for praise and encouragement in regards to his activity level.  LCSW used empowerment for patient in regards to prioritizing himself.  LCSW used positive affirmations and reflective listening in regards to his IT certifications and job stress.  LCSW educated patient on communication techniques such as nonverbal's and reflective listening.  Plan: Return again in 3 weeks.  Diagnosis: Moderate episode of recurrent major depressive disorder (HCC)  GAD (generalized anxiety disorder)  Collaboration of Care: Other None today   Patient/Guardian was advised Release of Information must be obtained prior to any record release in order to collaborate their care with an outside provider. Patient/Guardian was advised if they have not already done so to contact the registration department to sign all necessary forms in order for [place] to release information regarding their care.   Consent: Patient/Guardian gives verbal consent for treatment and assignment of benefits for services provided during this visit. Patient/Guardian expressed understanding and agreed to proceed.   Scott Horn, LCSW 08/17/2022

## 2022-08-18 ENCOUNTER — Other Ambulatory Visit: Payer: Self-pay

## 2022-09-07 ENCOUNTER — Ambulatory Visit (INDEPENDENT_AMBULATORY_CARE_PROVIDER_SITE_OTHER): Payer: No Payment, Other | Admitting: Licensed Clinical Social Worker

## 2022-09-07 ENCOUNTER — Other Ambulatory Visit (HOSPITAL_COMMUNITY): Payer: Self-pay

## 2022-09-07 DIAGNOSIS — F331 Major depressive disorder, recurrent, moderate: Secondary | ICD-10-CM

## 2022-09-07 DIAGNOSIS — F411 Generalized anxiety disorder: Secondary | ICD-10-CM

## 2022-09-07 NOTE — Progress Notes (Addendum)
THERAPIST PROGRESS NOTE  Session Time: 89   Participation Level: Active  Behavioral Response: CasualAlertAnxious and Depressed  Type of Therapy: Individual Therapy  Treatment Goals addressed:  Active     Anxiety Disorder CCP Problem  1 GAD      LTG: Patient will score less than 5 on the Generalized Anxiety Disorder 7 Scale (GAD-7) (Not Progressing)     Start:  04/06/22    Expected End:  10/05/22       Goal Note     Increased by two points since Oct 2023          STG: Patient will attend at least 80% of scheduled group psychotherapy sessions (Progressing)     Start:  04/06/22    Expected End:  10/05/22       Goal Note     100 percent attendance in last 5 months          STG: Patient will complete at least 80% of assigned homework (Progressing)     Start:  04/06/22    Expected End:  10/05/22         STG: Patient will practice problem solving skills 3 times per week for the next 4 weeks (Progressing)     Start:  04/06/22    Expected End:  10/05/22           Depression CCP Problem  1 MDD       Identify three triggers for depression  (Progressing)     Start:  04/06/22    Expected End:  10/05/22       Goal Note     Not having employment           create 3 future goals  (Progressing)     Start:  04/06/22    Expected End:  10/05/22       Goal Note     Obtain 40 hours of employment  2. Graduate with IT certificate          Decrease PHQ-9 below 10  (Completed/Met)     Start:  04/06/22    Expected End:  10/05/22    Resolved:  09/07/22    Goal Note     Below a 10 x 2 in last 3 months          STG: Scott Gordon WILL PARTICIPATE IN AT LEAST 80% OF SCHEDULED INDIVIDUAL PSYCHOTHERAPY SESSIONS (Progressing)     Start:  04/06/22    Expected End:  10/05/22       Goal Note     100 percent attendance in last 5 months          STG: Scott Gordon WILL COMPLETE AT LEAST 80% OF ASSIGNED HOMEWORK (Progressing)     Start:  04/06/22    Expected End:   10/05/22          Journal 2 x weekly  (Progressing)     Start:  04/06/22    Expected End:  10/05/22             No "No call no shows" in 1 year time frame  (Progressing)     Start:  04/06/22    Expected End:  10/05/22          Goal Note     0 no shows in last 5 months               Interventions: CBT, Motivational Interviewing, and Supportive  Summary: Scott Gordon is a 32 y.o. male who  presents with depressed and anxious mood\affect.  Patient was pleasant, cooperative, maintained good eye contact.  He engaged well in therapy session was dressed casually.  Scott Gordon was alert and oriented x 5.  Patient reports primary stressors as financial and work.  Patient states that he is still been unable to find employment.  He reports that he applied for a [employer] that was 1 mile from his house and even talk to the Health and safety inspector but has not heard back for an interview.  Scott Gordon reports that he feels discouraged.  Patient does report good news with better communication between his 2 partners  "Scott Gordon" and "Scott Gordon".  Patient reports that they have started on dungeons and dragons group that they are all involved in.  Patient reports still studying and taking classes for IT certificate which she hopes to take first part of testing before March.  Patient reports that he is also set goals in the gym to gain weight to 175.  Patient reports that he has gained a gym partner which she was friends with prior to going to the gym.  Scott Gordon reports that this helps hold him accountable.  Patient identifies anxiety for tension, worry, and restlessness.   Suicidal/Homicidal: Nowithout intent/plan  Therapist Response:     Intervention/Plan: LCSW administered GAD-7.  LCSW administered a PHQ-9.  LCSW notes goals met for PHQ-9 for goal below a 10.   LCSW notes an increase in anxiety from a 6 to an 8 in the last 3 months.  Patient also reports that he has been journaling daily and even brings  his journal to sessions.  LCSW used interventions for supportive therapy for praise and encouragement.  Motivational interviewing for open-ended questions, reflective listening and positive affirmations.    Plan: Return again in 4 weeks.  Diagnosis: Moderate episode of recurrent major depressive disorder (HCC)  GAD (generalized anxiety disorder)  Collaboration of Care: Other None today  Patient/Guardian was advised Release of Information must be obtained prior to any record release in order to collaborate their care with an outside provider. Patient/Guardian was advised if they have not already done so to contact the registration department to sign all necessary forms in order for [place] to release information regarding their care.   Consent: Patient/Guardian gives verbal consent for treatment and assignment of benefits for services provided during this visit. Patient/Guardian expressed understanding and agreed to proceed.   Dory Horn, LCSW 09/07/2022

## 2022-09-08 ENCOUNTER — Encounter (HOSPITAL_COMMUNITY): Payer: Medicaid Other | Admitting: Student

## 2022-09-08 NOTE — Progress Notes (Deleted)
Scott Gordon Outpatient Progress Note  09/08/2022 11:55 AM Scott Gordon  MRN:  [id number]  Assessment:  Scott Gordon presents for follow-up evaluation in-person. 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   Plan:  # Major Depressive Disorder- recurrent episode, severe w/o psychotic features Past medication trials:  wellbutrin (migraine, memory loss), fluoxetine  Status of problem: Improving Interventions: -- Decrease duloxetine to 30 mg for 7 days then STOP -- START venlafaxine 37.5 mg daily for 7 days then INCREASE to 75 mg daily for depressive symptoms -- Discontinued trazodone 50 mg qhs prn for insomnia -- Recommend continuing psychotherapy for management of present stressors   # Generalized Anxiety Disorder Past medication trials: hydroxyzine (ineffective) Status of problem: Improving Interventions: -- Continue gabapentin to 200 mg three times daily for anxiety symptoms             --Could consider decreasing to decrease daytime fatigue next visit if anxiety still well-controlled and lower dose of gabapentin -- Venlafaxine as above   # Nicotine Use Disorder, improving -Continue nicotine patch -Continue nicotine gum PRN   #Reported History of ADHD Unclear if meets criteria versus inattention and hyperactivity secondary to anxiety Past medication trials: Adderall (urinary retention), strattera (urinary retention) Status of problem: Stable Interventions -Continue to monitor symptoms   #Health Maintenance -Ambulatory referral to IM clinic to establish with PCP and concern for erectile dysfucntion  Patient was given  contact information for behavioral health clinic and was instructed to call 911 for emergencies.   Subjective:  Chief Complaint: No chief complaint on file.   Interval History: ***  Visit Diagnosis:    ICD-10-CM   1. Moderate episode of recurrent major depressive disorder (HCC)  F33.1     2. GAD (generalized anxiety disorder)  F41.1       Past Psychiatric History: ***  Past Medical History:  Past Medical History:  Diagnosis Date   Anxiety    Depression     Past Surgical History:  Procedure Laterality Date   WISDOM TOOTH EXTRACTION      Family Psychiatric History: ***  Family History:  Family History  Family history unknown: Yes    Social History:  Social History   Socioeconomic History   Marital status: Significant Other    Spouse name: Not on file   Number of children: Not on file   Years of education: Not on file   Highest education level: Not on file  Occupational History   Not on file  Tobacco Use   Smoking status: Never   Smokeless tobacco: Never  Vaping Use   Vaping Use: Every day   Substances: Nicotine, Flavoring  Substance and Sexual Activity   Alcohol use: Not Currently   Drug use: Yes    Types: Marijuana   Sexual activity: Yes    Birth control/protection: Condom  Other Topics Concern   Not on file  Social History Narrative   Not on file   Social Determinants of Health   Financial Resource Strain: Low Risk  (04/06/2022)   Overall Financial Resource Strain (CARDIA)    Difficulty of Paying Living Expenses: Not hard at all  Food Insecurity: No Food Insecurity (06/03/2020)   Hunger Vital Sign    Worried About Running Out of Food  in the Last Year: Never true    Ran Out of Food in the Last Year: Never true  Transportation Needs: No Transportation Needs (06/03/2020)   PRAPARE - Administrator, Civil Service (Medical): No    Lack of Transportation (Non-Medical): No  Physical Activity: Sufficiently Active (04/06/2022)   Exercise  Vital Sign    Days of Exercise per Week: 3 days    Minutes of Exercise per Session: 90 min  Stress: Stress Concern Present (04/06/2022)   [employer] of Occupational Health - Occupational Stress Questionnaire    Feeling of Stress : Very much  Social Connections: Socially Isolated (04/06/2022)   Social Connection and Isolation Panel [NHANES]    Frequency of Communication with Friends and Family: Never    Frequency of Social Gatherings with Friends and Family: More than three times a week    Attends Religious Services: Never    Database administrator or Organizations: No    Attends Banker Meetings: Never    Marital Status: Never married    Allergies:  Allergies  Allergen Reactions   Pineapple Hives    Current Medications: Current Outpatient Medications  Medication Sig Dispense Refill   DULoxetine (CYMBALTA) 30 MG capsule Take 1 capsule (30 mg total) by mouth at bedtime for 7 days. 7 capsule    gabapentin (NEURONTIN) 100 MG capsule Take 2 capsules (200 mg total) by mouth 3 (three) times daily. 180 capsule 1   nicotine (NICODERM CQ) 21 mg/24hr patch Place 1 patch (21 mg total) onto the skin daily. 28 patch 1   nicotine polacrilex (NICORETTE) 2 MG gum Chew 1 gum (2 mg total) by mouth as needed for smoking cessation. 100 tablet 1   venlafaxine XR (EFFEXOR XR) 37.5 MG 24 hr capsule Take 1 capsule (37.5 mg total) by mouth daily with breakfast for 7 days, THEN 2 capsules (75 mg total) daily with breakfast. 77 capsule 0   No current facility-administered medications for this visit.    ROS: Review of Systems  Objective:  Psychiatric Specialty Exam: There were no vitals taken for this visit.There is no height or weight on file to calculate BMI.  General Appearance: {Appearance:22683}  Eye Contact:  {BHH EYE CONTACT:22684}  Speech:  {Speech:22685}  Volume:  {Volume (PAA):22686}  Mood:  {BHH MOOD:22306}  Affect:  {Affect (PAA):22687}  Thought Process:  {Thought  Process (PAA):22688}  Orientation:  {BHH ORIENTATION (PAA):22689}  Thought Content: {Thought Content:22690}   Suicidal Thoughts:  {ST/HT (PAA):22692}  Homicidal Thoughts:  {ST/HT (PAA):22692}  Memory:  {BHH KYHCWC:37628}  Judgment:  {Judgement (PAA):22694}  Insight:  {Insight (PAA):22695}  Psychomotor Activity:  {Psychomotor (PAA):22696}  Concentration:  {Concentration:21399}              Assets:  {Assets (PAA):22698}  ADL's:  {BHH BTD'V:76160}  Cognition: {chl bhh cognition:304700322}  Sleep:  {BHH GOOD/FAIR/POOR:22877}   PE: General: well-appearing; no acute distress *** Pulm: no increased work of breathing on room air *** Strength & Muscle Tone: {desc; muscle tone:32375} Neuro: no focal neurological deficits observed *** Gait & Station: {PE GAIT ED VPXT:06269}  Metabolic Disorder Labs: Lab Results  Component Value Date   HGBA1C 5.4 09/28/2021   MPG 108 09/28/2021   No results found for: "PROLACTIN" Lab Results  Component Value Date   CHOL 155 09/28/2021   TRIG 30 09/28/2021   HDL 60 09/28/2021   CHOLHDL 2.6 09/28/2021   VLDL 6 09/28/2021   LDLCALC 89 09/28/2021   Lab Results  Component Value Date   TSH 0.700 09/28/2021   TSH 0.982 03/23/2021    Therapeutic Level Labs: No results found for: "LITHIUM" No results found for: "VALPROATE" No results found for: "CBMZ"  Screenings: AUDIT    Flowsheet Row Counselor from 06/03/2020 in [facility]  Alcohol Use Disorder Identification Test Final Score (AUDIT) 4      GAD-7    Flowsheet Row Counselor from 09/07/2022 in [facility] Counselor from 06/08/2022 in [facility] Counselor from 04/26/2022 in [facility] Counselor from 04/06/2022 in [facility] Video Visit from 05/03/2021 in [facility]  Total GAD-7 Score 8 6 10 14 15       PHQ2-9     Flowsheet Row Counselor from 09/07/2022 in [facility] Counselor from 06/08/2022 in [facility] Counselor from 04/26/2022 in [facility] Counselor from 04/06/2022 in [facility] from 09/28/2021 in [place]  PHQ-2 Total Score 2 2 2 5 4   PHQ-9 Total Score 7 8 11 15 11       Flowsheet Row Counselor from 06/08/2022 in [facility] Office Visit from 04/26/2022 in [facility] Counselor from 04/06/2022 in [place] Risk Error: Question 1 not populated       Collaboration of Care: Collaboration of Care:   Patient/Guardian was advised Release of Information must be obtained prior to any record release in order to collaborate their care with an outside provider. Patient/Guardian was advised if they have not already done so to contact the registration department to sign all necessary forms in order for [place] to release information regarding their care.   Consent: Patient/Guardian gives verbal consent for treatment and assignment of benefits for services provided during this visit. Patient/Guardian expressed understanding and agreed to proceed.   A total of *** minutes was spent involved in face to face clinical care, chart review, and documentation.   France Ravens, Gordon 09/08/2022, 11:55 AM

## 2022-09-12 ENCOUNTER — Other Ambulatory Visit: Payer: Self-pay

## 2022-09-12 ENCOUNTER — Telehealth (HOSPITAL_COMMUNITY): Payer: Self-pay | Admitting: Student

## 2022-09-12 DIAGNOSIS — F17203 Nicotine dependence unspecified, with withdrawal: Secondary | ICD-10-CM

## 2022-09-12 DIAGNOSIS — F331 Major depressive disorder, recurrent, moderate: Secondary | ICD-10-CM

## 2022-09-12 DIAGNOSIS — F411 Generalized anxiety disorder: Secondary | ICD-10-CM

## 2022-09-12 MED ORDER — VENLAFAXINE HCL ER 75 MG PO CP24
75.0000 mg | ORAL_CAPSULE | Freq: Every day | ORAL | 0 refills | Status: DC
  Filled 2022-09-12 – 2022-09-21 (×2): qty 30, 30d supply, fill #0

## 2022-09-12 MED ORDER — GABAPENTIN 100 MG PO CAPS
200.0000 mg | ORAL_CAPSULE | Freq: Three times a day (TID) | ORAL | 1 refills | Status: DC
  Filled 2022-09-12 – 2022-09-21 (×2): qty 180, 30d supply, fill #0

## 2022-09-12 MED ORDER — NICOTINE POLACRILEX 2 MG MT GUM
2.0000 mg | CHEWING_GUM | OROMUCOSAL | 1 refills | Status: DC | PRN
  Filled 2022-09-12: qty 110, 55d supply, fill #0
  Filled 2022-09-21: qty 100, 50d supply, fill #0

## 2022-09-12 MED ORDER — NICOTINE 21 MG/24HR TD PT24
21.0000 mg | MEDICATED_PATCH | Freq: Every day | TRANSDERMAL | 1 refills | Status: DC
  Filled 2022-09-12 – 2022-09-21 (×2): qty 28, 28d supply, fill #0

## 2022-09-12 NOTE — Telephone Encounter (Signed)
Patient missed last appointment. Refills of effexor XR, nicotine patch, nicotine gum, and gabapentin sent to preferred pharmacy.

## 2022-09-19 ENCOUNTER — Other Ambulatory Visit: Payer: Self-pay

## 2022-09-21 ENCOUNTER — Other Ambulatory Visit: Payer: Self-pay

## 2022-09-21 ENCOUNTER — Ambulatory Visit: Payer: Medicaid Other | Admitting: Student

## 2022-09-21 ENCOUNTER — Encounter: Payer: Self-pay | Admitting: Student

## 2022-09-21 VITALS — BP 110/64 | HR 93 | Temp 98.2°F | Ht 67.0 in | Wt 172.6 lb

## 2022-09-21 DIAGNOSIS — N521 Erectile dysfunction due to diseases classified elsewhere: Secondary | ICD-10-CM | POA: Diagnosis present

## 2022-09-21 DIAGNOSIS — F17213 Nicotine dependence, cigarettes, with withdrawal: Secondary | ICD-10-CM | POA: Diagnosis not present

## 2022-09-21 DIAGNOSIS — F411 Generalized anxiety disorder: Secondary | ICD-10-CM

## 2022-09-21 DIAGNOSIS — F331 Major depressive disorder, recurrent, moderate: Secondary | ICD-10-CM

## 2022-09-21 DIAGNOSIS — N529 Male erectile dysfunction, unspecified: Secondary | ICD-10-CM | POA: Insufficient documentation

## 2022-09-21 MED ORDER — SILDENAFIL CITRATE 100 MG PO TABS
100.0000 mg | ORAL_TABLET | Freq: Every day | ORAL | 2 refills | Status: DC | PRN
  Filled 2022-09-21: qty 10, 30d supply, fill #0
  Filled 2022-10-21: qty 10, 30d supply, fill #1
  Filled 2022-12-08: qty 10, 30d supply, fill #2

## 2022-09-21 NOTE — Assessment & Plan Note (Signed)
He is following psychiatry at [facility]. Currently on Effexor XR 75mg  daily. He was previously on cymbalta, but experienced urinary incontinence with the 60mg  dosing and thus was transitioned to effexor with resolution of incontinence. He is doing well at this time but does have many socioeconomic stressors that impact his mental health. No active SI/HI.

## 2022-09-21 NOTE — Assessment & Plan Note (Signed)
[facility]. Currently on effexor XR and gabapentin for therapy. Feels fine at this time but notes many socioeconomic stressors that impact his anxiety.

## 2022-09-21 NOTE — Progress Notes (Signed)
CC: new to establish PCP  HPI:  Mr.Keyen H Korver is a 32 y.o. male with history listed below presenting to the [facility] to establish PCP. Please see individualized problem based charting for full HPI.  Past Medical History:  Diagnosis Date   Abdominal pain    Anxiety    Depression    Elevated CK 06/14/2018   Loss of weight 03/24/2021   Night sweats 03/24/2021   Non-traumatic rhabdomyolysis    Current Outpatient Medications on File Prior to Visit  Medication Sig Dispense Refill   gabapentin (NEURONTIN) 100 MG capsule Take 2 capsules (200 mg total) by mouth 3 (three) times daily. 180 capsule 1   nicotine (NICODERM CQ) 21 mg/24hr patch Place 1 patch (21 mg total) onto the skin daily. 28 patch 1   nicotine polacrilex (NICORETTE) 2 MG gum Chew 1 gum (2 mg total) by mouth as needed for smoking cessation. 100 tablet 1   venlafaxine XR (EFFEXOR XR) 75 MG 24 hr capsule Take 1 capsule (75 mg total) by mouth daily with breakfast. 60 capsule 0   No current facility-administered medications on file prior to visit.    Allergies  Allergen Reactions   Pineapple Hives     Past Surgical History:  Procedure Laterality Date   WISDOM TOOTH EXTRACTION     Family History  Problem Relation Age of Onset   Diabetes Mother    Non-Hodgkin's lymphoma Mother    Hypertension Father    Diabetes Father    Bone cancer Paternal Aunt    Social History   Socioeconomic History   Marital status: Significant Other    Spouse name: Not on file   Number of children: Not on file   Years of education: Not on file   Highest education level: Not on file  Occupational History   Not on file  Tobacco Use   Smoking status: Every Day    Packs/day: 0.25    Years: 3.00    Total pack years: 0.75    Types: Cigarettes   Smokeless tobacco: Never   Tobacco comments:    Previously used vapes, switched to cigarettes about 3 years ago due to cost.  Vaping Use   Vaping Use: Former   Substances: Nicotine,  Flavoring  Substance and Sexual Activity   Alcohol use: Yes    Comment: social alcohol use   Drug use: Yes    Types: Marijuana   Sexual activity: Yes    Birth control/protection: Condom  Other Topics Concern   Not on file  Social History Narrative   Lives with mother and sister. He is polyamorous and has 2 significant others. Currently unemployed, in school for IT. Independent in ADLs and IADLs.    Social Determinants of Health   Financial Resource Strain: Low Risk  (04/06/2022)   Overall Financial Resource Strain (CARDIA)    Difficulty of Paying Living Expenses: Not hard at all  Food Insecurity: No Food Insecurity (06/03/2020)   Hunger Vital Sign    Worried About Running Out of Food in the Last Year: Never true    Ran Out of Food in the Last Year: Never true  Transportation Needs: No Transportation Needs (06/03/2020)   PRAPARE - Hydrologist (Medical): No    Lack of Transportation (Non-Medical): No  Physical Activity: Sufficiently Active (04/06/2022)   Exercise Vital Sign    Days of Exercise per Week: 3 days    Minutes of Exercise per Session: 90 min  Stress:  Stress Concern Present (04/06/2022)   [place]    Feeling of Stress : Very much  Social Connections: Socially Isolated (04/06/2022)   Social Connection and Isolation Panel [NHANES]    Frequency of Communication with Friends and Family: Never    Frequency of Social Gatherings with Friends and Family: More than three times a week    Attends Religious Services: Never    Marine scientist or Organizations: No    Attends Archivist Meetings: Never    Marital Status: Never married  Intimate Partner Violence: Not At Risk (06/03/2020)   Humiliation, Afraid, Rape, and Kick questionnaire    Fear of Current or Ex-Partner: No    Emotionally Abused: No    Physically Abused: No    Sexually Abused: No     Review of Systems:   Negative aside from that listed in individualized problem based charting.  Physical Exam:  Vitals:   09/21/22 1329  BP: 110/64  Pulse: 93  Temp: 98.2 F (36.8 C)  TempSrc: Oral  SpO2: 98%  Weight: 172 lb 9.6 oz (78.3 kg)  Height: 5\' 7"  (1.702 m)   Physical Exam Constitutional:      Appearance: Normal appearance. He is normal weight. He is not ill-appearing.  HENT:     Head: Normocephalic and atraumatic.     Nose: Nose normal. No congestion.     Mouth/Throat:     Mouth: Mucous membranes are moist.     Pharynx: Oropharynx is clear. No oropharyngeal exudate.  Eyes:     General: No scleral icterus.    Extraocular Movements: Extraocular movements intact.     Conjunctiva/sclera: Conjunctivae normal.     Pupils: Pupils are equal, round, and reactive to light.  Cardiovascular:     Rate and Rhythm: Normal rate and regular rhythm.     Pulses: Normal pulses.     Heart sounds: Normal heart sounds. No murmur heard.    No gallop.  Pulmonary:     Effort: Pulmonary effort is normal.     Breath sounds: Normal breath sounds. No wheezing, rhonchi or rales.  Abdominal:     General: Bowel sounds are normal. There is no distension.     Palpations: Abdomen is soft.     Tenderness: There is no abdominal tenderness. There is no guarding or rebound.     Hernia: No hernia is present.  Genitourinary:    Penis: Normal.      Testes: Normal.  Musculoskeletal:        General: No swelling. Normal range of motion.     Cervical back: Normal range of motion and neck supple.  Skin:    General: Skin is warm and dry.  Neurological:     General: No focal deficit present.     Mental Status: He is alert and oriented to person, place, and time.  Psychiatric:        Mood and Affect: Mood normal.        Behavior: Behavior normal.      Assessment & Plan:   See Encounters Tab for problem based charting.  Patient discussed with Dr. Heber Hazelton

## 2022-09-21 NOTE — Assessment & Plan Note (Signed)
Patient notes development of ED about 1.5 years ago. He is polyamorous and has not had any issues with ED prior to this. He continues to have morning erections, but is unable to initiate or maintain an erection long enough for intercourse. He believes that this is primarily due to his many psychosocial stressors as he has never had this issue before when he was in a more stable situation. Physical exam without any testicular atrophy and grossly normal external exam. We discussed trial of sildenafil prn to help manage his ED. Will prescribe sildenafil 100mg  daily prn and advised him to start with half a tablet to see if 50mg  dosing is adequate for him. If not, he can take a whole tablet daily prn. We discussed that this is not intended for extended therapy.  Plan: -sildenafil 100mg  daily prn (start with 50mg  prn, if not enough can increase to 100mg  prn) -f/u in 3 months

## 2022-09-21 NOTE — Assessment & Plan Note (Signed)
Formerly used e-cigarettes, but switched to cigarettes about 3 years ago due to cost. He smokes about 3 cigarettes a day. He is on nicotine replacement therapy with patch and gum. Has not quit yet, but is trying. We discussed reaching out if patch and gum are not adequate to control his cravings as we have other options to help with this as well, such as chantix. He confirms understanding.

## 2022-09-21 NOTE — Assessment & Plan Note (Signed)
>>  ASSESSMENT AND PLAN FOR MODERATE EPISODE OF RECURRENT MAJOR DEPRESSIVE DISORDER (HCC) WRITTEN ON 09/21/2022  5:40 PM BY Merrilyn Puma, MD  He is following psychiatry at [facility]. Currently on Effexor XR 75mg  daily. He was previously on cymbalta, but experienced urinary incontinence with the 60mg  dosing and thus was transitioned to effexor with resolution of incontinence. He is doing well at this time but does have many socioeconomic stressors that impact his mental health. No active SI/HI.

## 2022-09-21 NOTE — Patient Instructions (Signed)
Mr. Gintz,  It was a pleasure seeing you in the clinic today.   I have prescribed sildenafil to help with ED. Please only take it once a day as needed about 30 minutes prior to intercourse. Please follow up in 3 months for your next visit.  Please call our clinic at [phone] if you have any questions or concerns. The best time to call is Monday-Friday from 9am-4pm, but there is someone available 24/7 at the same number. If you need medication refills, please notify your pharmacy one week in advance and they will send [place] a request.   Thank you for letting us take part in your care. We look forward to seeing you next time!

## 2022-09-26 NOTE — Progress Notes (Signed)
Internal Medicine Clinic Attending  Case discussed with the resident at the time of the visit.  We reviewed the resident's history and exam and pertinent patient test results.  I agree with the assessment, diagnosis, and plan of care documented in the resident's note.  

## 2022-09-28 ENCOUNTER — Ambulatory Visit (HOSPITAL_COMMUNITY): Payer: Medicaid Other | Admitting: Licensed Clinical Social Worker

## 2022-10-19 ENCOUNTER — Encounter (HOSPITAL_COMMUNITY): Payer: Self-pay

## 2022-10-19 ENCOUNTER — Ambulatory Visit (INDEPENDENT_AMBULATORY_CARE_PROVIDER_SITE_OTHER): Payer: Medicaid Other | Admitting: Licensed Clinical Social Worker

## 2022-10-19 DIAGNOSIS — F331 Major depressive disorder, recurrent, moderate: Secondary | ICD-10-CM | POA: Diagnosis not present

## 2022-10-19 DIAGNOSIS — F411 Generalized anxiety disorder: Secondary | ICD-10-CM

## 2022-10-19 NOTE — Progress Notes (Signed)
THERAPIST PROGRESS NOTE  Session Time: 65   Participation Level: Active  Behavioral Response: CasualAlertAnxious  Type of Therapy: Individual Therapy  Treatment Goals addressed:  Active     Anxiety Disorder CCP Problem  1 GAD      LTG: Patient will score less than 5 on the Generalized Anxiety Disorder 7 Scale (GAD-7) (Not Progressing)     Start:  04/06/22    Expected End:  04/14/23       Goal Note     Increased by two points since Oct 2023          STG: Patient will attend at least 80% of scheduled group psychotherapy sessions (Not Met (add Reason))     Start:  04/06/22    Expected End:  04/14/23         STG: Patient will complete at least 80% of assigned homework (Progressing)     Start:  04/06/22    Expected End:  04/14/23         STG: Patient will practice problem solving skills 3 times per week for the next 4 weeks (Progressing)     Start:  04/06/22    Expected End:  04/14/23           Depression CCP Problem  1 MDD       Identify three triggers for depression  (Progressing)     Start:  04/06/22    Expected End:  04/14/23       Goal Note     Finding employment  Setting Healthy Boundaries with partners          create 3 future goals  (Progressing)     Start:  04/06/22    Expected End:  04/14/23       Goal Note     Get certification for coding           Decrease PHQ-9 below 10  (Completed/Met)     Start:  04/06/22    Expected End:  10/05/22    Resolved:  09/07/22    Goal Note     Below a 10 x 2 in last 3 months          STG: Scott Gordon WILL PARTICIPATE IN AT LEAST 80% OF SCHEDULED INDIVIDUAL PSYCHOTHERAPY SESSIONS (Progressing)     Start:  04/06/22    Expected End:  04/14/23       Goal Note     Pt missed last app\ointment due to Covid but maintains 80% attendance.          STG: Scott Gordon WILL COMPLETE AT LEAST 80% OF ASSIGNED HOMEWORK (Progressing)     Start:  04/06/22    Expected End:  04/14/23          Journal 2 x  weekly  (Progressing)     Start:  04/06/22    Expected End:  04/14/23             No "No call no shows" in 1 year time frame  (Progressing)     Start:  04/06/22    Expected End:  04/14/23          Goal Note     Pt last no call no show will be voided as pt had COVID 19. Pt still has no "no call, no shows" since starting back with therapy.             ProgressTowards Goals: Progressing  Interventions: CBT, Motivational Interviewing, and Supportive  Summary: Scott Gordon is  a 32 y.o. male who presents with anxious and depressed mood\affect.  Patient was pleasant, cooperative, maintained good eye contact.  Scott Gordon was alert alert and oriented x 5.  Patient reports primary stressors as setting healthy boundaries with partners, work, and Pension scheme manager.  Patient reports that his partner Scott Gordon is moving out this Friday.  Scott Gordon feels like he is taking on a lot of responsibility to move as his partner only has Fridays and Sundays off.  Scott Gordon reports that his partner asked "can you just take my credit card and take care of it".  Scott Gordon does not feel like it is his responsibility to handle the entirety of the move as he is moving with his partner, however he still wants to remain supportive.  Other stressors for patient are work and Pension scheme manager.  Patient reports that he continues to apply for jobs with no completes for interviews.  He reports that he continues to work on Scientist, water quality for IT.  His goal is to take the examination for certification which was $250 by the end of the month.  Scott Gordon reports starting at least 3-4 times a week.  Suicidal/Homicidal: Nowithout intent/plan  Therapist Response:     Intervention/Plan: LCSW supportive therapy for praise and encouragement.  LCSW psychoanalytic therapy for patient to express thoughts, feelings, and concerns.  LCSW spoke today with patient about triggers for depression such as relationships and work.  LCSW spoke with patient  today about future goals such as IT certification.  LCSW and patient spoke today about "tips for setting healthy boundaries".  LCSW provided worksheet for patient.  Plan: Return again in 3 weeks.  Diagnosis: Moderate episode of recurrent major depressive disorder (HCC)  GAD (generalized anxiety disorder)  Collaboration of Care: Other None today   Patient/Guardian was advised Release of Information must be obtained prior to any record release in order to collaborate their care with an outside provider. Patient/Guardian was advised if they have not already done so to contact the registration department to sign all necessary forms in order for [place] to release information regarding their care.   Consent: Patient/Guardian gives verbal consent for treatment and assignment of benefits for services provided during this visit. Patient/Guardian expressed understanding and agreed to proceed.   Dory Horn, LCSW 10/19/2022

## 2022-10-21 ENCOUNTER — Other Ambulatory Visit: Payer: Self-pay

## 2022-10-26 NOTE — Progress Notes (Signed)
No Show Error. Please refer to other 10/27/22 note for full assessment

## 2022-10-27 ENCOUNTER — Other Ambulatory Visit: Payer: Self-pay

## 2022-10-27 ENCOUNTER — Encounter (HOSPITAL_COMMUNITY): Payer: Self-pay

## 2022-10-27 ENCOUNTER — Ambulatory Visit (HOSPITAL_COMMUNITY): Payer: Self-pay | Admitting: Student

## 2022-10-27 ENCOUNTER — Ambulatory Visit (INDEPENDENT_AMBULATORY_CARE_PROVIDER_SITE_OTHER): Payer: Medicaid Other | Admitting: Student

## 2022-10-27 VITALS — Wt 172.6 lb

## 2022-10-27 VITALS — BP 125/84 | HR 97

## 2022-10-27 DIAGNOSIS — F1211 Cannabis abuse, in remission: Secondary | ICD-10-CM

## 2022-10-27 DIAGNOSIS — F1210 Cannabis abuse, uncomplicated: Secondary | ICD-10-CM

## 2022-10-27 DIAGNOSIS — F17213 Nicotine dependence, cigarettes, with withdrawal: Secondary | ICD-10-CM

## 2022-10-27 DIAGNOSIS — F331 Major depressive disorder, recurrent, moderate: Secondary | ICD-10-CM | POA: Diagnosis not present

## 2022-10-27 DIAGNOSIS — F411 Generalized anxiety disorder: Secondary | ICD-10-CM | POA: Diagnosis not present

## 2022-10-27 MED ORDER — NICOTINE POLACRILEX 2 MG MT GUM
2.0000 mg | CHEWING_GUM | OROMUCOSAL | 1 refills | Status: DC | PRN
  Filled 2022-10-27: qty 100, 50d supply, fill #0
  Filled 2023-01-24: qty 110, 30d supply, fill #0

## 2022-10-27 MED ORDER — NICOTINE 21 MG/24HR TD PT24
21.0000 mg | MEDICATED_PATCH | Freq: Every day | TRANSDERMAL | 1 refills | Status: DC
  Filled 2022-10-27: qty 28, 28d supply, fill #0

## 2022-10-27 MED ORDER — VENLAFAXINE HCL ER 150 MG PO CP24
150.0000 mg | ORAL_CAPSULE | Freq: Every day | ORAL | 0 refills | Status: DC
  Filled 2022-10-27: qty 30, 30d supply, fill #0

## 2022-10-27 MED ORDER — GABAPENTIN 300 MG PO CAPS
300.0000 mg | ORAL_CAPSULE | Freq: Two times a day (BID) | ORAL | 0 refills | Status: DC
  Filled 2022-10-27: qty 60, 30d supply, fill #0
  Filled 2023-01-04: qty 60, 30d supply, fill #1

## 2022-10-28 NOTE — Progress Notes (Signed)
[place] MD Outpatient Progress Note  10/28/2022 4:30 PM Scott Gordon  MRN:  [id number]  Assessment:  Scott Gordon presents for follow-up evaluation in-person. Today, 10/28/22, patient reports symptoms of severe depression have largely resolved.  He does endorse that he has resumed vaping and smoking cannabis.  He reports continued anxiety as well as continued depression symptoms.  He was agreeable to increasing venlafaxine to 150 mg daily as well as changing gabapentin to 300 mg twice daily.  Advised patient to cease all cannabis use in order to better assess effect of increasing venlafaxine and changing gabapentin frequency.  He appeared to be agreeable to this. He also wants to continue inattention symptoms but understands that we need to first address his MDD and GAD first.   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 Past medication trials:  wellbutrin (migraine, memory loss), fluoxetine  Status of problem: active, PHQ9 score=12 Interventions: --INCREASE venlafaxine to 150 mg daily for depressive symptoms -- Discontinued trazodone 50 mg qhs prn for insomnia --Recommend continuing to exercise -- Recommend continuing psychotherapy for management of present stressors   # Generalized Anxiety Disorder Past medication trials: hydroxyzine (ineffective) Status of problem: Improving Interventions: -- CHANGE gabapentin to 300 bid for anxiety symptoms             --Could consider decreasing to decrease daytime fatigue next visit if anxiety still well-controlled and lower dose of  gabapentin -- Venlafaxine as above   # Nicotine Use Disorder, improving -Continue nicotine patch -Continue nicotine gum PRN  #Cannabis Use -Advise cessation   #History of ADHD Unclear if meets criteria versus inattention and hyperactivity secondary to anxiety Past medication trials: Adderall (urinary retention), strattera (urinary retention) Status of problem: Stable Interventions -Continue to monitor symptoms   Patient was given contact information for behavioral health clinic and was instructed to call 911 for emergencies.   Subjective:  Chief Complaint:  Chief Complaint  Patient presents with   Anxiety   Depression    Interval History: Patient reports that he has been doing somewhat better in the past month.  While he is still experiencing depression and anxiety, he is experiencing less of the neurovegetative symptoms that he had been originally experiencing.  He did admit to resuming his cannabis use by smoking and vaping.  He currently is still unemployed and looking for employment.  He continues to attend therapy as well as taking his psychotropic medications.  He reports that his sleep is somewhat improved but will still have significant negative self-talk at night. He denies any significant changes in appetite.  He denies present suicidal ideation/homicidal ideation.  He denies auditory/visual hallucinations.  Visit Diagnosis:    ICD-10-CM   1. Cigarette nicotine dependence with withdrawal  F17.213     2. GAD (generalized anxiety disorder)  F41.1     3. Moderate episode of recurrent major depressive disorder ([place])  F33.1        Past Medical History:  Past Medical History:  Diagnosis Date   ADHD, predominantly inattentive type    Anxiety    Depression    Nicotine dependence     Past Surgical History:  Procedure Laterality Date   WISDOM TOOTH EXTRACTION  Family History:  Family History  Problem Relation Age of Onset   Diabetes Mother     Non-Hodgkin's lymphoma Mother    Hypertension Father    Diabetes Father    Bone cancer Paternal Aunt     Social History:  Social History   Socioeconomic History   Marital status: Significant Other    Spouse name: Not on file   Number of children: Not on file   Years of education: Not on file   Highest education level: Not on file  Occupational History   Not on file  Tobacco Use   Smoking status: Every Day    Packs/day: 0.25    Years: 3.00    Total pack years: 0.75    Types: Cigarettes   Smokeless tobacco: Never   Tobacco comments:    Previously used vapes, switched to cigarettes about 3 years ago due to cost.  Vaping Use   Vaping Use: Former   Substances: Nicotine, Flavoring  Substance and Sexual Activity   Alcohol use: Yes    Comment: social alcohol use   Drug use: Yes    Types: Marijuana   Sexual activity: Yes    Birth control/protection: Condom  Other Topics Concern   Not on file  Social History Narrative   Lives with mother and sister. He is polyamorous and has 2 significant others. Currently unemployed, in school for IT. Independent in ADLs and IADLs.    Social Determinants of Health   Financial Resource Strain: Low Risk  (04/06/2022)   Overall Financial Resource Strain (CARDIA)    Difficulty of Paying Living Expenses: Not hard at all  Food Insecurity: No Food Insecurity (06/03/2020)   Hunger Vital Sign    Worried About Running Out of Food in the Last Year: Never true    Ran Out of Food in the Last Year: Never true  Transportation Needs: No Transportation Needs (06/03/2020)   PRAPARE - Hydrologist (Medical): No    Lack of Transportation (Non-Medical): No  Physical Activity: Sufficiently Active (04/06/2022)   Exercise Vital Sign    Days of Exercise per Week: 3 days    Minutes of Exercise per Session: 90 min  Stress: Stress Concern Present (04/06/2022)   [place]     Feeling of Stress : Very much  Social Connections: Socially Isolated (04/06/2022)   Social Connection and Isolation Panel [NHANES]    Frequency of Communication with Friends and Family: Never    Frequency of Social Gatherings with Friends and Family: More than three times a week    Attends Religious Services: Never    Marine scientist or Organizations: No    Attends Archivist Meetings: Never    Marital Status: Never married    Allergies:  Allergies  Allergen Reactions   Pineapple Hives    Current Medications: Current Outpatient Medications  Medication Sig Dispense Refill   gabapentin (NEURONTIN) 300 MG capsule Take 1 capsule (300 mg total) by mouth 2 (two) times daily. 120 capsule 0   nicotine (NICODERM CQ) 21 mg/24hr patch Place 1 patch (21 mg total) onto the skin daily. 28 patch 1   nicotine polacrilex (NICORETTE) 2 MG gum Chew 1 gum (2 mg total) by mouth as needed for smoking cessation. 100 tablet 1   sildenafil (VIAGRA) 100 MG tablet Take 1 tablet (100 mg total) by mouth daily as needed for erectile dysfunction. 10 tablet 2   venlafaxine  XR (EFFEXOR XR) 150 MG 24 hr capsule Take 1 capsule (150 mg total) by mouth daily with breakfast. 60 capsule 0   No current facility-administered medications for this visit.    ROS: Review of Systems  Objective:  Psychiatric Specialty Exam: There were no vitals taken for this visit.There is no height or weight on file to calculate BMI.  General Appearance: Well Groomed  Eye Contact:  Good  Speech:  Clear and Coherent and Normal Rate  Volume:  Normal  Mood:  Euthymic  Affect:  Appropriate and Congruent  Thought Content: Logical   Suicidal Thoughts:  No  Homicidal Thoughts:  No  Thought Process:  Coherent, Goal Directed, and Linear  Orientation:  Full (Time, Place, and Person)    Memory:  Remote;   Good  Judgment:  Fair  Insight:  Fair  Concentration:  Concentration: Fair  Recall:  [place] of Knowledge: Fair   Language: Fair  Psychomotor Activity:  Normal  Akathisia:  No  AIMS (if indicated): not done  Assets:  Communication Skills Desire for Improvement Financial Resources/Insurance Housing Intimacy Leisure Time Physical Health Resilience Social Support Talents/Skills Transportation Vocational/Educational  ADL's:  Intact  Cognition: WNL  Sleep:  Fair   PE: General: well-appearing; no acute distress  Pulm: no increased work of breathing on room air  Strength & Muscle Tone: within normal limits Neuro: no focal neurological deficits observed  Gait & Station: normal  Metabolic Disorder Labs: Lab Results  Component Value Date   HGBA1C 5.4 09/28/2021   MPG 108 09/28/2021   No results found for: "PROLACTIN" Lab Results  Component Value Date   CHOL 155 09/28/2021   TRIG 30 09/28/2021   HDL 60 09/28/2021   CHOLHDL 2.6 09/28/2021   VLDL 6 09/28/2021   LDLCALC 89 09/28/2021   Lab Results  Component Value Date   TSH 0.700 09/28/2021   TSH 0.982 03/23/2021    Therapeutic Level Labs: No results found for: "LITHIUM" No results found for: "VALPROATE" No results found for: "CBMZ"  Screenings: AUDIT    Flowsheet Row Counselor from 06/03/2020 in [facility]  Alcohol Use Disorder Identification Test Final Score (AUDIT) 4      GAD-7    Flowsheet Row Counselor from 09/07/2022 in [facility] Counselor from 06/08/2022 in [facility] Counselor from 04/26/2022 in [facility] Counselor from 04/06/2022 in [facility] Video Visit from 05/03/2021 in [facility]  Total GAD-7 Score '8 6 10 14 15      '$ PHQ2-9    [place] from 10/27/2022 in [facility] Counselor from 09/07/2022 in [facility] Counselor from 06/08/2022 in [facility] Counselor from 04/26/2022 in [facility] Counselor from 04/06/2022 in [place]  PHQ-2 Total Score '3 2 2 2 5  '$ PHQ-9 Total Score '12 7 8 11 15      '$ [place] from 06/08/2022 in [facility] Office Visit from 04/26/2022 in [facility] Counselor from 04/06/2022 in [place] Error: Question 1 not populated       Collaboration of Care: Collaboration of Care:   Patient/Guardian was advised Release of Information must be obtained prior to any record release in order to collaborate their care  with an outside provider. Patient/Guardian was advised if they have not already done so to contact the registration department to sign all necessary forms in order for [place] to release information regarding their care.   Consent: Patient/Guardian gives verbal consent for treatment and assignment of benefits for services provided during this visit. Patient/Guardian expressed understanding and agreed to proceed.   A total of 30 minutes was spent involved in face to face clinical care, chart review, documentation.   France Ravens, MD 10/28/2022, 4:30 PM

## 2022-10-31 ENCOUNTER — Other Ambulatory Visit: Payer: Self-pay

## 2022-11-23 ENCOUNTER — Ambulatory Visit (INDEPENDENT_AMBULATORY_CARE_PROVIDER_SITE_OTHER): Payer: Medicaid Other | Admitting: Licensed Clinical Social Worker

## 2022-11-23 DIAGNOSIS — F411 Generalized anxiety disorder: Secondary | ICD-10-CM

## 2022-11-23 DIAGNOSIS — F331 Major depressive disorder, recurrent, moderate: Secondary | ICD-10-CM

## 2022-11-23 NOTE — Progress Notes (Signed)
THERAPIST PROGRESS NOTE  Session Time: 20  Participation Level: Active  Behavioral Response: CasualAlertAnxious and Depressed  Type of Therapy: Individual Therapy  Treatment Goals addressed:  Active     Anxiety Disorder CCP Problem  1 GAD      LTG: Patient will score less than 5 on the Generalized Anxiety Disorder 7 Scale (GAD-7) (Not Progressing)     Start:  04/06/22    Expected End:  04/14/23         STG: Patient will attend at least 80% of scheduled group psychotherapy sessions (Not Progressing)     Start:  04/06/22    Expected End:  04/14/23         STG: Patient will complete at least 80% of assigned homework (Progressing)     Start:  04/06/22    Expected End:  04/14/23         STG: Patient will practice problem solving skills 3 times per week for the next 4 weeks (Progressing)     Start:  04/06/22    Expected End:  04/14/23           Depression CCP Problem  1 MDD       Identify three triggers for depression  (Progressing)     Start:  04/06/22    Expected End:  04/14/23         create 3 future goals  (Progressing)     Start:  04/06/22    Expected End:  04/14/23         Decrease PHQ-9 below 10  (Completed/Met)     Start:  04/06/22    Expected End:  10/05/22    Resolved:  09/07/22    Goal Note     Below a 10 x 2 in last 3 months          STG: Scott Gordon WILL PARTICIPATE IN AT LEAST 80% OF SCHEDULED INDIVIDUAL PSYCHOTHERAPY SESSIONS (Progressing)     Start:  04/06/22    Expected End:  04/14/23         STG: Scott Gordon WILL COMPLETE AT LEAST 80% OF ASSIGNED HOMEWORK (Progressing)     Start:  04/06/22    Expected End:  04/14/23          Journal 2 x weekly  (Progressing)     Start:  04/06/22    Expected End:  04/14/23             No "No call no shows" in 1 year time frame  (Progressing)     Start:  04/06/22    Expected End:  04/14/23               ProgressTowards Goals: Progressing  Interventions: CBT, Motivational Interviewing, and  Supportive  Summary: Scott Gordon is a 32 y.o. male who presents with depressed, flat, anxious mood\affect.  Patient was pleasant, cooperative, maintained good eye contact.  Scott Gordon was alert and oriented x 5.  He engaged well in therapy session was dressed casually.  Patient reports increased depression for hopelessness, worthlessness, lack of motivation, and insomnia.  He reports only getting 11 hours of sleep last week.  Scott Gordon is concerned that this is attributed to the increase in depression medication.  LCSW advised patient to contact nurses station to discuss medication options with the provider.     He reports stress in one of his relationships due to his car being in their possession.  Patient reports that close and stipulations have not been made and conversations  to make rules and stipulations about the car have been avoided due to partner schedule.  Patient reports tension and worry in regards to his relationship.  Suicidal/Homicidal: Nowithout intent/plan  Therapist Response:    Intervention/Plan: LCSW psychoanalytic therapy for patient to express thoughts, feelings and concerns.  LCSW supportive therapy for praise and encouragement.  LCSW administered the GAD-7.  LCSW administered the PHQ-9.  LCSW notes an increase in both PHQ-9 and GAD-7.  LCSW and March reviewed scores together.  LCSW used motivational interviewing for reflective listening and open-ended questions.    Plan: Return again in 3 weeks.  Diagnosis: Moderate episode of recurrent major depressive disorder (H[facility])  GAD (generalized anxiety disorder)  Collaboration of Care: Other None today  Patient/Guardian was advised Release of Information must be obtained prior to any record release in order to collaborate their care with an outside provider. Patient/Guardian was advised if they have not already done so to contact the registration department to sign all necessary forms in order for [place] to release  information regarding their care.   Consent: Patient/Guardian gives verbal consent for treatment and assignment of benefits for services provided during this visit. Patient/Guardian expressed understanding and agreed to proceed.   Dory Horn, LCSW 11/23/2022

## 2022-11-25 ENCOUNTER — Telehealth (HOSPITAL_COMMUNITY): Payer: Self-pay | Admitting: Student

## 2022-11-25 ENCOUNTER — Other Ambulatory Visit: Payer: Self-pay

## 2022-11-25 DIAGNOSIS — F331 Major depressive disorder, recurrent, moderate: Secondary | ICD-10-CM

## 2022-11-25 MED ORDER — VENLAFAXINE HCL ER 37.5 MG PO CP24
75.0000 mg | ORAL_CAPSULE | Freq: Every day | ORAL | 0 refills | Status: DC
  Filled 2022-11-25: qty 60, 30d supply, fill #0

## 2022-11-25 NOTE — Telephone Encounter (Signed)
Patient called in and is wanting to speak with you about the side effects of venlafaxine XR (EFFEXOR XR) 150 MG 24 hr capsule since increasing the dosage.

## 2022-11-25 NOTE — Telephone Encounter (Signed)
Spoke with patient.  Patient reports having stressful event happening 2 weeks ago which led to severe depression and was concerned that venlafaxine XR at higher does was possibly the cause or at least need to be changed. I discussed with him that it was unlikely to cause worsening depression but understood that venlafaxine may not be the best option for him.  He also endorses that he had initially cut back significantly on his marijuana use but had resumed using it at his regular frequency. He states that the marijuana use is one of the few things that is preventing him from feeling suicidal. I discussed how marijuana acts as a depressant long term and would also interfere with the benefits his psychotropics were helping with. I discussed with him going to [facility] if he is unable to maintain safety and he was able to contract for safety.  I discussed that we would discuss in 2 weeks plan to transition to different medication so I have sent to his pharmacy effexor 37.5 mg tablets that he should take 2 of daily until my appointments. We will discuss transition to a different antidepressant which he is agreeable.   PLAN: -Decrease effexor back to 75 mg (2x37.5 tablets) daily until next appointment (ordered and sent to preferred pharmacy) -Plan to decrease dosage again at time of visit and likely cross taper to different antidepressant -Recommend cannabis cessation   Considerations to transition to: -Switch to SSRI with abilify as an adjunct -Switch to mirtazapine as an atypical antidepressant -Continue effexor but start abilify as an adjunct -Switch to seroquel with plan to titrate dose to 100-300 mg -Consider neuromodulation (ECT/TMS) if symptoms continue to not improve (although lack of insurance may be a barrier)  France Ravens, MD PGY2 Psychiatry Resident

## 2022-11-29 ENCOUNTER — Other Ambulatory Visit: Payer: Self-pay

## 2022-12-06 ENCOUNTER — Ambulatory Visit (INDEPENDENT_AMBULATORY_CARE_PROVIDER_SITE_OTHER): Payer: Medicaid Other | Admitting: Licensed Clinical Social Worker

## 2022-12-06 DIAGNOSIS — F411 Generalized anxiety disorder: Secondary | ICD-10-CM

## 2022-12-06 DIAGNOSIS — F331 Major depressive disorder, recurrent, moderate: Secondary | ICD-10-CM

## 2022-12-06 NOTE — Progress Notes (Signed)
THERAPIST PROGRESS NOTE  Session Time: 84  Participation Level: Active  Behavioral Response: CasualAlertAnxious and Depressed  Type of Therapy: Individual Therapy  Treatment Goals addressed:  Active     Anxiety Disorder CCP Problem  1 GAD      LTG: Patient will score less than 5 on the Generalized Anxiety Disorder 7 Scale (GAD-7) (Not Progressing)     Start:  04/06/22    Expected End:  04/14/23         STG: Patient will attend at least 80% of scheduled group psychotherapy sessions (Progressing)     Start:  04/06/22    Expected End:  04/14/23         STG: Patient will complete at least 80% of assigned homework (Progressing)     Start:  04/06/22    Expected End:  04/14/23         STG: Patient will practice problem solving skills 3 times per week for the next 4 weeks (Progressing)     Start:  04/06/22    Expected End:  04/14/23           Depression CCP Problem  1 MDD       Identify three triggers for depression  (Progressing)     Start:  04/06/22    Expected End:  04/14/23         create 3 future goals  (Progressing)     Start:  04/06/22    Expected End:  04/14/23         Decrease PHQ-9 below 10  (Completed/Met)     Start:  04/06/22    Expected End:  10/05/22    Resolved:  09/07/22    Goal Note     Below a 10 x 2 in last 3 months          STG: [place] WILL PARTICIPATE IN AT LEAST 80% OF SCHEDULED INDIVIDUAL PSYCHOTHERAPY SESSIONS (Progressing)     Start:  04/06/22    Expected End:  04/14/23         STG: [name] WILL COMPLETE AT LEAST 80% OF ASSIGNED HOMEWORK (Progressing)     Start:  04/06/22    Expected End:  04/14/23          Journal 2 x weekly  (Progressing)     Start:  04/06/22    Expected End:  04/14/23             No "No call no shows" in 1 year time frame  (Progressing)     Start:  04/06/22    Expected End:  04/14/23               ProgressTowards Goals: Progressing  Interventions: CBT, Motivational Interviewing,  Supportive, and Reframing  Summary: [name] is a 32 y.o. male who presents with depressed and anxious mood\affect.  Patient was pleasant, cooperative, maintained good eye contact.  He engaged well in therapy session was dressed casually.  Patient reports everything has been going "well" since last visit.  Patient reports a decrease in depression and anxiety.  Scott Gordon attributes this to getting off of his medication.  Patient reports that he spoke with his medication provider and were trying to wean him off the medication.  This was not successful as patient could not afford a lower dosage of the medication.  Patient reports he stopped taking the medication for multiple days and because of that completely detoxed off of it without weaning off of it.  Patient states that  this was not ideal but it did work.   Patient reports today that he has been on a job at "[employer]".  This is a part-time employment at [employer] an hour.  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  Patient reports that his communication has improved with his partner "Scott Gordon".  Patient and his partner are currently sharing a vehicle that patient owns and that they have communicated plan for him to get the car back in the next 3 to 4 months as his partner stays for review vehicle.  Suicidal/Homicidal: Nowithout intent/plan  Therapist Response:    Intervention/plan: LCSW used motivational interviewing for patient using positive affirmations, reflective listening, and open-ended questions.  LCSW psychoanalytic therapy for patient to express thoughts, feelings and emotions.  LCSW supportive therapy for praise and encouragement.  Plan for patient is to continue to go through orientation for new job, and start to budget about plan for finances.   Plan: Return again in 3 weeks.  Diagnosis: Moderate episode of recurrent major depressive disorder  GAD (generalized anxiety  disorder)  Collaboration of Care: Other None today   Patient/Guardian was advised Release of Information must be obtained prior to any record release in order to collaborate their care with an outside provider. Patient/Guardian was advised if they have not already done so to contact the registration department to sign all necessary forms in order for [place] to release information regarding their care.   Consent: Patient/Guardian gives verbal consent for treatment and assignment of benefits for services provided during this visit. Patient/Guardian expressed understanding and agreed to proceed.   Dory Horn, LCSW 12/06/2022

## 2022-12-08 ENCOUNTER — Other Ambulatory Visit: Payer: Self-pay

## 2022-12-15 ENCOUNTER — Encounter (HOSPITAL_COMMUNITY): Payer: Self-pay | Admitting: Student

## 2022-12-15 NOTE — Progress Notes (Deleted)
BH MD Outpatient Progress Note  12/15/2022 11:58 AM Scott Gordon  MRN:  [id number]  Assessment:  Scott Gordon presents for follow-up evaluation in-person. 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 Past medication trials:  wellbutrin (migraine, memory loss), fluoxetine (ineffective), venlafaxine (ineffective) Status of problem: active, PHQ9 score=12 Interventions: -Effexor self discontinued  -- Discontinued trazodone 50 mg qhs prn for insomnia --Recommend continuing to exercise -- Recommend continuing psychotherapy for management of present stressors   # Generalized Anxiety Disorder Past medication trials: hydroxyzine (ineffective) Status of problem: Improving Interventions: -- CHANGE gabapentin to 300 bid for anxiety symptoms             --Could consider decreasing to decrease daytime fatigue next visit if anxiety still well-controlled and lower dose of gabapentin -- Venlafaxine as above   # Nicotine Use Disorder, improving -Continue nicotine patch -Continue nicotine gum PRN   #Cannabis Use -Advise cessation   #History of ADHD Unclear if meets criteria versus inattention and hyperactivity secondary to anxiety Past medication trials: Adderall (urinary retention), strattera (urinary retention) Status of problem: Stable Interventions -Continue to monitor symptoms    Patient was given contact information for behavioral health clinic and was instructed to call 911 for emergencies.   Subjective:  Chief Complaint: No chief  complaint on file.   Interval History: ***  Visit Diagnosis: No diagnosis found.  Past Psychiatric History: ***  Past Medical History:  Past Medical History:  Diagnosis Date   ADHD, predominantly inattentive type    Anxiety    Depression    Nicotine dependence     Past Surgical History:  Procedure Laterality Date   WISDOM TOOTH EXTRACTION      Family History:  Family History  Problem Relation Age of Onset   Diabetes Mother    Non-Hodgkin's lymphoma Mother    Hypertension Father    Diabetes Father    Bone cancer Paternal Aunt     Social History:  Social History   Socioeconomic History   Marital status: Significant Other    Spouse name: Not on file   Number of children: Not on file   Years of education: Not on file   Highest education level: Not on file  Occupational History   Not on file  Tobacco Use   Smoking status: Every Day    Packs/day: 0.25    Years: 3.00    Additional pack years: 0.00    Total pack years: 0.75    Types: Cigarettes   Smokeless tobacco: Never   Tobacco comments:    Previously used vapes, switched to cigarettes about 3 years ago due to cost.  Vaping Use   Vaping Use: Former   Substances: Nicotine, Flavoring  Substance and Sexual Activity   Alcohol use: Yes    Comment: social alcohol use   Drug use: Yes    Types: Marijuana   Sexual activity: Yes    Birth control/protection: Condom  Other Topics Concern   Not on file  Social History Narrative   Lives with mother and sister. He is polyamorous and has 2 significant others. Currently unemployed, in school for IT. Independent in ADLs and IADLs.  Social Determinants of Health   Financial Resource Strain: Low Risk  (04/06/2022)   Overall Financial Resource Strain (CARDIA)    Difficulty of Paying Living Expenses: Not hard at all  Food Insecurity: No Food Insecurity (06/03/2020)   Hunger Vital Sign    Worried About Running Out of Food in the Last Year: Never true    Ran Out of Food  in the Last Year: Never true  Transportation Needs: No Transportation Needs (06/03/2020)   PRAPARE - Administrator, Civil Service (Medical): No    Lack of Transportation (Non-Medical): No  Physical Activity: Sufficiently Active (04/06/2022)   Exercise Vital Sign    Days of Exercise per Week: 3 days    Minutes of Exercise per Session: 90 min  Stress: Stress Concern Present (04/06/2022)   [employer] of Occupational Health - Occupational Stress Questionnaire    Feeling of Stress : Very much  Social Connections: Socially Isolated (04/06/2022)   Social Connection and Isolation Panel [NHANES]    Frequency of Communication with Friends and Family: Never    Frequency of Social Gatherings with Friends and Family: More than three times a week    Attends Religious Services: Never    Database administrator or Organizations: No    Attends Banker Meetings: Never    Marital Status: Never married    Allergies:  Allergies  Allergen Reactions   Pineapple Hives    Current Medications: Current Outpatient Medications  Medication Sig Dispense Refill   gabapentin (NEURONTIN) 300 MG capsule Take 1 capsule (300 mg total) by mouth 2 (two) times daily. 120 capsule 0   nicotine (NICODERM CQ) 21 mg/24hr patch Place 1 patch (21 mg total) onto the skin daily. 28 patch 1   nicotine polacrilex (NICORETTE) 2 MG gum Chew 1 gum (2 mg total) by mouth as needed for smoking cessation. 100 tablet 1   sildenafil (VIAGRA) 100 MG tablet Take 1 tablet (100 mg total) by mouth daily as needed for erectile dysfunction. 10 tablet 2   venlafaxine XR (EFFEXOR XR) 37.5 MG 24 hr capsule Take 2 capsules (75 mg total) by mouth daily with breakfast. 60 capsule 0   No current facility-administered medications for this visit.    ROS: Review of Systems  Objective:  Psychiatric Specialty Exam: There were no vitals taken for this visit.There is no height or weight on file to calculate BMI.  General  Appearance: {Appearance:22683}  Eye Contact:  {BHH EYE CONTACT:22684}  Speech:  {Speech:22685}  Volume:  {Volume (PAA):22686}  Mood:  {BHH MOOD:22306}  Affect:  {Affect (PAA):22687}  Thought Process:  {Thought Process (PAA):22688}  Orientation:  {BHH ORIENTATION (PAA):22689}  Thought Content: {Thought Content:22690}   Suicidal Thoughts:  {ST/HT (PAA):22692}  Homicidal Thoughts:  {ST/HT (PAA):22692}  Memory:  {BHH MEMORY:22881}  Judgment:  {Judgement (PAA):22694}  Insight:  {Insight (PAA):22695}  Psychomotor Activity:  {Psychomotor (PAA):22696}  Concentration:  {Concentration:21399}              Assets:  {Assets (PAA):22698}  ADL's:  {BHH AYO'K:59977}  Cognition: {chl bhh cognition:304700322}  Sleep:  {BHH GOOD/FAIR/POOR:22877}   PE: General: well-appearing; no acute distress  Pulm: no increased work of breathing on room air  Strength & Muscle Tone: {desc; muscle tone:32375} Neuro: no focal neurological deficits observed  Gait & Station: {PE GAIT ED SFSE:39532}  Metabolic Disorder Labs: Lab Results  Component Value Date   HGBA1C 5.4 09/28/2021   MPG 108 09/28/2021   No results found for: "  PROLACTIN" Lab Results  Component Value Date   CHOL 155 09/28/2021   TRIG 30 09/28/2021   HDL 60 09/28/2021   CHOLHDL 2.6 09/28/2021   VLDL 6 09/28/2021   LDLCALC 89 09/28/2021   Lab Results  Component Value Date   TSH 0.700 09/28/2021   TSH 0.982 03/23/2021    Therapeutic Level Labs: No results found for: "LITHIUM" No results found for: "VALPROATE" No results found for: "CBMZ"  Screenings: AUDIT    Flowsheet Row Counselor from 06/03/2020 in [facility]  Alcohol Use Disorder Identification Test Final Score (AUDIT) 4      GAD-7    Flowsheet Row Counselor from 11/23/2022 in [facility] Counselor from 09/07/2022 in [facility] Counselor from 06/08/2022 in [facility] Counselor from 04/26/2022 in [facility] Counselor from 04/06/2022 in [facility]  Total GAD-7 Score 13 8 6 10 14       PHQ2-9    Flowsheet Row Counselor from 11/23/2022 in [facility] Clinical Support from 10/27/2022 in [facility] Counselor from 09/07/2022 in [facility] Counselor from 06/08/2022 in [facility] Counselor from 04/26/2022 in [facility]  PHQ-2 Total Score 4 3 2 2 2   PHQ-9 Total Score 19 12 7 8 11       Flowsheet Row Counselor from 11/23/2022 in [facility] Counselor from 06/08/2022 in [facility] Office Visit from 04/26/2022 in [facility]  C-SSRS RISK CATEGORY Low Risk Low Risk Low Risk       Collaboration of Care: Collaboration of Care: [facility] OP Collaboration of WGNF:[id number]}  Patient/Guardian was advised Release of Information must be obtained prior to any record release in order to collaborate their care with an outside provider. Patient/Guardian was advised if they have not already done so to contact the registration department to sign all necessary forms in order for [place] to release information regarding their care.   Consent: Patient/Guardian gives verbal consent for treatment and assignment of benefits for services provided during this visit. Patient/Guardian expressed understanding and agreed to proceed.   A total of *** minutes was spent involved in face to face clinical care, chart review, and documentation.   Park Pope, MD 12/15/2022, 11:58 AM

## 2022-12-19 ENCOUNTER — Other Ambulatory Visit: Payer: Self-pay

## 2022-12-19 ENCOUNTER — Ambulatory Visit: Payer: Medicaid Other

## 2022-12-19 VITALS — BP 123/76 | HR 67 | Temp 98.7°F | Wt 174.1 lb

## 2022-12-19 DIAGNOSIS — F17213 Nicotine dependence, cigarettes, with withdrawal: Secondary | ICD-10-CM | POA: Diagnosis not present

## 2022-12-19 DIAGNOSIS — F900 Attention-deficit hyperactivity disorder, predominantly inattentive type: Secondary | ICD-10-CM

## 2022-12-19 DIAGNOSIS — N521 Erectile dysfunction due to diseases classified elsewhere: Secondary | ICD-10-CM

## 2022-12-19 DIAGNOSIS — F411 Generalized anxiety disorder: Secondary | ICD-10-CM | POA: Diagnosis present

## 2022-12-19 DIAGNOSIS — Z131 Encounter for screening for diabetes mellitus: Secondary | ICD-10-CM

## 2022-12-19 DIAGNOSIS — F331 Major depressive disorder, recurrent, moderate: Secondary | ICD-10-CM

## 2022-12-19 DIAGNOSIS — Z13220 Encounter for screening for lipoid disorders: Secondary | ICD-10-CM

## 2022-12-19 MED ORDER — SILDENAFIL CITRATE 100 MG PO TABS
100.0000 mg | ORAL_TABLET | Freq: Every day | ORAL | 2 refills | Status: DC | PRN
  Filled 2022-12-19: qty 10, 10d supply, fill #0
  Filled 2023-01-04: qty 10, 30d supply, fill #0
  Filled 2023-02-27: qty 10, 30d supply, fill #1
  Filled 2023-04-13: qty 10, 30d supply, fill #2

## 2022-12-19 NOTE — Assessment & Plan Note (Addendum)
About a month ago, he noticed that the effexor was actually making him more depressed. He contacted his psychiatrist and an effexor taper was planned, but he lost insurance and couldn't fill his effxor so he effectively quit without taper. He did have withdrawal symptoms. He is actually doing well now because his social situation is better, but PHQ9 is still 12 and he still believes he could benefit from medication therapy. He has also previously been worked up for bipolar, specifically bipolar 2, but he is not sure if he was formally diagnosed with this.

## 2022-12-19 NOTE — Progress Notes (Deleted)
   CC: 51-month follow-up  HPI:  Mr.Scott Gordon is a 32 y.o. with past medical history as below who presents for 48-month follow-up.  Please see detailed assessment and plan for HPI.  Past Medical History:  Diagnosis Date   ADHD, predominantly inattentive type    Anxiety    Depression    Nicotine dependence    Review of Systems: Please see detailed assessment and plan for pertinent ROS.  Physical Exam:  Vitals:   12/19/22 1312  Weight: 174 lb 1.6 oz (79 kg)   Physical Exam Constitutional:      General: He is not in acute distress. HENT:     Head: Normocephalic and atraumatic.  Eyes:     Extraocular Movements: Extraocular movements intact.  Cardiovascular:     Rate and Rhythm: Normal rate and regular rhythm.     Heart sounds: No murmur heard. Pulmonary:     Effort: Pulmonary effort is normal.     Breath sounds: No wheezing, rhonchi or rales.  Musculoskeletal:     Cervical back: Neck supple.     Right lower leg: No edema.     Left lower leg: No edema.  Skin:    General: Skin is warm and dry.  Neurological:     Mental Status: He is alert and oriented to person, place, and time.  Psychiatric:        Mood and Affect: Mood normal.        Behavior: Behavior normal.      Assessment & Plan:   See Encounters Tab for problem based charting.  No problem-specific Assessment & Plan notes found for this encounter.    Patient {GC/GE:3044014::"discussed with","seen with"} Dr. {NAMES:3044014::"Guilloud","Hoffman","Mullen","Narendra","Williams","Vincent"}

## 2022-12-19 NOTE — Assessment & Plan Note (Signed)
>>  ASSESSMENT AND PLAN FOR MODERATE EPISODE OF RECURRENT MAJOR DEPRESSIVE DISORDER (HCC) WRITTEN ON 12/20/2022  2:03 PM BY Lyndle Herrlich, MD  About a month ago, he noticed that the effexor was actually making him more depressed. He contacted his psychiatrist and an effexor taper was planned, but he lost insurance and couldn't fill his effxor so he effectively quit without taper. He did have withdrawal symptoms. He is actually doing well now because his social situation is better, but PHQ9 is still 12 and he still believes he could benefit from medication therapy. He has also previously been worked up for bipolar, specifically bipolar 2, but he is not sure if he was formally diagnosed with this.  He denies SI and HI, A/V/perceptual disturbances given the complexity of his psychiatric comorbidities, as well as his possible prior workup for bipolar disorder, I am hesitant to start new medication management clinic today.  He does follow-up with psychiatry and I encouraged him to make a close follow-up appointment with to reestablish on medical therapy in addition to CBT.  Of note, he has trialed and failed multiple medications.  He might be a candidate for ECT or ketamine therapy.  I will leave this to shared decision making with my psych colleagues as well.

## 2022-12-19 NOTE — Patient Instructions (Addendum)
Mr.Scott Gordon, it was a pleasure seeing you today! You endorsed feeling well today. Below are some of the things we talked about this visit. We look forward to seeing you in the follow up appointment!  Today we discussed: HPV: please check with the health department if you can get the HPV vaccine there Anxiety/Depression: please make an appt with your psychiatrist in the next couple of weeks. I refilled your meds I will call with lab results  I have ordered the following labs today:  Lab Orders         Lipid Profile         Hemoglobin A1c        Referrals ordered today:   Referral Orders  No referral(s) requested today     I have ordered the following medication/changed the following medications:   Stop the following medications: Medications Discontinued During This Encounter  Medication Reason   venlafaxine XR (EFFEXOR XR) 37.5 MG 24 hr capsule Change in therapy   sildenafil (VIAGRA) 100 MG tablet Reorder     Start the following medications: Meds ordered this encounter  Medications   sildenafil (VIAGRA) 100 MG tablet    Sig: Take 1 tablet (100 mg total) by mouth daily as needed for erectile dysfunction.    Dispense:  10 tablet    Refill:  2     Follow-up:  6 months-1 year    Please make sure to arrive 15 minutes prior to your next appointment. If you arrive late, you may be asked to reschedule.   We look forward to seeing you next time. Please call our clinic at [phone] if you have any questions or concerns. The best time to call is Monday-Friday from 9am-4pm, but there is someone available 24/7. If after hours or the weekend, call the main hospital number and ask for the Internal Medicine Resident On-Call. If you need medication refills, please notify your pharmacy one week in advance and they will send [place] a request.  Thank you for letting us take part in your care. Wishing you the best!  Thank you, Lyndle Herrlich MD

## 2022-12-20 NOTE — Assessment & Plan Note (Addendum)
Down to smoking about 2 to 3 cigarettes a day.  Working on cessation with nicotine patches and gum.  We discussed Chantix, but I do not think it would be a good time to start this medication given he can exacerbate existing psychiatric symptoms/cause nightmares.  He is also previously failed a trial of Wellbutrin.  Will continue to follow up and encourage smoking cessation.

## 2022-12-20 NOTE — Progress Notes (Signed)
   CC: routine follow up  HPI:  Scott Gordon is a 32 y.o.-year-old male with past medical history as below presenting for routine follow up.  Please see encounters tab for problem-based charting.  Past Medical History:  Diagnosis Date   ADHD, predominantly inattentive type    Anxiety    Depression    Nicotine dependence    Review of Systems: As in HPI.  Please see encounters tab for problem based charting.  Physical Exam:  Vitals:   12/19/22 1312  BP: 123/76  Pulse: 67  Temp: 98.7 F (37.1 C)  TempSrc: Oral  SpO2: 100%  Weight: 174 lb 1.6 oz (79 kg)   General:Well-appearing, pleasant, In NAD Cardiac: RRR, no murmurs rubs or gallops. Respiratory: Normal work of breathing on room air, CTAB Abdominal: Soft, nontender, nondistended Psych: normal mood and affect, thoughts congruent, no pressured speech  Assessment & Plan:   Moderate episode of recurrent major depressive disorder About a month ago, he noticed that the effexor was actually making him more depressed. He contacted his psychiatrist and an effexor taper was planned, but he lost insurance and couldn't fill his effxor so he effectively quit without taper. He did have withdrawal symptoms. He is actually doing well now because his social situation is better, but PHQ9 is still 12 and he still believes he could benefit from medication therapy. He has also previously been worked up for bipolar, specifically bipolar 2, but he is not sure if he was formally diagnosed with this.  He denies SI and HI, A/V/perceptual disturbances given the complexity of his psychiatric comorbidities, as well as his possible prior workup for bipolar disorder, I am hesitant to start new medication management clinic today.  He does follow-up with psychiatry and I encouraged him to make a close follow-up appointment with to reestablish on medical therapy in addition to CBT.  Of note, he has trialed and failed multiple medications.  He might be a  candidate for ECT or ketamine therapy.  I will leave this to shared decision making with my psych colleagues as well.  GAD (generalized anxiety disorder) See MDD  Erectile dysfunction Reports improvement of symptoms with improvement of comorbid psychiatric conditions.  He still occasionally does have erectile dysfunction.  Will refill supply of sildenafil at this time.  Nicotine dependence with withdrawal Down to smoking about 2 to 3 cigarettes a day.  Working on cessation with nicotine patches and gum.  We discussed Chantix, but I do not think it would be a good time to start this medication given he can exacerbate existing psychiatric symptoms/cause nightmares.  He is also previously failed a trial of Wellbutrin.  Will continue to follow up and encourage smoking cessation.   Patient discussed with Dr. Mayford Knife

## 2022-12-20 NOTE — Assessment & Plan Note (Signed)
Reports improvement of symptoms with improvement of comorbid psychiatric conditions.  He still occasionally does have erectile dysfunction.  Will refill supply of sildenafil at this time.

## 2022-12-20 NOTE — Assessment & Plan Note (Signed)
See MDD

## 2022-12-21 LAB — LIPID PANEL
Chol/HDL Ratio: 3.2 ratio (ref 0.0–5.0)
Cholesterol, Total: 184 mg/dL (ref 100–199)
HDL: 57 mg/dL (ref >39)
LDL Chol Calc (NIH): 111 mg/dL — ABNORMAL HIGH (ref 0–99)
Triglycerides: 89 mg/dL (ref 0–149)
VLDL Cholesterol Cal: 16 mg/dL (ref 5–40)

## 2022-12-21 LAB — HEMOGLOBIN A1C
Est. average glucose Bld gHb Est-mCnc: 120 mg/dL
Hgb A1c MFr Bld: 5.8 % — ABNORMAL HIGH (ref 4.8–5.6)

## 2022-12-23 NOTE — Progress Notes (Signed)
Internal Medicine Clinic Attending  Case discussed with Dr. Sridharan  At the time of the visit.  We reviewed the resident's history and exam and pertinent patient test results.  I agree with the assessment, diagnosis, and plan of care documented in the resident's note.  

## 2023-01-03 ENCOUNTER — Ambulatory Visit (INDEPENDENT_AMBULATORY_CARE_PROVIDER_SITE_OTHER): Payer: Medicaid Other | Admitting: Licensed Clinical Social Worker

## 2023-01-03 DIAGNOSIS — F411 Generalized anxiety disorder: Secondary | ICD-10-CM

## 2023-01-03 DIAGNOSIS — F331 Major depressive disorder, recurrent, moderate: Secondary | ICD-10-CM | POA: Diagnosis not present

## 2023-01-03 NOTE — Progress Notes (Signed)
THERAPIST PROGRESS NOTE  Session Time: 77   Participation Level: Active  Behavioral Response: CasualAlertAnxious and Depressed  Type of Therapy: Individual Therapy  Treatment Goals addressed:  Active     Anxiety Disorder CCP Problem  1 GAD      LTG: Patient will score less than 5 on the Generalized Anxiety Disorder 7 Scale (GAD-7) (Not Progressing)     Start:  04/06/22    Expected End:  04/14/23         STG: Patient will attend at least 80% of scheduled group psychotherapy sessions (Progressing)     Start:  04/06/22    Expected End:  04/14/23         STG: Patient will complete at least 80% of assigned homework (Progressing)     Start:  04/06/22    Expected End:  04/14/23         STG: Patient will practice problem solving skills 3 times per week for the next 4 weeks (Progressing)     Start:  04/06/22    Expected End:  04/14/23           Depression CCP Problem  1 MDD       Identify three triggers for depression  (Progressing)     Start:  04/06/22    Expected End:  04/14/23       Goal Note     Transportation          create 3 future goals  (Progressing)     Start:  04/06/22    Expected End:  04/14/23       Goal Note     Full time employment          Decrease PHQ-9 below 10  (Completed/Met)     Start:  04/06/22    Expected End:  10/05/22    Resolved:  09/07/22    Goal Note     Below a 10 x 2 in last 3 months          STG: Scott Gordon WILL PARTICIPATE IN AT LEAST 80% OF SCHEDULED INDIVIDUAL PSYCHOTHERAPY SESSIONS (Progressing)     Start:  04/06/22    Expected End:  04/14/23         STG: Scott Gordon WILL COMPLETE AT LEAST 80% OF ASSIGNED HOMEWORK (Progressing)     Start:  04/06/22    Expected End:  04/14/23          Journal 2 x weekly  (Progressing)     Start:  04/06/22    Expected End:  04/14/23             No "No call no shows" in 1 year time frame  (Progressing)     Start:  04/06/22    Expected End:  04/14/23                ProgressTowards Goals: Progressing  Interventions: CBT, Motivational Interviewing, and Supportive  Summary: Scott Gordon is a 32 y.o. male who presents with depressed and anxious mood\affect.  Patient was pleasant, cooperative, maintained good eye contact.  Scott Gordon was alert and oriented x 5.  Patient reports primary stressors as transportation, financials, and medication management.  Patient reports that he has been unable to get a hold of the medication provider and missed his last appointment due to scheduling conflict with his new job.  Patient reports that he now has a new appointment on Jan 26, 2023 and has notified his work that he needs a day off.  Patient would like to discuss further his anxiety medication for tension, worry, and restlessness.   Other stressors are transportation as recently he has been moving his car to one of his significant others.  Patient reports that his significant other is not allowed to park in their apartment complex due to the fact that the car not being in their name.  Patient reports he now has to wake up at 8 AM and get their significant other to work then get himself to work, and then has to go and pick up his significant other drop them off and go back home.  Patient reports that he is attempting to keep an open line of communication on 1 there is significant other will be able to afford transportation of their own.   Scott Gordon also reports a desire to talk about previous traumas but states that he still wants to focus on getting his medications under control as well as coming up with a plan for their significant other to gain transportation.  Suicidal/Homicidal: Nowithout intent/plan  Therapist Response:    Interventions/Plan: LCSW used motivational interviewing for open-ended questions, reflective listening, positive affirmations.  LCSW CBT for reframing.  LCSW used psychoanalytic therapy for patient to express thoughts, feelings and emotions.   Plan for patient is to discuss budget with his significant other for timeline for them to gain transportation.  Patient also to discuss concerns with medication management team on May 23.   Plan: Return again in 3 weeks.  Diagnosis: Moderate episode of recurrent major depressive disorder (HCC)  GAD (generalized anxiety disorder)  Collaboration of Care: Other None today   Patient/Guardian was advised Release of Information must be obtained prior to any record release in order to collaborate their care with an outside provider. Patient/Guardian was advised if they have not already done so to contact the registration department to sign all necessary forms in order for [place] to release information regarding their care.   Consent: Patient/Guardian gives verbal consent for treatment and assignment of benefits for services provided during this visit. Patient/Guardian expressed understanding and agreed to proceed.   Weber Cooks, LCSW 01/03/2023

## 2023-01-04 ENCOUNTER — Other Ambulatory Visit: Payer: Self-pay

## 2023-01-17 ENCOUNTER — Ambulatory Visit (HOSPITAL_COMMUNITY): Payer: Medicaid Other | Admitting: Licensed Clinical Social Worker

## 2023-01-24 ENCOUNTER — Other Ambulatory Visit: Payer: Self-pay

## 2023-01-26 ENCOUNTER — Encounter (HOSPITAL_COMMUNITY): Payer: Self-pay | Admitting: Student

## 2023-01-26 ENCOUNTER — Ambulatory Visit (INDEPENDENT_AMBULATORY_CARE_PROVIDER_SITE_OTHER): Payer: Medicaid Other | Admitting: Student

## 2023-01-26 ENCOUNTER — Other Ambulatory Visit: Payer: Self-pay

## 2023-01-26 VITALS — BP 116/73 | HR 83 | Wt 167.4 lb

## 2023-01-26 DIAGNOSIS — F331 Major depressive disorder, recurrent, moderate: Secondary | ICD-10-CM

## 2023-01-26 DIAGNOSIS — F411 Generalized anxiety disorder: Secondary | ICD-10-CM

## 2023-01-26 DIAGNOSIS — F341 Dysthymic disorder: Secondary | ICD-10-CM | POA: Diagnosis not present

## 2023-01-26 DIAGNOSIS — F1220 Cannabis dependence, uncomplicated: Secondary | ICD-10-CM | POA: Diagnosis not present

## 2023-01-26 DIAGNOSIS — F17203 Nicotine dependence unspecified, with withdrawal: Secondary | ICD-10-CM

## 2023-01-26 MED ORDER — NICOTINE 21 MG/24HR TD PT24
21.0000 mg | MEDICATED_PATCH | Freq: Every day | TRANSDERMAL | 1 refills | Status: DC
Start: 2023-01-26 — End: 2023-03-07
  Filled 2023-01-26: qty 28, 28d supply, fill #0

## 2023-01-26 MED ORDER — NICOTINE POLACRILEX 4 MG MT GUM
4.0000 mg | CHEWING_GUM | OROMUCOSAL | 0 refills | Status: DC | PRN
Start: 2023-01-26 — End: 2023-03-07
  Filled 2023-01-26: qty 100, 30d supply, fill #0

## 2023-01-26 MED ORDER — FLUOXETINE HCL 20 MG PO CAPS
20.0000 mg | ORAL_CAPSULE | Freq: Every day | ORAL | 1 refills | Status: DC
Start: 2023-01-26 — End: 2023-03-07
  Filled 2023-01-26: qty 30, 30d supply, fill #0
  Filled 2023-02-27: qty 30, 30d supply, fill #1

## 2023-01-26 NOTE — Progress Notes (Cosign Needed)
BH MD Outpatient Progress Note  01/26/2023 3:51 PM AGNEW ZINCK  MRN:  [id number]  Assessment:  Scott Gordon presents for follow-up evaluation in-person. 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      Plan: # Persistent Depressive Disorder Past medication trials:  wellbutrin (migraine, memory loss), fluoxetine (ineffective), duloxetine (overflow incontinence), venlafaxine (ineffective) Status of problem: active Interventions: --Has discontinued Effexor since end of 11/2022 --START fluoxetine 20 mg daily with plan to titrate upwards as needed as well as possible adjunct with antipsychotic if depression still not well addressed --Ordered labs to rule out organic etiologies of depression including vitamin deficiency, hypothyroidism, electrolyte derangement, anemia --Discussed TMS as possible option in conjunction with antidepressant treatment.  --Alternative options discussed: ECT, ketamine, TCA   # Generalized Anxiety Disorder Past medication trials: hydroxyzine (ineffective) Status of problem: Improving Interventions: -- Continue gabapentin 300 bid for anxiety symptoms -- Fluoxetine as above   # Nicotine Use Disorder, improving -Continue nicotine patch 21 mg -Increase nicotine gum to 4 mg PRN   #Cannabis Use -Advise cessation   #History of ADHD Unclear if meets criteria versus inattention and hyperactivity secondary to anxiety Past medication trials: Adderall (urinary retention), strattera (urinary retention) Status of problem: Stable Interventions -Continue to  monitor symptoms  Patient was given contact information for behavioral health clinic and was instructed to call 911 for emergencies.   Subjective:  Chief Complaint:  Chief Complaint  Patient presents with   Depression   Anxiety    Interval History:  Patient reports interval history since I last spoke with him in March.  He reports that despite the plan to taper off Effexor, he was unable to afford to lower dose of Effexor so he self discontinued without any taper.  He reports that he did experience some discontinuation symptoms but they largely had resolved after a week.  He reports he about 1 week after, he started a new job which provided him a new source of motivation and energy.  He reports that for the first few weeks of working, he did not experience any symptoms of depression and his anxiety was well contorlled..  However, for the past 2 to 3 weeks, he noticed that his mood has been more depressed and he has been more irritable.  He reports that his energy and motivation have started to decrease as well.  We discussed options to better manage his symptoms of depression.  We also discussed that his symptoms are more consistent at this time with persistent depressive disorder as opposed to major depressive disorder.  He seemed to resonate more with persistent depressive disorder than MDD.  We discussed options for treatment and he was willing to restart Prozac with the plan to possibly go up as needed as well as augment with an alternate agent if fluoxetine proved somewhat effective but did not allow for full remission of persistent depressive disorder.  He was also interested in considering TMS once he was able to obtain insurance. He agreed with current medication plan for management of his depression.  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  He reports that his anxiety has  been fluctuating but this also could be secondary to him being inconsistent with his gabapentin. He states when he does take the gabapentin, it will manage ~60% of his anxiety. He does state that the nicotine gum and patch had been helpful with abstaining from cigarette use.  However, he does state that he would prefer if the Nicorette gum was at a higher dose so nicotine gum was increased to 4 mg.  He reports that he will try to be more consistent with his gabapentin usage.  He reports his cannabis use is once every few days when he is playing video games at home.  Patient reports being to be at least 6 hours/day especially given that he lifts heavy objects working at [employer]. He feels relatively well rested. He reports appetite had initially increased but felt it has gone down since becoming more depressed.  Visit Diagnosis:    ICD-10-CM   1. Persistent depressive disorder  F34.1 Fe+TIBC+Fer    Vitamin D (25 hydroxy)    2. Marijuana dependence (HCC)  F12.20     3. GAD (generalized anxiety disorder)  F41.1 FLUoxetine (PROZAC) 20 MG capsule    4. Moderate episode of recurrent major depressive disorder (HCC)  F33.1     5. Nicotine dependence with withdrawal, unspecified nicotine product type  F17.203 nicotine polacrilex (NICORETTE) 4 MG gum    nicotine (NICODERM CQ) 21 mg/24hr patch      Past Medical History:  Past Medical History:  Diagnosis Date   ADHD, predominantly inattentive type    Anxiety    Depression    Nicotine dependence     Past Surgical History:  Procedure Laterality Date   WISDOM TOOTH EXTRACTION      Family History:  Family History  Problem Relation Age of Onset   Diabetes Mother    Non-Hodgkin's lymphoma Mother    Hypertension Father    Diabetes Father    Bone cancer Paternal Aunt     Social History:  Social History   Socioeconomic History   Marital status: Significant Other    Spouse name: Not on file   Number of children: Not on file   Years of  education: Not on file   Highest education level: Not on file  Occupational History   Not on file  Tobacco Use   Smoking status: Every Day    Packs/day: 0.25    Years: 3.00    Additional pack years: 0.00    Total pack years: 0.75    Types: Cigarettes   Smokeless tobacco: Never   Tobacco comments:    Previously used vapes, switched to cigarettes about 3 years ago due to cost.  Vaping Use   Vaping Use: Former   Substances: Nicotine, Flavoring  Substance and Sexual Activity   Alcohol use: Yes    Comment: social alcohol use   Drug use: Yes    Types: Marijuana   Sexual activity: Yes    Birth control/protection: Condom  Other Topics Concern   Not on file  Social History Narrative   Lives with mother and sister. He is polyamorous and has 2 significant others. Currently unemployed, in school for IT. Independent in ADLs and IADLs.    Social Determinants of Health   Financial Resource Strain: Low Risk  (04/06/2022)   Overall Financial Resource Strain (CARDIA)    Difficulty of Paying Living Expenses: Not hard at all  Food Insecurity: No Food Insecurity (06/03/2020)   Hunger Vital Sign    Worried About Running  Out of Food in the Last Year: Never true    Ran Out of Food in the Last Year: Never true  Transportation Needs: No Transportation Needs (06/03/2020)   PRAPARE - Administrator, Civil Service (Medical): No    Lack of Transportation (Non-Medical): No  Physical Activity: Sufficiently Active (04/06/2022)   Exercise Vital Sign    Days of Exercise per Week: 3 days    Minutes of Exercise per Session: 90 min  Stress: Stress Concern Present (04/06/2022)   [employer] of Occupational Health - Occupational Stress Questionnaire    Feeling of Stress : Very much  Social Connections: Socially Isolated (04/06/2022)   Social Connection and Isolation Panel [NHANES]    Frequency of Communication with Friends and Family: Never    Frequency of Social Gatherings with Friends and  Family: More than three times a week    Attends Religious Services: Never    Database administrator or Organizations: No    Attends Banker Meetings: Never    Marital Status: Never married    Allergies:  Allergies  Allergen Reactions   Pineapple Hives    Current Medications: Current Outpatient Medications  Medication Sig Dispense Refill   FLUoxetine (PROZAC) 20 MG capsule Take 1 capsule (20 mg total) by mouth daily. 30 capsule 1   nicotine polacrilex (NICORETTE) 4 MG gum Take 1 each (4 mg total) by mouth as needed for smoking cessation. 100 tablet 0   gabapentin (NEURONTIN) 300 MG capsule Take 1 capsule (300 mg total) by mouth 2 (two) times daily. 120 capsule 0   nicotine (NICODERM CQ) 21 mg/24hr patch Place 1 patch (21 mg total) onto the skin daily. 28 patch 1   sildenafil (VIAGRA) 100 MG tablet Take 1 tablet (100 mg total) by mouth daily as needed for erectile dysfunction. 10 tablet 2   No current facility-administered medications for this visit.    ROS: Review of Systems  Objective:  Psychiatric Specialty Exam: Blood pressure 116/73, pulse 83, weight 167 lb 6.4 oz (75.9 kg).Body mass index is 26.22 kg/m.  General Appearance: Fairly Groomed  Eye Contact:  Good  Speech:  Clear and Coherent and Normal Rate  Volume:  Normal  Mood:  Euthymic  Affect:  Appropriate and Congruent  Thought Process:  Coherent, Goal Directed, and Linear  Orientation:  Full (Time, Place, and Person)  Thought Content: Logical   Suicidal Thoughts:  No  Homicidal Thoughts:  No  Memory:  Remote;   Good  Judgment:  Fair  Insight:  Fair  Psychomotor Activity:  Normal  Concentration:  Concentration: Good and Attention Span: Good              Assets:  Communication Skills Desire for Improvement Financial Resources/Insurance Housing Intimacy Leisure Time Physical Health Resilience Social Support Talents/Skills Transportation Vocational/Educational  ADL's:  Intact   Cognition: WNL  Sleep:  Good   PE: General: well-appearing; no acute distress  Pulm: no increased work of breathing on room air  Strength & Muscle Tone: within normal limits Neuro: no focal neurological deficits observed  Gait & Station: normal  Metabolic Disorder Labs: Lab Results  Component Value Date   HGBA1C 5.8 (H) 12/19/2022   MPG 108 09/28/2021   No results found for: "PROLACTIN" Lab Results  Component Value Date   CHOL 184 12/19/2022   TRIG 89 12/19/2022   HDL 57 12/19/2022   CHOLHDL 3.2 12/19/2022   VLDL 6 09/28/2021   LDLCALC 111 (H) 12/19/2022  LDLCALC 89 09/28/2021   Lab Results  Component Value Date   TSH 0.700 09/28/2021   TSH 0.982 03/23/2021    Therapeutic Level Labs: No results found for: "LITHIUM" No results found for: "VALPROATE" No results found for: "CBMZ"  Screenings: AUDIT    Flowsheet Row Counselor from 06/03/2020 in [facility]  Alcohol Use Disorder Identification Test Final Score (AUDIT) 4      GAD-7    Flowsheet Row Office Visit from 12/19/2022 in [facility] Counselor from 11/23/2022 in [facility] Counselor from 09/07/2022 in [facility] Counselor from 06/08/2022 in [facility] Counselor from 04/26/2022 in [facility]  Total GAD-7 Score 10 13 8 6 10       PHQ2-9    Flowsheet Row Office Visit from 12/19/2022 in [facility] Counselor from 11/23/2022 in [facility] Clinical Support from 10/27/2022 in [facility] Counselor from 09/07/2022 in [facility] Counselor from 06/08/2022 in [facility]  PHQ-2 Total Score 2 4 3 2 2   PHQ-9 Total Score 12 19 12 7 8       Flowsheet Row Counselor from 11/23/2022 in [facility] Counselor from 06/08/2022 in [facility] Office Visit from 04/26/2022 in [facility]  C-SSRS RISK CATEGORY Low Risk Low Risk Low Risk       Collaboration of Care: Collaboration of Care:   Patient/Guardian was advised Release of Information must be obtained prior to any record release in order to collaborate their care with an outside provider. Patient/Guardian was advised if they have not already done so to contact the registration department to sign all necessary forms in order for [place] to release information regarding their care.   Consent: Patient/Guardian gives verbal consent for treatment and assignment of benefits for services provided during this visit. Patient/Guardian expressed understanding and agreed to proceed.   A total of 35 minutes was spent involved in face to face clinical care, chart review, and documentation.   Park Pope, MD 01/26/2023, 3:51 PM

## 2023-01-27 ENCOUNTER — Other Ambulatory Visit: Payer: Self-pay

## 2023-02-06 ENCOUNTER — Encounter: Payer: Self-pay | Admitting: *Deleted

## 2023-02-08 ENCOUNTER — Ambulatory Visit (HOSPITAL_COMMUNITY): Payer: Medicaid Other | Admitting: Licensed Clinical Social Worker

## 2023-02-22 ENCOUNTER — Ambulatory Visit (HOSPITAL_COMMUNITY): Payer: Self-pay | Admitting: Licensed Clinical Social Worker

## 2023-02-22 ENCOUNTER — Other Ambulatory Visit (HOSPITAL_COMMUNITY): Payer: Self-pay

## 2023-02-27 ENCOUNTER — Other Ambulatory Visit (HOSPITAL_COMMUNITY): Payer: Self-pay

## 2023-02-27 ENCOUNTER — Other Ambulatory Visit: Payer: Self-pay

## 2023-03-05 NOTE — Progress Notes (Signed)
BH MD Outpatient Progress Note  03/07/2023 1:15 PM Scott Gordon  MRN:  [id number]  Assessment:  Scott Gordon presents for follow-up evaluation in-person. Today, 03/07/23, patient reports worsening anxiety after initiation of fluoxetine.  He also ran out of gabapentin this past week and anxiety noticeably worsened. He does reports since being back on gabapentin yesterday that he noticed significant benefit so plan to increase gabapentin to 300 mg 3x/day. He will also be referred for TMS to assess whether he would qualify given his treatment resistant depression. Plan for follow up in 2 months with me at [facility] clinic given he started to receive insurance through work.   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 for medication management.      Plan: # Persistent Depressive Disorder Past medication trials:  wellbutrin (migraine, memory loss), fluoxetine (ineffective), duloxetine (overflow incontinence), venlafaxine (ineffective) Status of problem: active Interventions: --Continue fluoxetine 20 mg daily with plan to titrate upwards as needed as well as possible adjunct with antipsychotic if depression still not well addressed --Ordered labs to rule out organic etiologies of depression including vitamin deficiency, hypothyroidism, electrolyte derangement, anemia  -Will need a new lab appointment to get lab draw --Referral to TMS. Patient to call TMS coordinator when obtain insurance card  --Alternative options discussed: ECT, ketamine, TCA     # Generalized Anxiety Disorder Past medication trials: hydroxyzine (ineffective) Status of problem: Worsened Interventions: -- INCREASE gabapentin 300  from BID to TID for anxiety symptoms -- Fluoxetine as above   # Nicotine Use Disorder -Continue nicotine patch 21 mg daily  -Continue nicotine gum 4 mg PRN   #Cannabis Use -Advise cessation   #History of ADHD Unclear if meets criteria versus inattention and hyperactivity secondary to anxiety Past medication trials: Adderall (urinary retention), strattera (urinary retention) Status of problem: Stable Interventions -Continue to monitor symptoms  Patient was given contact information for behavioral health clinic and was instructed to call 911 for emergencies.   Subjective:  Chief Complaint:  Chief Complaint  Patient presents with   Medication Management    Interval History:  Patient reports past month has been "rough" characterized by worsening anxiety. He reports this started ~2-3 days after starting fluoxetine but anticipated this given this was a reintroduction of SSRI and he feels this is the nature of his body adjusting to medications. He reports this worsening anxiety has cascaded into worsening sleep, increase in cannabis use (from every other day to daily use), increase in cigarette use, and poor appetite. This was further exacerbated last week when he ran out of gabapentin. He reports that since getting gabapentin refilled yesterday, his anxiety has noticeably improved.  He reports his depression has persisted but has not worsened since last visit. He reports occasional passive SI but is able to contract for safety. He denies HI/AVH. 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   We discussed referral to TMS and he was agreeable to reaching out to coordinator once he gets his insurance card. We have increased gabapentin to better account for worsening anxiety. We discussed we will adjust fluoxetine dosage next visit once we have identified that anxiety is better controlled and identified if TMS has  been effective or at least progress of starting  TMS.    Visit Diagnosis:    ICD-10-CM   1. GAD (generalized anxiety disorder)  F41.1 FLUoxetine (PROZAC) 20 MG capsule    gabapentin (NEURONTIN) 300 MG capsule    2. Persistent depressive disorder  F34.1 Ambulatory referral to TMS    3. Nicotine dependence with withdrawal, unspecified nicotine product type  F17.203 nicotine (NICODERM CQ) 21 mg/24hr patch    nicotine polacrilex (NICORETTE) 4 MG gum    4. Marijuana dependence (HCC)  F12.20       Past Medical History:  Past Medical History:  Diagnosis Date   ADHD, predominantly inattentive type    Anxiety    Depression    Nicotine dependence     Past Surgical History:  Procedure Laterality Date   WISDOM TOOTH EXTRACTION      Family History:  Family History  Problem Relation Age of Onset   Diabetes Mother    Non-Hodgkin's lymphoma Mother    Hypertension Father    Diabetes Father    Bone cancer Paternal Aunt     Social History:  Social History   Socioeconomic History   Marital status: Significant Other    Spouse name: Not on file   Number of children: Not on file   Years of education: Not on file   Highest education level: Not on file  Occupational History   Not on file  Tobacco Use   Smoking status: Every Day    Packs/day: 0.25    Years: 3.00    Additional pack years: 0.00    Total pack years: 0.75    Types: Cigarettes   Smokeless tobacco: Never   Tobacco comments:    Previously used vapes, switched to cigarettes about 3 years ago due to cost.  Vaping Use   Vaping Use: Former   Substances: Nicotine, Flavoring  Substance and Sexual Activity   Alcohol use: Yes    Comment: social alcohol use   Drug use: Yes    Types: Marijuana   Sexual activity: Yes    Birth control/protection: Condom  Other Topics Concern   Not on file  Social History Narrative   Lives with mother and sister. He is polyamorous and has 2 significant others. Currently unemployed, in  school for IT. Independent in ADLs and IADLs.    Social Determinants of Health   Financial Resource Strain: Low Risk  (04/06/2022)   Overall Financial Resource Strain (CARDIA)    Difficulty of Paying Living Expenses: Not hard at all  Food Insecurity: No Food Insecurity (06/03/2020)   Hunger Vital Sign    Worried About Running Out of Food in the Last Year: Never true    Ran Out of Food in the Last Year: Never true  Transportation Needs: No Transportation Needs (06/03/2020)   PRAPARE - Administrator, Civil Service (Medical): No    Lack of Transportation (Non-Medical): No  Physical Activity: Sufficiently Active (04/06/2022)   Exercise Vital Sign    Days of Exercise per Week: 3 days    Minutes of Exercise per Session: 90 min  Stress: Stress Concern Present (04/06/2022)   [employer] of Occupational Health - Occupational Stress Questionnaire    Feeling of Stress : Very much  Social Connections: Socially Isolated (04/06/2022)   Social Connection and Isolation Panel [NHANES]    Frequency of Communication with Friends and Family: Never    Frequency of Social Gatherings with Friends and Family: More than three times a week    Attends Religious Services: Never  Active Member of Clubs or Organizations: No    Attends Banker Meetings: Never    Marital Status: Never married    Allergies:  Allergies  Allergen Reactions   Pineapple Hives    Current Medications: Current Outpatient Medications  Medication Sig Dispense Refill   FLUoxetine (PROZAC) 20 MG capsule Take 1 capsule (20 mg total) by mouth daily. 30 capsule 1   gabapentin (NEURONTIN) 300 MG capsule Take 1 capsule (300 mg total) by mouth 3 (three) times daily. 90 capsule 2   nicotine (NICODERM CQ) 21 mg/24hr patch Place 1 patch (21 mg total) onto the skin daily. 28 patch 1   nicotine polacrilex (NICORETTE) 4 MG gum Take 1 each (4 mg total) by mouth as needed for smoking cessation. 100 tablet 0   sildenafil  (VIAGRA) 100 MG tablet Take 1 tablet (100 mg total) by mouth daily as needed for erectile dysfunction. 10 tablet 2   No current facility-administered medications for this visit.    ROS: Review of Systems  Objective:  Psychiatric Specialty Exam: Blood pressure 124/78, pulse 78, weight 160 lb (72.6 kg).Body mass index is 25.06 kg/m.  General Appearance: Fairly Groomed  Eye Contact:  Good  Speech:  Clear and Coherent and Normal Rate  Volume:  Normal  Mood:  Euthymic  Affect:  Appropriate and Congruent  Thought Process:  Coherent, Goal Directed, and Linear  Orientation:  Full (Time, Place, and Person)  Thought Content: Logical   Suicidal Thoughts:  No  Homicidal Thoughts:  No  Memory:  Remote;   Good  Judgment:  Fair  Insight:  Fair  Psychomotor Activity:  Normal  Concentration:  Concentration: Good and Attention Span: Good              Assets:  Communication Skills Desire for Improvement Financial Resources/Insurance Housing Intimacy Leisure Time Physical Health Resilience Social Support Talents/Skills Transportation Vocational/Educational  ADL's:  Intact  Cognition: WNL  Sleep:  Good   PE: General: well-appearing; no acute distress  Pulm: no increased work of breathing on room air  Strength & Muscle Tone: within normal limits Neuro: no focal neurological deficits observed  Gait & Station: normal  Metabolic Disorder Labs: Lab Results  Component Value Date   HGBA1C 5.8 (H) 12/19/2022   MPG 108 09/28/2021   No results found for: "PROLACTIN" Lab Results  Component Value Date   CHOL 184 12/19/2022   TRIG 89 12/19/2022   HDL 57 12/19/2022   CHOLHDL 3.2 12/19/2022   VLDL 6 09/28/2021   LDLCALC 111 (H) 12/19/2022   LDLCALC 89 09/28/2021   Lab Results  Component Value Date   TSH 0.700 09/28/2021   TSH 0.982 03/23/2021    Therapeutic Level Labs: No results found for: "LITHIUM" No results found for: "VALPROATE" No results found for:  "CBMZ"  Screenings: AUDIT    Flowsheet Row Counselor from 06/03/2020 in [facility]  Alcohol Use Disorder Identification Test Final Score (AUDIT) 4      GAD-7    Flowsheet Row Office Visit from 12/19/2022 in [facility] Counselor from 11/23/2022 in [facility] Counselor from 09/07/2022 in [facility] Counselor from 06/08/2022 in [facility] Counselor from 04/26/2022 in [facility]  Total GAD-7 Score 10 13 8 6 10       PHQ2-9    Flowsheet Row Office Visit from 12/19/2022 in [facility] Counselor from 11/23/2022  in [facility] Clinical Support from 10/27/2022 in [facility] Counselor from 09/07/2022 in [facility] Counselor from 06/08/2022 in [facility]  PHQ-2 Total Score 2 4 3 2 2   PHQ-9 Total Score 12 19 12 7 8       Flowsheet Row Counselor from 11/23/2022 in [facility] Counselor from 06/08/2022 in [facility] Office Visit from 04/26/2022 in [facility]  C-SSRS RISK CATEGORY Low Risk Low Risk Low Risk       Collaboration of Care: Collaboration of Care:   Patient/Guardian was advised Release of Information must be obtained prior to any record release in order to collaborate their care with an outside provider. Patient/Guardian was advised if they have not already done so to contact the registration department to sign all necessary forms in order for [place] to release information regarding their care.   Consent: Patient/Guardian gives verbal consent for treatment and assignment of benefits for services provided during this visit. Patient/Guardian expressed understanding and agreed to proceed.   A total of 35  minutes was spent involved in face to face clinical care, chart review, and documentation.   Park Pope, MD 03/07/2023, 1:15 PM

## 2023-03-06 ENCOUNTER — Other Ambulatory Visit: Payer: Self-pay

## 2023-03-06 ENCOUNTER — Telehealth (HOSPITAL_COMMUNITY): Payer: Self-pay

## 2023-03-06 DIAGNOSIS — F411 Generalized anxiety disorder: Secondary | ICD-10-CM

## 2023-03-06 MED ORDER — GABAPENTIN 300 MG PO CAPS
300.0000 mg | ORAL_CAPSULE | Freq: Two times a day (BID) | ORAL | 0 refills | Status: DC
Start: 2023-03-06 — End: 2023-03-07
  Filled 2023-03-06: qty 60, 30d supply, fill #0

## 2023-03-06 NOTE — Telephone Encounter (Signed)
Notified that patient out of medication. Refilled gabapentin and sent to pharmacy of choice. Per PDMP, last fill 01/04/23 for 30 day script.   Park Pope, MD PGY2 Psychiatry Resident

## 2023-03-06 NOTE — Telephone Encounter (Signed)
Medication refill - Call from pt stating he is out of Gabapentin, has been for approximately 1 week and requests a refill be sent into the [facility]. Pt. aware of appt for 03/07/23 and agreed to send request to Dr. Hazle Quant to see if could be sent in prior to appointment.  Medication last ordered on 10/27/22 for 2 month supply.

## 2023-03-07 ENCOUNTER — Ambulatory Visit (INDEPENDENT_AMBULATORY_CARE_PROVIDER_SITE_OTHER): Payer: MEDICAID | Admitting: Student

## 2023-03-07 ENCOUNTER — Encounter (HOSPITAL_COMMUNITY): Payer: Self-pay | Admitting: Student

## 2023-03-07 ENCOUNTER — Other Ambulatory Visit: Payer: Self-pay

## 2023-03-07 VITALS — BP 124/78 | HR 78 | Wt 160.0 lb

## 2023-03-07 DIAGNOSIS — F411 Generalized anxiety disorder: Secondary | ICD-10-CM

## 2023-03-07 DIAGNOSIS — F1220 Cannabis dependence, uncomplicated: Secondary | ICD-10-CM

## 2023-03-07 DIAGNOSIS — F341 Dysthymic disorder: Secondary | ICD-10-CM

## 2023-03-07 DIAGNOSIS — F17203 Nicotine dependence unspecified, with withdrawal: Secondary | ICD-10-CM | POA: Diagnosis not present

## 2023-03-07 MED ORDER — GABAPENTIN 300 MG PO CAPS
300.0000 mg | ORAL_CAPSULE | Freq: Three times a day (TID) | ORAL | 2 refills | Status: DC
Start: 2023-03-07 — End: 2023-05-26
  Filled 2023-03-07 – 2023-03-27 (×2): qty 90, 30d supply, fill #0
  Filled 2023-04-26: qty 90, 30d supply, fill #1

## 2023-03-07 MED ORDER — FLUOXETINE HCL 20 MG PO CAPS
20.0000 mg | ORAL_CAPSULE | Freq: Every day | ORAL | 1 refills | Status: DC
Start: 2023-03-07 — End: 2023-05-26
  Filled 2023-03-07 – 2023-03-27 (×2): qty 30, 30d supply, fill #0
  Filled 2023-04-26: qty 30, 30d supply, fill #1

## 2023-03-07 MED ORDER — NICOTINE POLACRILEX 4 MG MT GUM
4.0000 mg | CHEWING_GUM | OROMUCOSAL | 0 refills | Status: DC | PRN
  Filled 2023-03-07 – 2023-04-26 (×2): qty 100, 30d supply, fill #0

## 2023-03-07 MED ORDER — NICOTINE 21 MG/24HR TD PT24
21.0000 mg | MEDICATED_PATCH | Freq: Every day | TRANSDERMAL | 1 refills | Status: DC
Start: 2023-03-07 — End: 2023-05-26
  Filled 2023-03-07 – 2023-04-26 (×2): qty 28, 28d supply, fill #0

## 2023-03-07 NOTE — Patient Instructions (Signed)
It was a pleasure taking care of you today.  The following adjustments to your medications have been made: Increased gabapentin 300 mg from 2x/day to 3x/day. Please monitor for any daytime sedation at the higher dose.  You have also been referred for TMS. Please reach out to the following coordinator once you have received your insurance card.  Psychologist, sport and exercise ([employer] Coordinator) @ [phone]

## 2023-03-13 ENCOUNTER — Other Ambulatory Visit: Payer: Self-pay

## 2023-03-13 NOTE — Addendum Note (Signed)
Addended by: Theodoro Kos A on: 03/13/2023 05:28 PM   Modules accepted: Level of Service

## 2023-03-27 ENCOUNTER — Other Ambulatory Visit: Payer: Self-pay

## 2023-03-28 ENCOUNTER — Other Ambulatory Visit: Payer: Self-pay

## 2023-03-31 ENCOUNTER — Telehealth (HOSPITAL_COMMUNITY): Payer: Self-pay | Admitting: Student

## 2023-03-31 ENCOUNTER — Other Ambulatory Visit: Payer: Self-pay

## 2023-03-31 DIAGNOSIS — F331 Major depressive disorder, recurrent, moderate: Secondary | ICD-10-CM

## 2023-03-31 MED ORDER — ARIPIPRAZOLE 2 MG PO TABS
2.0000 mg | ORAL_TABLET | Freq: Every day | ORAL | 1 refills | Status: DC
  Filled 2023-03-31: qty 30, 30d supply, fill #0
  Filled 2023-04-26: qty 30, 30d supply, fill #1

## 2023-03-31 NOTE — Telephone Encounter (Addendum)
Called patient after being notified by front desk regarding worsening depression. Patient did not pick up but left VM stating I would call again in afternoon.   Update 4PM Reached patient. Patient reports several fold worsening in depressed mood. He has significant anhedonia, poor appetite, tired all the time, and worsening irritability. Patient denies SI and is able to contract for safety at this time. He would like to start adjunct antipsychotic to aid with his mood so will start aripiprazole at 2 mg. Risks and side effects were discussed with patient including movement abnormalities and metabolic side effects. Patient still wanting to continue with plan.   -Sent in prescription for Abilify 2 mg to pharmacy of choice   Park Pope, MD Psychiatry Resident PGY3

## 2023-04-03 ENCOUNTER — Other Ambulatory Visit: Payer: Self-pay

## 2023-04-14 ENCOUNTER — Other Ambulatory Visit (HOSPITAL_COMMUNITY): Payer: Self-pay

## 2023-04-21 ENCOUNTER — Other Ambulatory Visit: Payer: Self-pay

## 2023-04-21 ENCOUNTER — Other Ambulatory Visit (HOSPITAL_COMMUNITY): Payer: Self-pay

## 2023-04-26 ENCOUNTER — Other Ambulatory Visit: Payer: Self-pay

## 2023-05-17 ENCOUNTER — Ambulatory Visit (HOSPITAL_COMMUNITY): Payer: MEDICAID | Admitting: Student

## 2023-05-19 IMAGING — CT CT ABD-PELV W/ CM
2 of 7 series · 14 of 46 positions shown, 18 images · IV contrast (OMNIPAQUE 350)
Comparison: 06/14/2018

CLINICAL DATA: Abdominal pain with nausea and vomiting

EXAM:
CT ABDOMEN AND PELVIS WITH CONTRAST
TECHNIQUE: Multidetector CT imaging of the abdomen and pelvis was performed
using the standard protocol following bolus administration of
intravenous contrast.
CONTRAST:  80mL OMNIPAQUE IOHEXOL 350 MG/ML SOLN

[Series 3: axial st · axial · 0.68mm/px · z∈[-557,-247]mm · 11 of 73 slices shown, 15 images]
[im 7/73  soft-tissue]
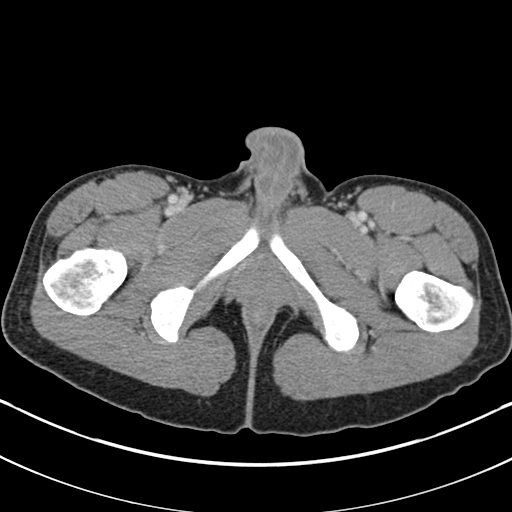
[im 7/73  bone]
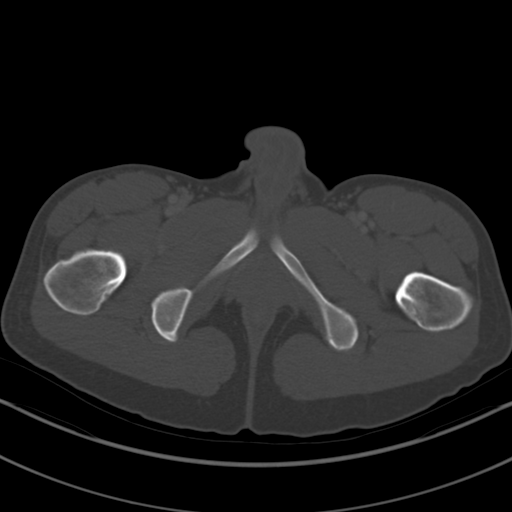
[im 14/73  soft-tissue]
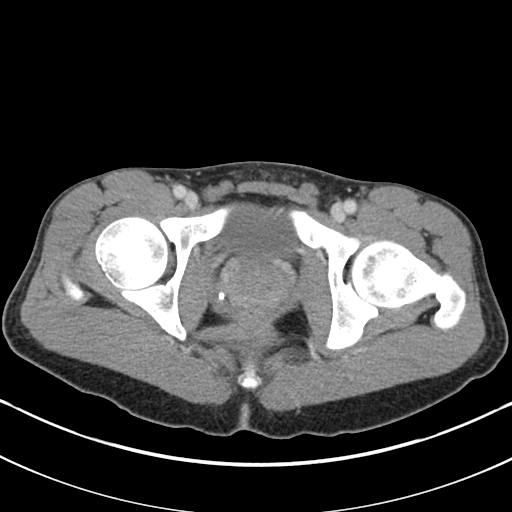
[im 20/73  soft-tissue]
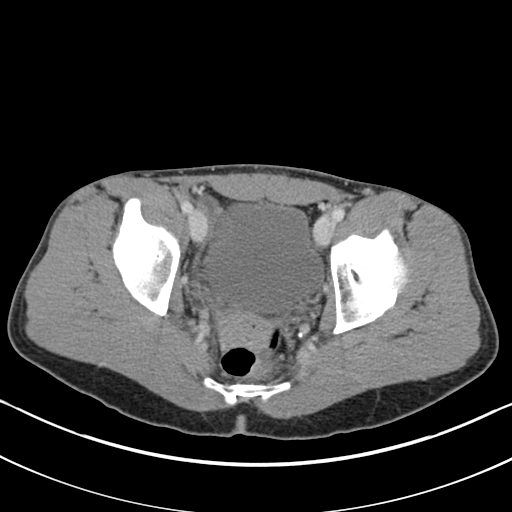
[im 30/73  soft-tissue]
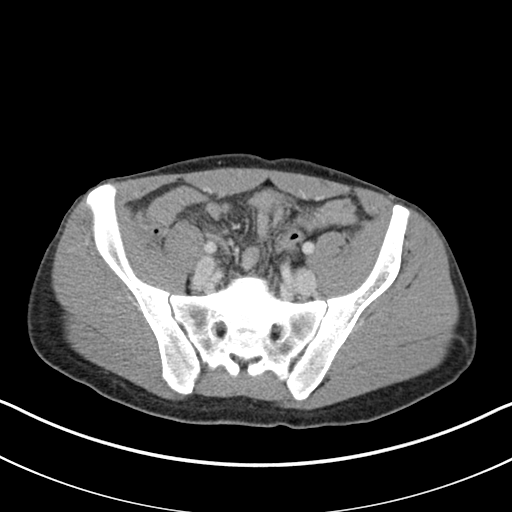
[im 37/73  soft-tissue]
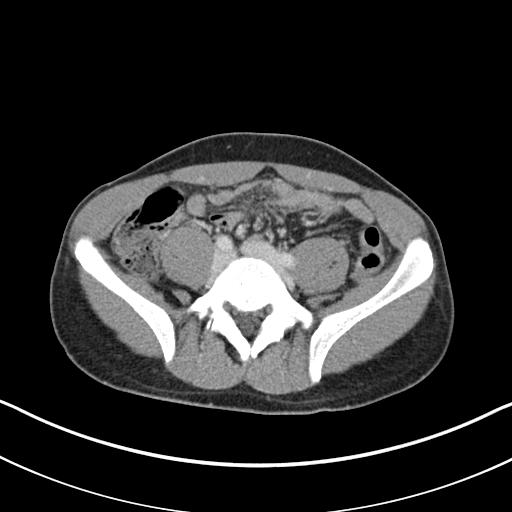
[im 43/73  soft-tissue]
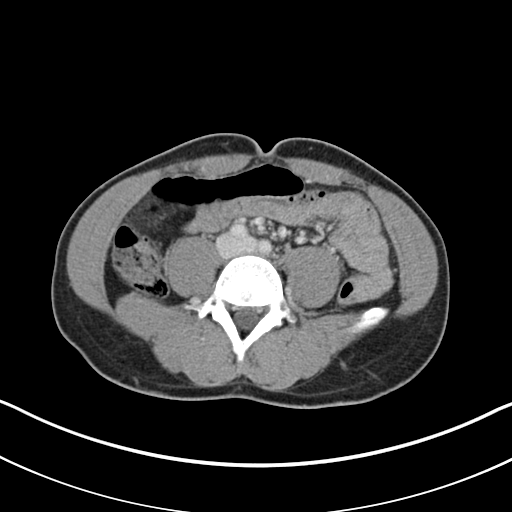
[im 53/73  soft-tissue]
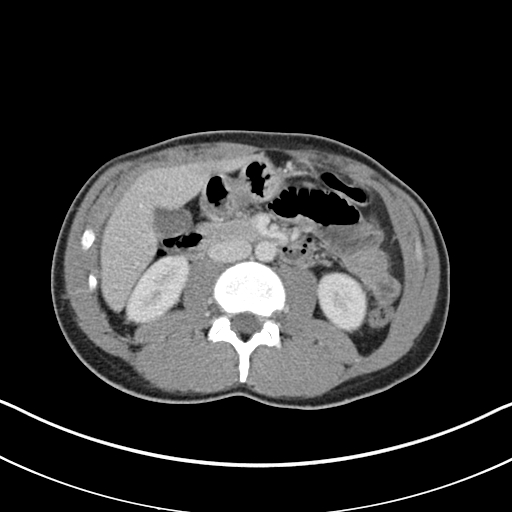
[im 59/73  soft-tissue]
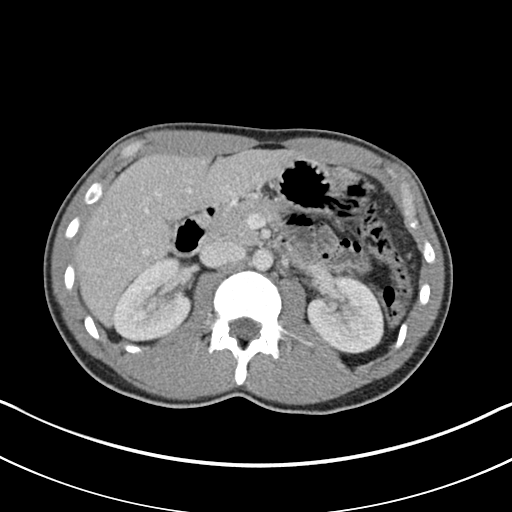
[im 59/73  lung]
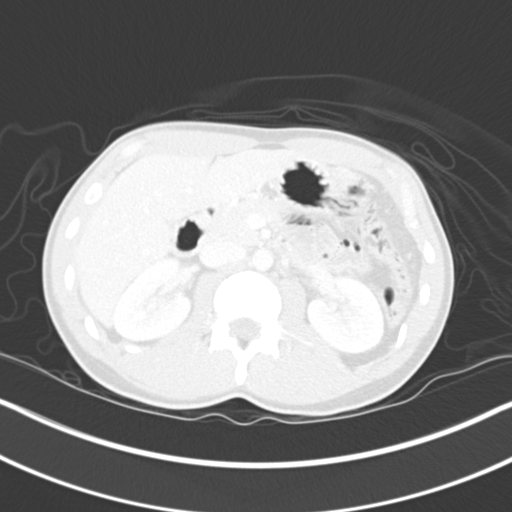
[im 63/73  lung]
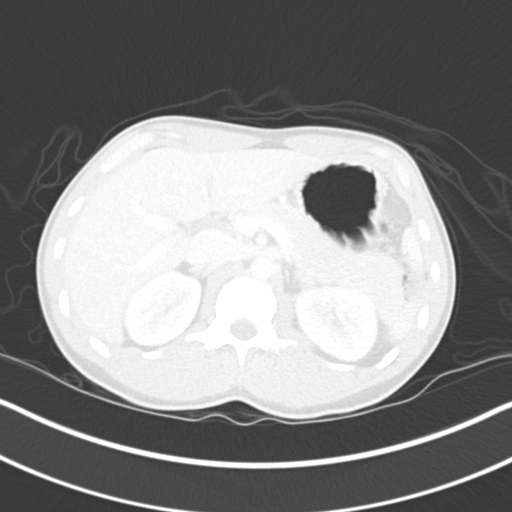
[im 66/73  soft-tissue]
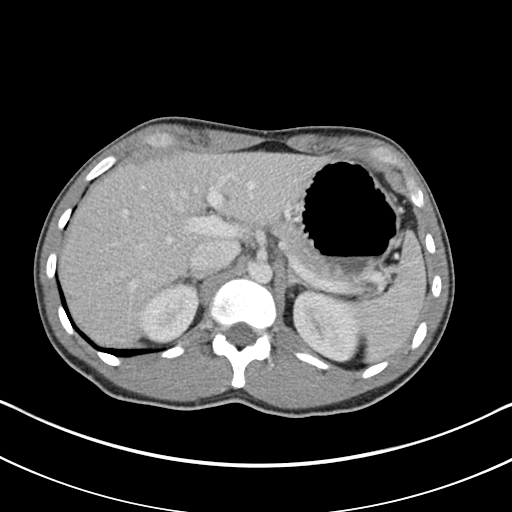
[im 66/73  lung]
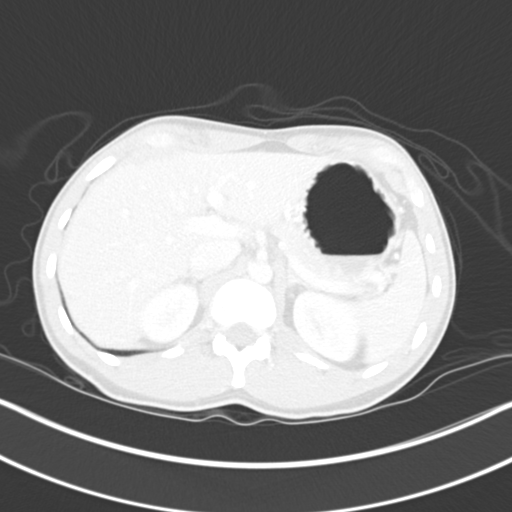
[im 66/73  bone]
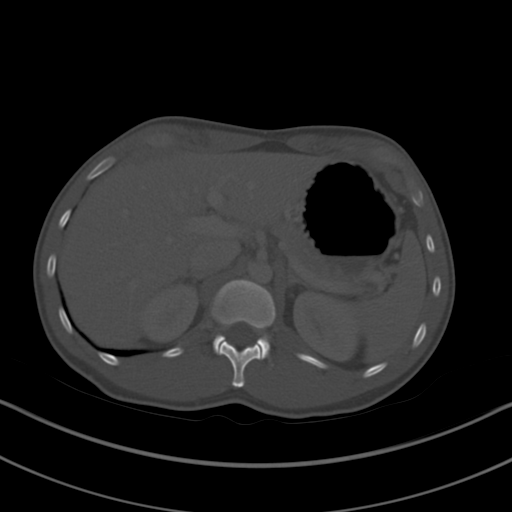
[im 69/73  lung]
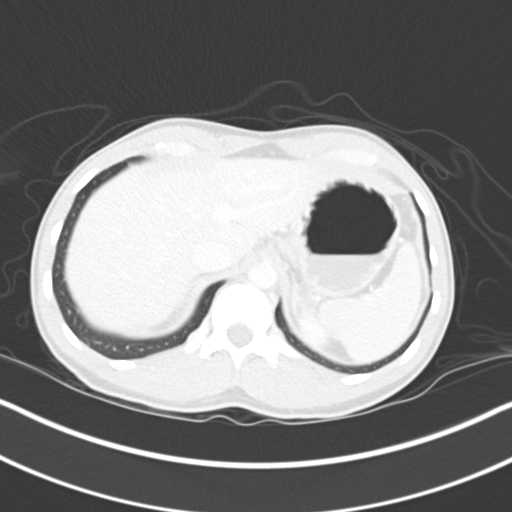

[Series 6: coronal st · coronal · 0.60mm/px · 3 of 103 slices shown]
[im 26/103  soft-tissue]
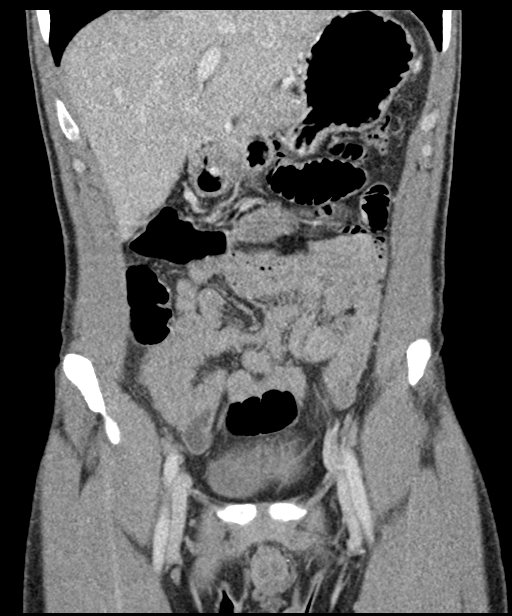
[im 52/103  soft-tissue]
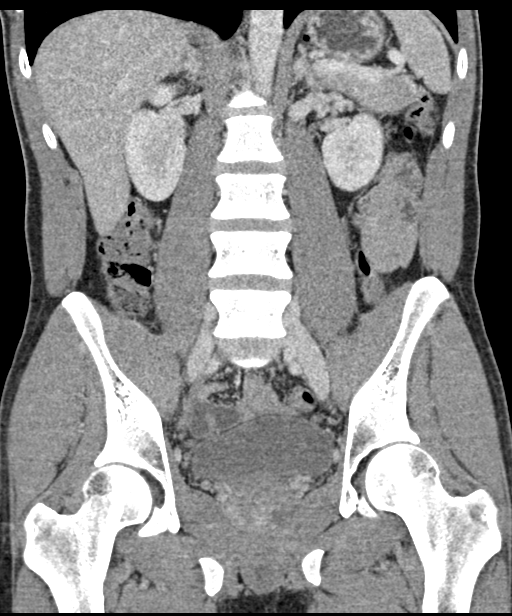
[im 77/103  soft-tissue]
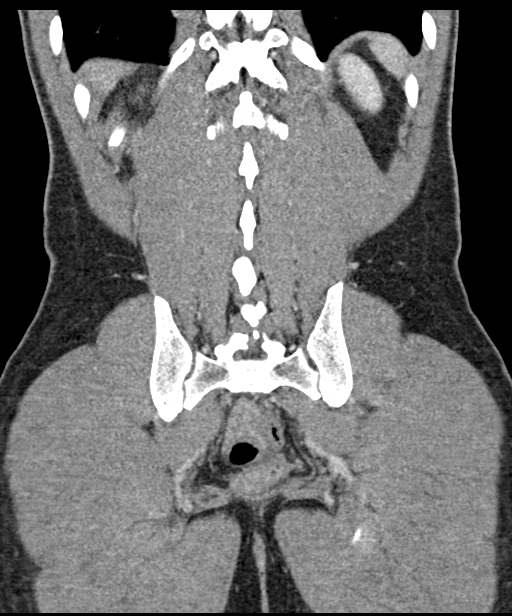

[14 of 46 positions shown; findings below may reference images not displayed]

FINDINGS: Lower chest: No acute abnormality.

Hepatobiliary: Small cyst is noted in the dome of the liver
laterally. No gallstones, gallbladder wall thickening, or biliary
dilatation.

Pancreas: Unremarkable. No pancreatic ductal dilatation or
surrounding inflammatory changes.

Spleen: Normal in size without focal abnormality.

Adrenals/Urinary Tract: Adrenal glands are within normal limits.
Kidneys demonstrate a normal enhancement pattern bilaterally. No
renal calculi or obstructive changes are seen. Ureters are within
normal limits. The bladder is partially distended.

Stomach/Bowel: No obstructive or inflammatory changes of the colon
are seen. The appendix is air-filled and within normal limits. Small
bowel and stomach are within normal limits.

Vascular/Lymphatic: No significant vascular findings are present. No
enlarged abdominal or pelvic lymph nodes.

Reproductive: Prostate is unremarkable.

Other: No abdominal wall hernia or abnormality. No abdominopelvic
ascites.

Musculoskeletal: No acute or significant osseous findings.
IMPRESSION: No acute abnormality is identified correspond with the patient's
given clinical symptomatology.

## 2023-05-20 NOTE — Progress Notes (Cosign Needed Addendum)
BH MD Outpatient Progress Note  05/20/2023 3:27 PM Scott Gordon  MRN:  [id number]  Assessment:  Scott Gordon presents for follow-up evaluation in-person. Today, 05/20/23, patient reports improvement in anxiety following addition of abilify. Plan to increase abilify today to further treat his depressive symptoms.  He continues to be interested in TMS for which he has been referred for.   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      Plan: # Persistent Depressive Disorder Past medication trials:  wellbutrin (migraine, memory loss), fluoxetine (ineffective), duloxetine (overflow incontinence), venlafaxine (ineffective) Status of problem: active Interventions: --Continue fluoxetine 20 mg daily with plan to titrate upwards as needed as well as possible adjunct with antipsychotic if depression still not well addressed --Ordered labs to rule out organic etiologies of depression including vitamin deficiency, hypothyroidism, electrolyte derangement, anemia  -Will need a new lab appointment to get lab draw --Referral to TMS.   --Consider genetic testing given assortment of side effects patient is experiencing regarding psychotropics --Alternative options discussed: ECT, ketamine, TCA     # Generalized Anxiety Disorder Past medication trials: hydroxyzine (ineffective) Status of problem: Worsened Interventions: -- Continue gabapentin 300 mg TID for anxiety symptoms -- Fluoxetine as above   # Nicotine Use Disorder -Continue nicotine patch 21 mg daily  -Continue nicotine gum 4 mg PRN   #Cannabis Use Disorder -Advise cessation   #History of ADHD Unclear if meets  criteria versus inattention and hyperactivity secondary to anxiety Past medication trials: Adderall (urinary retention), strattera (urinary retention) Status of problem: Stable Interventions -Continue to monitor symptoms  Patient was given contact information for behavioral health clinic and was instructed to call 911 for emergencies.   Subjective:  Chief Complaint:  No chief complaint on file.   Interval History:  Patient reports significant improvement in depressive and anxiety related symptoms since initiation of Abilify.  He reports concern that this week he may be going into a depressive episode again but does report that there is a large component of his depression that seems to be attributed to living at his mother's house.  He denies SI/HI/AVH.  Reports stable appetite and sleep.  He continues to take medications as prescribed.  He asked about the TMS referral today and it was advised that this would happen soon. He was amenable to an increase in Abilify dose to 5 mg.  I discussed that we would likely be increasing his fluoxetine dose should he continue to experience worsening symptoms of depression as opposed to increasing the Abilify dose.  He was amenable to plan and all questions during this visit were addressed.   Visit Diagnosis:  No diagnosis found.   Past Medical History:  Past Medical History:  Diagnosis Date   ADHD, predominantly inattentive type    Anxiety    Depression    Nicotine dependence     Past Surgical History:  Procedure Laterality Date   WISDOM TOOTH EXTRACTION      Family History:  Family History  Problem Relation Age of Onset   Diabetes Mother    Non-Hodgkin's lymphoma Mother    Hypertension Father    Diabetes Father    Bone cancer Paternal Aunt     Social History:  Social History   Socioeconomic History   Marital status: Significant Other  Spouse name: Not on file   Number of children: Not on file   Years of education: Not on file    Highest education level: Not on file  Occupational History   Not on file  Tobacco Use   Smoking status: Every Day    Current packs/day: 0.25    Average packs/day: 0.3 packs/day for 3.0 years (0.8 ttl pk-yrs)    Types: Cigarettes   Smokeless tobacco: Never   Tobacco comments:    Previously used vapes, switched to cigarettes about 3 years ago due to cost.  Vaping Use   Vaping status: Former   Substances: Nicotine, Flavoring  Substance and Sexual Activity   Alcohol use: Yes    Comment: social alcohol use   Drug use: Yes    Types: Marijuana   Sexual activity: Yes    Birth control/protection: Condom  Other Topics Concern   Not on file  Social History Narrative   Lives with mother and sister. He is polyamorous and has 2 significant others. Currently unemployed, in school for IT. Independent in ADLs and IADLs.    Social Determinants of Health   Financial Resource Strain: Low Risk  (04/06/2022)   Overall Financial Resource Strain (CARDIA)    Difficulty of Paying Living Expenses: Not hard at all  Food Insecurity: No Food Insecurity (06/03/2020)   Hunger Vital Sign    Worried About Running Out of Food in the Last Year: Never true    Ran Out of Food in the Last Year: Never true  Transportation Needs: No Transportation Needs (06/03/2020)   PRAPARE - Administrator, Civil Service (Medical): No    Lack of Transportation (Non-Medical): No  Physical Activity: Sufficiently Active (04/06/2022)   Exercise Vital Sign    Days of Exercise per Week: 3 days    Minutes of Exercise per Session: 90 min  Stress: Stress Concern Present (04/06/2022)   [employer] of Occupational Health - Occupational Stress Questionnaire    Feeling of Stress : Very much  Social Connections: Socially Isolated (04/06/2022)   Social Connection and Isolation Panel [NHANES]    Frequency of Communication with Friends and Family: Never    Frequency of Social Gatherings with Friends and Family: More than three  times a week    Attends Religious Services: Never    Database administrator or Organizations: No    Attends Banker Meetings: Never    Marital Status: Never married    Allergies:  Allergies  Allergen Reactions   Pineapple Hives    Current Medications: Current Outpatient Medications  Medication Sig Dispense Refill   ARIPiprazole (ABILIFY) 2 MG tablet Take 1 tablet (2 mg total) by mouth daily. 30 tablet 1   FLUoxetine (PROZAC) 20 MG capsule Take 1 capsule (20 mg total) by mouth daily. 30 capsule 1   gabapentin (NEURONTIN) 300 MG capsule Take 1 capsule (300 mg total) by mouth 3 (three) times daily. 90 capsule 2   nicotine (NICODERM CQ) 21 mg/24hr patch Place 1 patch (21 mg total) onto the skin daily. 28 patch 1   nicotine polacrilex (NICORETTE) 4 MG gum Take 1 each (4 mg total) by mouth as needed for smoking cessation. 100 tablet 0   sildenafil (VIAGRA) 100 MG tablet Take 1 tablet (100 mg total) by mouth daily as needed for erectile dysfunction. 10 tablet 2   No current facility-administered medications for this visit.    ROS: Review of Systems  Objective:  Psychiatric Specialty Exam: There  were no vitals taken for this visit.There is no height or weight on file to calculate BMI.  General Appearance: Fairly Groomed  Eye Contact:  Good  Speech:  Clear and Coherent and Normal Rate  Volume:  Normal  Mood:  Euthymic  Affect:  Appropriate and Congruent  Thought Process:  Coherent, Goal Directed, and Linear  Orientation:  Full (Time, Place, and Person)  Thought Content: Logical   Suicidal Thoughts:  No  Homicidal Thoughts:  No  Memory:  Remote;   Good  Judgment:  Fair  Insight:  Fair  Psychomotor Activity:  Normal  Concentration:  Concentration: Good and Attention Span: Good              Assets:  Communication Skills Desire for Improvement Financial Resources/Insurance Housing Intimacy Leisure Time Physical Health Resilience Social  Support Talents/Skills Transportation Vocational/Educational  ADL's:  Intact  Cognition: WNL  Sleep:  Good   PE: General: well-appearing; no acute distress  Pulm: no increased work of breathing on room air  Strength & Muscle Tone: within normal limits Neuro: no focal neurological deficits observed  Gait & Station: normal  Metabolic Disorder Labs: Lab Results  Component Value Date   HGBA1C 5.8 (H) 12/19/2022   MPG 108 09/28/2021   No results found for: "PROLACTIN" Lab Results  Component Value Date   CHOL 184 12/19/2022   TRIG 89 12/19/2022   HDL 57 12/19/2022   CHOLHDL 3.2 12/19/2022   VLDL 6 09/28/2021   LDLCALC 111 (H) 12/19/2022   LDLCALC 89 09/28/2021   Lab Results  Component Value Date   TSH 0.700 09/28/2021   TSH 0.982 03/23/2021    Therapeutic Level Labs: No results found for: "LITHIUM" No results found for: "VALPROATE" No results found for: "CBMZ"  Screenings: AUDIT    Flowsheet Row Counselor from 06/03/2020 in [facility]  Alcohol Use Disorder Identification Test Final Score (AUDIT) 4      GAD-7    Flowsheet Row Office Visit from 12/19/2022 in [facility] Counselor from 11/23/2022 in [facility] Counselor from 09/07/2022 in [facility] Counselor from 06/08/2022 in [facility] Counselor from 04/26/2022 in [facility]  Total GAD-7 Score 10 13 8 6 10       PHQ2-9    Flowsheet Row Office Visit from 12/19/2022 in [facility] Counselor from 11/23/2022 in [facility] Clinical Support from 10/27/2022 in [facility] Counselor from 09/07/2022 in [facility] Counselor from 06/08/2022 in [facility]  PHQ-2 Total Score 2 4 3 2 2   PHQ-9 Total Score 12 19 12 7 8        Flowsheet Row Counselor from 11/23/2022 in [facility] Counselor from 06/08/2022 in [facility] Office Visit from 04/26/2022 in [facility]  C-SSRS RISK CATEGORY Low Risk Low Risk Low Risk       Collaboration of Care: Collaboration of Care:   Patient/Guardian was advised Release of Information must be obtained prior to any record release in order to collaborate their care with an outside provider. Patient/Guardian was advised if they have not already done so to contact the registration department to sign all necessary forms in order for [place] to release information regarding their care.   Consent: Patient/Guardian gives verbal consent for treatment and assignment of benefits for  services provided during this visit. Patient/Guardian expressed understanding and agreed to proceed.   A total of 35 minutes was spent involved in face to face clinical care, chart review, and documentation.   Park Pope, MD 05/20/2023, 3:27 PM

## 2023-05-24 ENCOUNTER — Ambulatory Visit (HOSPITAL_BASED_OUTPATIENT_CLINIC_OR_DEPARTMENT_OTHER): Payer: MEDICAID | Admitting: Student

## 2023-05-24 ENCOUNTER — Other Ambulatory Visit (HOSPITAL_COMMUNITY): Payer: Self-pay | Admitting: Psychiatry

## 2023-05-24 ENCOUNTER — Other Ambulatory Visit (HOSPITAL_COMMUNITY): Payer: Self-pay

## 2023-05-24 DIAGNOSIS — F411 Generalized anxiety disorder: Secondary | ICD-10-CM | POA: Diagnosis not present

## 2023-05-24 DIAGNOSIS — Z79899 Other long term (current) drug therapy: Secondary | ICD-10-CM

## 2023-05-24 DIAGNOSIS — F331 Major depressive disorder, recurrent, moderate: Secondary | ICD-10-CM | POA: Diagnosis not present

## 2023-05-24 DIAGNOSIS — F17203 Nicotine dependence unspecified, with withdrawal: Secondary | ICD-10-CM

## 2023-05-25 LAB — SPECIMEN STATUS REPORT

## 2023-05-25 LAB — CBC WITH DIFFERENTIAL/PLATELET

## 2023-05-26 ENCOUNTER — Encounter (HOSPITAL_COMMUNITY): Payer: Self-pay | Admitting: Student

## 2023-05-26 ENCOUNTER — Other Ambulatory Visit: Payer: Self-pay

## 2023-05-26 ENCOUNTER — Telehealth (HOSPITAL_COMMUNITY): Payer: Self-pay

## 2023-05-26 IMAGING — DX DG CHEST 2V
2 series · 2 of 2 positions shown · non-contrast
Comparison: None.

CLINICAL DATA: Night sweats, weight loss, depression, anxiety

EXAM:
CHEST - 2 VIEW

[chest pa]
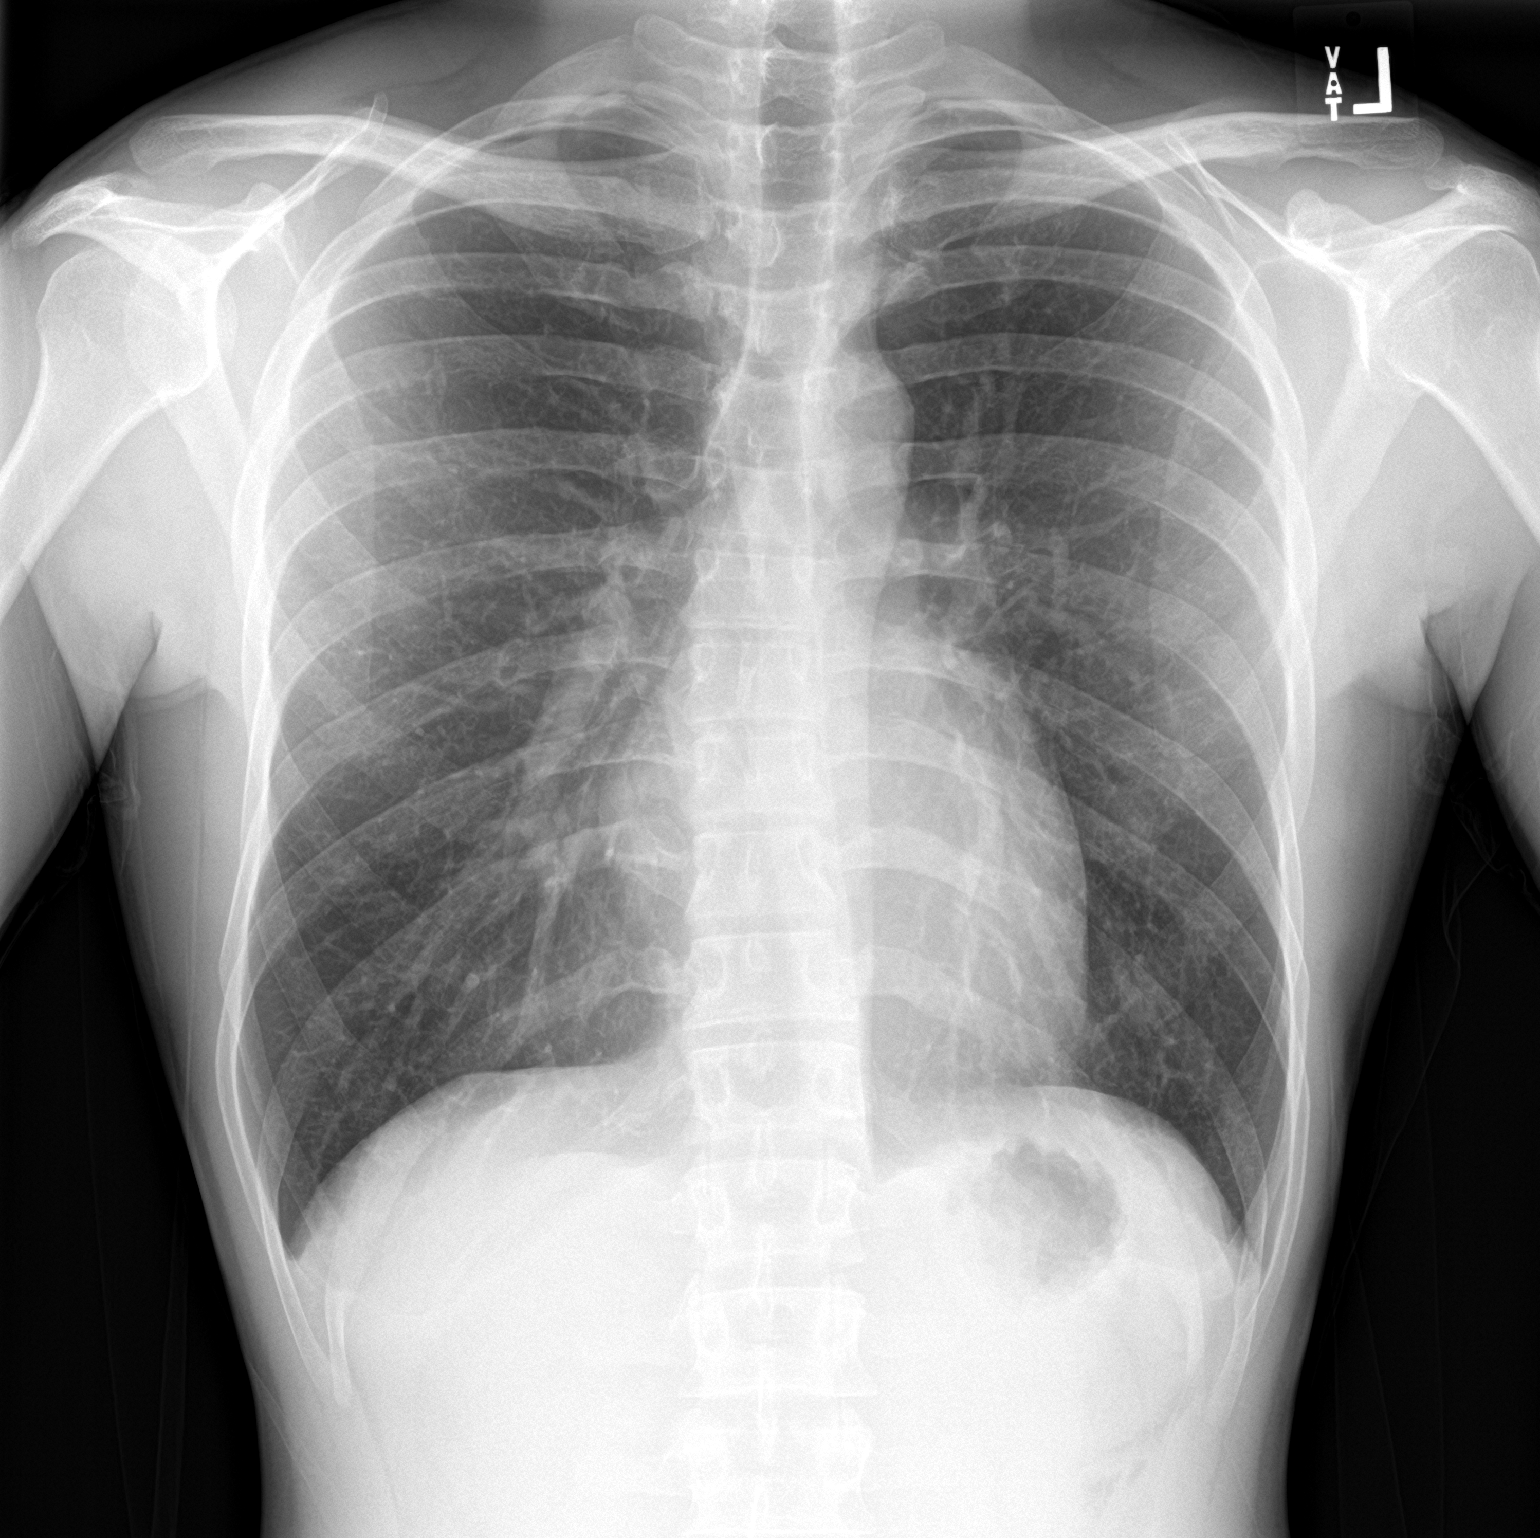

[chest lat]
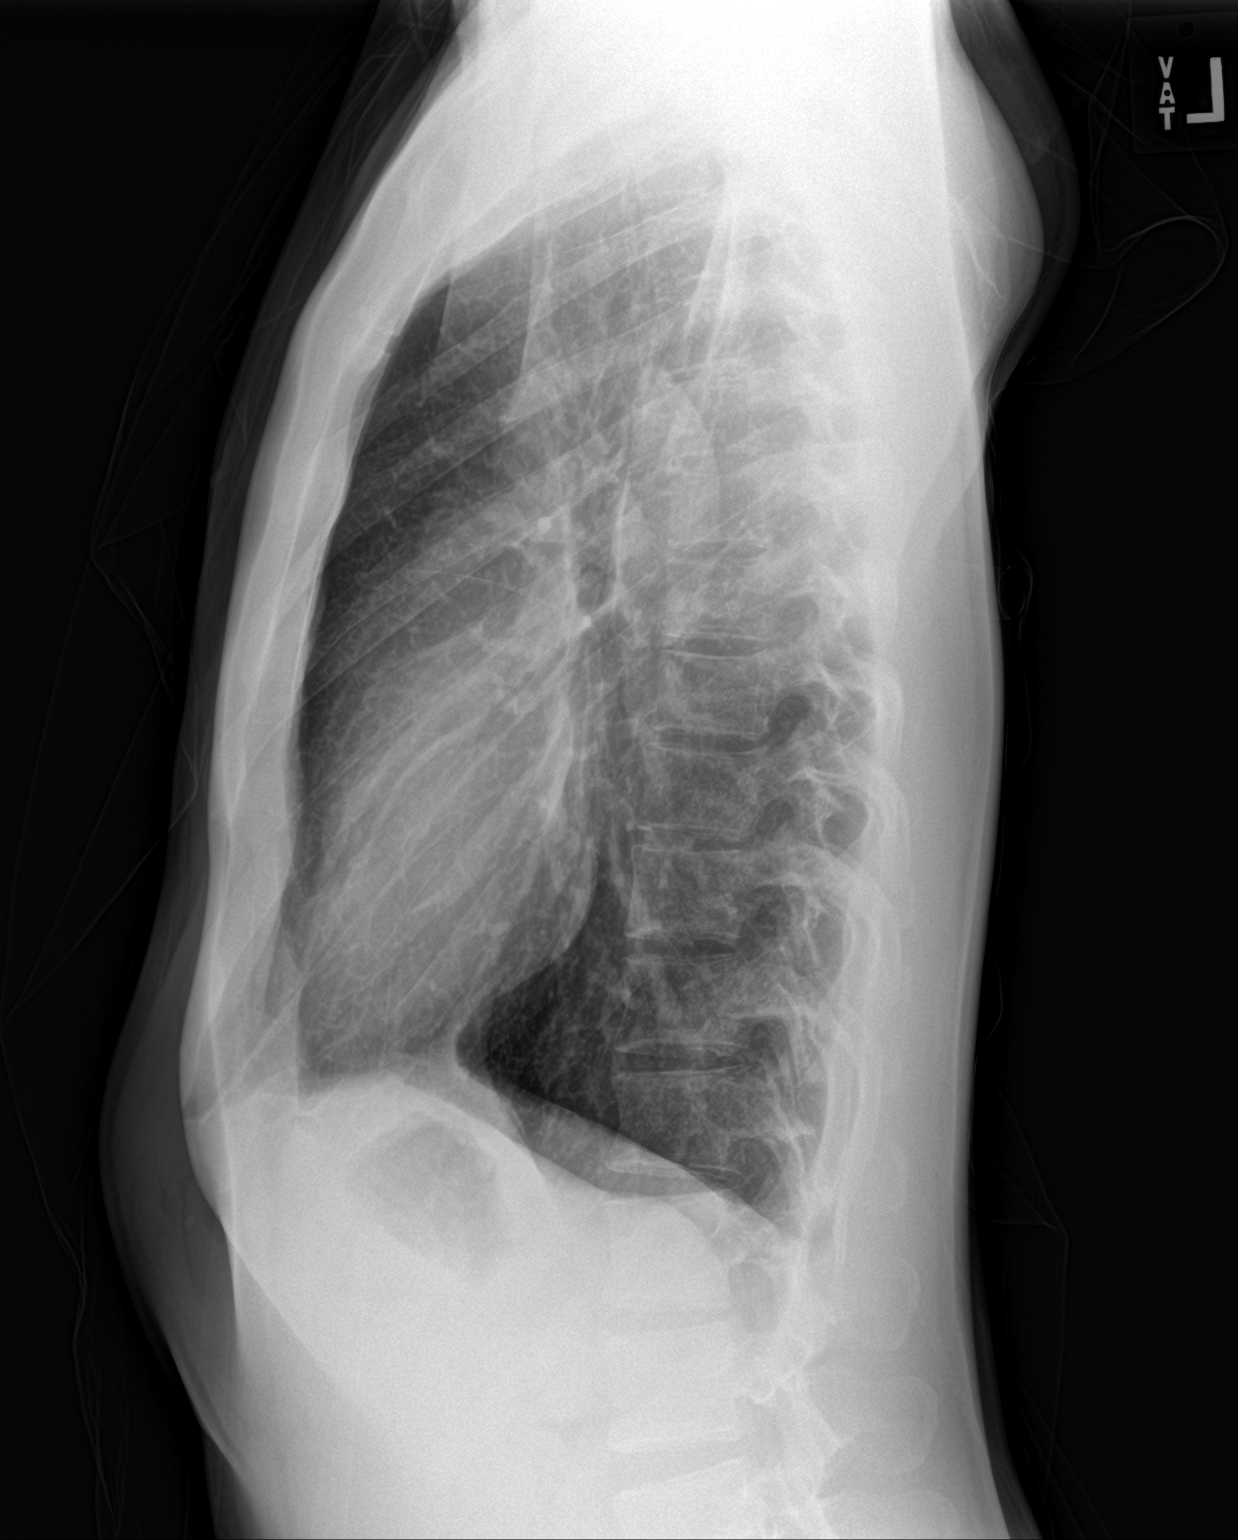

[2 of 2 positions shown; findings below may reference images not displayed]

FINDINGS: Frontal and lateral views of the chest demonstrate an unremarkable
cardiac silhouette. No acute airspace disease, effusion, or
pneumothorax. No acute bony abnormalities.
IMPRESSION: 1. No acute intrathoracic process.

## 2023-05-26 MED ORDER — GABAPENTIN 300 MG PO CAPS
300.0000 mg | ORAL_CAPSULE | Freq: Three times a day (TID) | ORAL | 2 refills | Status: DC
  Filled 2023-05-26: qty 90, 30d supply, fill #0
  Filled 2023-07-02: qty 90, 30d supply, fill #1
  Filled 2023-08-07: qty 90, 30d supply, fill #2

## 2023-05-26 MED ORDER — NICOTINE POLACRILEX 4 MG MT GUM
4.0000 mg | CHEWING_GUM | OROMUCOSAL | 0 refills | Status: DC | PRN
  Filled 2023-05-26: qty 110, 30d supply, fill #0

## 2023-05-26 MED ORDER — FLUOXETINE HCL 20 MG PO CAPS
20.0000 mg | ORAL_CAPSULE | Freq: Every day | ORAL | 1 refills | Status: DC
  Filled 2023-05-26: qty 30, 30d supply, fill #0
  Filled 2023-07-02: qty 30, 30d supply, fill #1

## 2023-05-26 MED ORDER — ARIPIPRAZOLE 5 MG PO TABS
5.0000 mg | ORAL_TABLET | Freq: Every day | ORAL | 1 refills | Status: DC
  Filled 2023-05-26: qty 30, 30d supply, fill #0
  Filled 2023-07-02: qty 30, 30d supply, fill #1

## 2023-05-26 MED ORDER — NICOTINE 21 MG/24HR TD PT24
21.0000 mg | MEDICATED_PATCH | Freq: Every day | TRANSDERMAL | 1 refills | Status: DC
  Filled 2023-05-26: qty 28, 28d supply, fill #0
  Filled 2023-07-02: qty 28, 28d supply, fill #1

## 2023-05-26 NOTE — Telephone Encounter (Signed)
TMS C: Placed outgoing call to pt to conduct TMS phone screening. Patient was provided brief overview of TMS process and mechanisms before assessments were initiated. Pt scored an 18 on PHQ-9; Coordinator asked for verbal consent to send additional assessment (BDI) to pt's email. Pt provided consent and personal email. Coordinator then explained remainder of process and will contact pt again via MyChart messaging to schedule TMS consultation. Pt agreed and had no further questions concluding call.

## 2023-05-26 NOTE — Patient Instructions (Signed)
[email or web address]

## 2023-05-29 ENCOUNTER — Other Ambulatory Visit: Payer: Self-pay

## 2023-05-29 ENCOUNTER — Telehealth (HOSPITAL_COMMUNITY): Payer: Self-pay

## 2023-05-29 NOTE — Telephone Encounter (Signed)
TMS C: Received incoming email over the weekend from patient with their completed BDI assessment, scored a 37 showing severe depression. Coordinator sent outgoing email response on 05/29/23 thanking pt for completing assessment, then requesting pt upload new insurance card into MyChart directly or could sent it via email. Will await new insurance info before scheduling.

## 2023-05-30 ENCOUNTER — Telehealth (HOSPITAL_COMMUNITY): Payer: Self-pay

## 2023-05-30 LAB — COMPREHENSIVE METABOLIC PANEL WITH GFR
ALT: 48 IU/L — ABNORMAL HIGH (ref 0–44)
Alkaline Phosphatase: 99 IU/L (ref 44–121)
BUN: 12 mg/dL (ref 6–20)
Bilirubin Total: 0.3 mg/dL (ref 0.0–1.2)
CO2: 22 mmol/L (ref 20–29)
Calcium: 9.8 mg/dL (ref 8.7–10.2)
Glucose: 88 mg/dL (ref 70–99)
Potassium: 4.1 mmol/L (ref 3.5–5.2)

## 2023-05-30 LAB — IRON,TIBC AND FERRITIN PANEL
Ferritin: 116 ng/mL (ref 30–400)
Iron Saturation: 27 % (ref 15–55)
Iron: 90 ug/dL (ref 38–169)
Total Iron Binding Capacity: 339 ug/dL (ref 250–450)
UIBC: 249 ug/dL (ref 111–343)

## 2023-05-30 LAB — COMPREHENSIVE METABOLIC PANEL
AST: 37 IU/L (ref 0–40)
Albumin: 4.4 g/dL (ref 4.1–5.1)
BUN/Creatinine Ratio: 11 (ref 9–20)
Chloride: 102 mmol/L (ref 96–106)
Creatinine, Ser: 1.09 mg/dL (ref 0.76–1.27)
Globulin, Total: 2.8 g/dL (ref 1.5–4.5)
Sodium: 143 mmol/L (ref 134–144)
Total Protein: 7.2 g/dL (ref 6.0–8.5)
eGFR: 92 mL/min/{1.73_m2} (ref >59)

## 2023-05-30 LAB — VITAMIN B1

## 2023-05-30 LAB — VITAMIN B12: Vitamin B-12: 541 pg/mL (ref 232–1245)

## 2023-05-30 LAB — SPECIMEN STATUS REPORT

## 2023-05-30 LAB — VITAMIN D 25 HYDROXY (VIT D DEFICIENCY, FRACTURES): Vit D, 25-Hydroxy: 17.9 ng/mL — ABNORMAL LOW (ref 30.0–100.0)

## 2023-05-30 LAB — CBC WITH DIFFERENTIAL/PLATELET

## 2023-05-30 LAB — TSH: TSH: 0.658 u[IU]/mL (ref 0.450–4.500)

## 2023-05-30 NOTE — Telephone Encounter (Signed)
TMS C: Placed outgoing call to pt to see if most recent email had been received. Pt confirmed email had been received and reviewed but 'forgot' to upload new insurance card. Pt informed Coordinator that new insurance card would be uploaded by EOD, and they couldn't speak long d/t being at work. Coordinator thanked pt, provided brief time slot for consultation (05/30/23 b/w 3-4pm), and agreed to communicate via email to assist with work schedule. Pt also agreed and concluded call.

## 2023-06-05 ENCOUNTER — Other Ambulatory Visit: Payer: Self-pay

## 2023-06-26 ENCOUNTER — Ambulatory Visit (HOSPITAL_COMMUNITY): Payer: MEDICAID | Admitting: Student

## 2023-07-04 ENCOUNTER — Other Ambulatory Visit: Payer: Self-pay

## 2023-07-06 ENCOUNTER — Ambulatory Visit (HOSPITAL_COMMUNITY): Payer: MEDICAID | Admitting: Clinical

## 2023-07-25 ENCOUNTER — Other Ambulatory Visit (HOSPITAL_COMMUNITY): Payer: Self-pay

## 2023-08-07 ENCOUNTER — Other Ambulatory Visit (HOSPITAL_COMMUNITY): Payer: Self-pay

## 2023-08-07 ENCOUNTER — Other Ambulatory Visit: Payer: Self-pay

## 2023-08-14 ENCOUNTER — Encounter (HOSPITAL_COMMUNITY): Payer: Self-pay | Admitting: Clinical

## 2023-08-14 ENCOUNTER — Ambulatory Visit (HOSPITAL_COMMUNITY): Payer: BC Managed Care – PPO | Admitting: Clinical

## 2023-08-14 DIAGNOSIS — F411 Generalized anxiety disorder: Secondary | ICD-10-CM | POA: Diagnosis not present

## 2023-08-14 DIAGNOSIS — F341 Dysthymic disorder: Secondary | ICD-10-CM | POA: Diagnosis not present

## 2023-08-14 DIAGNOSIS — F1220 Cannabis dependence, uncomplicated: Secondary | ICD-10-CM | POA: Diagnosis not present
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
## 2023-08-16 ENCOUNTER — Other Ambulatory Visit: Payer: Self-pay

## 2023-08-17 ENCOUNTER — Other Ambulatory Visit: Payer: Self-pay

## 2023-08-18 ENCOUNTER — Encounter (HOSPITAL_COMMUNITY): Payer: Self-pay

## 2023-08-20 NOTE — Progress Notes (Unsigned)
BH MD Outpatient Progress Note  08/22/2023 10:38 AM Scott Gordon  MRN:  [id number]  Assessment:  Scott Gordon presents for follow-up evaluation in-person. Patient diagnosis most consistent with persistent depressive disorder, cannabis use disorder, and nicotine use disorder.   Today, 08/22/23, patient reports continuing to struggle with his depression and anxiety symptoms.  He remains interested in TMS but expresses concern that he still has problems with inattention.  Will explore ADHD symptoms next visit; however, his daily cannabis use is likely contributing to his inattention and impeding his ability to focus as well. We will increase his Prozac to 40 mg.   Plan: # Persistent Depressive Disorder Past medication trials:  wellbutrin (migraine, memory loss), fluoxetine (ineffective), duloxetine (overflow incontinence), venlafaxine (ineffective) Status of problem: active Interventions: -- Increase fluoxetine to 40 mg daily --Continue Abilify 5 mg daily --Referral to TMS.     # Generalized Anxiety Disorder Past medication trials: hydroxyzine (ineffective) Status of problem: Worsened Interventions: -- Continue gabapentin 300 mg TID for anxiety symptoms -- Fluoxetine as above   # Nicotine Use Disorder -Continue nicotine patch 21 mg daily  -Continue nicotine gum 4 mg PRN   #Cannabis Use Disorder -Advise cessation   #History of ADHD Unclear if meets criteria versus inattention and hyperactivity secondary to anxiety Past medication trials: Adderall (urinary retention), strattera (urinary retention) Status of problem: Stable Interventions -Continue to monitor symptoms -DIVA next assessment  Patient was given contact information for behavioral health clinic and was instructed to call 911 for emergencies.   Subjective:  Chief Complaint:  No chief complaint on file.   Interval History:  Patient reports stability in his depressive and anxiety symptoms but they  continue to remain.  He continues to also endorse inattention.  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  He denies SI/HI/AVH.  Reports stable appetite and sleep.  He continues to take medications as prescribed.  He inquired about TMS referral and possibility of neuropsychological testing for ADHD.  He continues to report daily cannabis use of unknown quantity but states he smokes approximately for 2 hours/day.  Expresses understanding that this likely is contributing his inattention and his cannabis use is currently not helping with his depression or anxiety. He was amenable to plan and all questions during this visit were addressed.   Visit Diagnosis:    ICD-10-CM   1. GAD (generalized anxiety disorder)  F41.1 gabapentin (NEURONTIN) 300 MG capsule    FLUoxetine (PROZAC) 40 MG capsule    2. MDD (major depressive disorder), recurrent episode, moderate (HCC)  F33.1 ARIPiprazole (ABILIFY) 5 MG tablet       Past Medical History:  Past Medical History:  Diagnosis Date   ADHD, predominantly inattentive type    Anxiety    Depression    Nicotine dependence     Past Surgical History:  Procedure Laterality Date   WISDOM TOOTH EXTRACTION      Family History:  Family History  Problem Relation Age of Onset   Diabetes Mother    Non-Hodgkin's lymphoma Mother    Hypertension Father    Diabetes Father    Bone cancer Paternal Aunt     Social History:  Social History   Socioeconomic History   Marital status: Significant Other    Spouse name: Not on  file   Number of children: Not on file   Years of education: Not on file   Highest education level: Not on file  Occupational History   Not on file  Tobacco Use   Smoking status: Every Day    Current packs/day: 0.25    Average packs/day: 0.3  packs/day for 3.0 years (0.8 ttl pk-yrs)    Types: Cigarettes   Smokeless tobacco: Never   Tobacco comments:    Previously used vapes, switched to cigarettes about 3 years ago due to cost.  Vaping Use   Vaping status: Former   Substances: Nicotine, Flavoring  Substance and Sexual Activity   Alcohol use: Yes    Comment: social alcohol use   Drug use: Yes    Types: Marijuana   Sexual activity: Yes    Birth control/protection: Condom  Other Topics Concern   Not on file  Social History Narrative   Lives with mother and sister. He is polyamorous and has 2 significant others. Currently unemployed, in school for IT. Independent in ADLs and IADLs.    Social Drivers of Corporate investment banker Strain: Low Risk  (04/06/2022)   Overall Financial Resource Strain (CARDIA)    Difficulty of Paying Living Expenses: Not hard at all  Food Insecurity: No Food Insecurity (06/03/2020)   Hunger Vital Sign    Worried About Running Out of Food in the Last Year: Never true    Ran Out of Food in the Last Year: Never true  Transportation Needs: No Transportation Needs (06/03/2020)   PRAPARE - Administrator, Civil Service (Medical): No    Lack of Transportation (Non-Medical): No  Physical Activity: Sufficiently Active (04/06/2022)   Exercise Vital Sign    Days of Exercise per Week: 3 days    Minutes of Exercise per Session: 90 min  Stress: Stress Concern Present (04/06/2022)   [employer] of Occupational Health - Occupational Stress Questionnaire    Feeling of Stress : Very much  Social Connections: Socially Isolated (04/06/2022)   Social Connection and Isolation Panel [NHANES]    Frequency of Communication with Friends and Family: Never    Frequency of Social Gatherings with Friends and Family: More than three times a week    Attends Religious Services: Never    Database administrator or Organizations: No    Attends Banker Meetings: Never    Marital Status: Never married     Allergies:  Allergies  Allergen Reactions   Pineapple Hives    Current Medications: Current Outpatient Medications  Medication Sig Dispense Refill   ARIPiprazole (ABILIFY) 5 MG tablet Take 1 tablet (5 mg total) by mouth daily. 30 tablet 1   FLUoxetine (PROZAC) 40 MG capsule Take 1 capsule (40 mg total) by mouth daily. 30 capsule 1   gabapentin (NEURONTIN) 300 MG capsule Take 1 capsule (300 mg total) by mouth 3 (three) times daily. 90 capsule 1   nicotine (NICODERM CQ) 21 mg/24hr patch Place 1 patch (21 mg total) onto the skin daily. 28 patch 1   nicotine polacrilex (NICORETTE) 4 MG gum Take 1 each (4 mg total) by mouth as needed for smoking cessation. 110 tablet 0   sildenafil (VIAGRA) 100 MG tablet Take 1 tablet (100 mg total) by mouth daily as needed for erectile dysfunction. 10 tablet 2   No current facility-administered medications for this visit.    ROS: Review of Systems  Objective:  Psychiatric Specialty Exam: There were no vitals taken  for this visit.There is no height or weight on file to calculate BMI.  General Appearance: Fairly Groomed  Eye Contact:  Good  Speech:  Clear and Coherent and Normal Rate  Volume:  Normal  Mood:  Euthymic  Affect:  Appropriate and Congruent  Thought Process:  Coherent, Goal Directed, and Linear  Orientation:  Full (Time, Place, and Person)  Thought Content: Logical   Suicidal Thoughts:  No  Homicidal Thoughts:  No  Memory:  Remote;   Good  Judgment:  Fair  Insight:  Fair  Psychomotor Activity:  Normal  Concentration:  Concentration: Good and Attention Span: Good              Assets:  Communication Skills Desire for Improvement Financial Resources/Insurance Housing Intimacy Leisure Time Physical Health Resilience Social Support Talents/Skills Transportation Vocational/Educational  ADL's:  Intact  Cognition: WNL  Sleep:  Good   PE: General: well-appearing; no acute distress  Pulm: no increased work of  breathing on room air  Strength & Muscle Tone: within normal limits Neuro: no focal neurological deficits observed  Gait & Station: normal  Metabolic Disorder Labs: Lab Results  Component Value Date   HGBA1C 5.8 (H) 12/19/2022   MPG 108 09/28/2021   No results found for: "PROLACTIN" Lab Results  Component Value Date   CHOL 184 12/19/2022   TRIG 89 12/19/2022   HDL 57 12/19/2022   CHOLHDL 3.2 12/19/2022   VLDL 6 09/28/2021   LDLCALC 111 (H) 12/19/2022   LDLCALC 89 09/28/2021   Lab Results  Component Value Date   TSH 0.658 05/24/2023   TSH 0.700 09/28/2021    Therapeutic Level Labs: No results found for: "LITHIUM" No results found for: "VALPROATE" No results found for: "CBMZ"  Screenings: AUDIT    Flowsheet Row Counselor from 06/03/2020 in [facility]  Alcohol Use Disorder Identification Test Final Score (AUDIT) 4      GAD-7    Flowsheet Row Counselor from 08/14/2023 in [facility] Visit from 12/19/2022 in [facility] [place] Counselor from 11/23/2022 in [facility] Counselor from 09/07/2022 in [facility] Counselor from 06/08/2022 in [facility]  Total GAD-7 Score 10 10 13 8 6       PHQ2-9    Flowsheet Row Counselor from 08/14/2023 in [facility] at [facility] Visit from 12/19/2022 in [facility] [place] Counselor from 11/23/2022 in [facility] Clinical Support from 10/27/2022 in [facility] Counselor from 09/07/2022 in [facility]  PHQ-2 Total Score 5 2 4 3 2   PHQ-9 Total Score 18 12 19 12 7       Flowsheet Row Counselor from 08/14/2023 in [facility] at  [facility] from 11/23/2022 in [facility] Counselor from 06/08/2022 in [facility]  C-SSRS RISK CATEGORY Low Risk Low Risk Low Risk       Collaboration of Care: Collaboration of Care:   Patient/Guardian was advised Release of Information must be obtained prior to any record release in order to collaborate their care with an outside provider. Patient/Guardian was advised if they have not already done so to contact the registration department to sign all necessary forms in order for [place] to  release information regarding their care.   Consent: Patient/Guardian gives verbal consent for treatment and assignment of benefits for services provided during this visit. Patient/Guardian expressed understanding and agreed to proceed.   A total of 35 minutes was spent involved in face to face clinical care, chart review, and documentation.   Park Pope, MD 08/22/2023, 10:38 AM

## 2023-08-21 ENCOUNTER — Ambulatory Visit (HOSPITAL_BASED_OUTPATIENT_CLINIC_OR_DEPARTMENT_OTHER): Payer: BC Managed Care – PPO | Admitting: Student

## 2023-08-21 DIAGNOSIS — F331 Major depressive disorder, recurrent, moderate: Secondary | ICD-10-CM | POA: Diagnosis not present

## 2023-08-21 DIAGNOSIS — F411 Generalized anxiety disorder: Secondary | ICD-10-CM | POA: Diagnosis not present

## 2023-08-21 MED ORDER — GABAPENTIN 300 MG PO CAPS: 300.0000 mg | ORAL_CAPSULE | Freq: Three times a day (TID) | ORAL | 1 refills | Status: DC

## 2023-08-21 MED ORDER — FLUOXETINE HCL 40 MG PO CAPS: 40.0000 mg | ORAL_CAPSULE | Freq: Every day | ORAL | 1 refills | Status: DC

## 2023-08-21 MED ORDER — ARIPIPRAZOLE 5 MG PO TABS: 5.0000 mg | ORAL_TABLET | Freq: Every day | ORAL | 1 refills | Status: DC

## 2023-08-21 NOTE — Patient Instructions (Addendum)
Please refer to the following for ADHD neuropsychological testing:   [email or web address]

## 2023-08-22 ENCOUNTER — Telehealth (HOSPITAL_COMMUNITY): Payer: Self-pay

## 2023-08-22 NOTE — Addendum Note (Signed)
Addended by: Everlena Cooper on: 08/22/2023 01:34 PM   Modules accepted: Level of Service

## 2023-08-22 NOTE — Telephone Encounter (Signed)
TMS C: Received 2nd referral for pt for TMS tx. Coordinator reviewed chart, collected pertinent info for ppw, and sent outgoing email to pt to schedule phone screening. Coordinator informed pt that screening will need to restart since its been more than 2 months since they last spoke. Coordinator attached PHQ and BDI assessments for pt to complete and send back at their convenience, along with benefit verification through payor [facility]). Coordinator will send reminder call to pt on 08/23/23.

## 2023-08-23 ENCOUNTER — Telehealth (HOSPITAL_COMMUNITY): Payer: Self-pay

## 2023-08-23 NOTE — Telephone Encounter (Signed)
TMS C: Left vm for pt to return call to Coordinator's office line to complete TMS phone screening. Will await pt call.

## 2023-08-28 ENCOUNTER — Telehealth (HOSPITAL_COMMUNITY): Payer: Self-pay

## 2023-08-28 NOTE — Telephone Encounter (Signed)
TMS C: Coordinator requested benefit investigation/verification of pt's insurance plan Engineer, petroleum) through Glass blower/designer (via [employer], [employer]), to save time while still waiting on patient response. According to [facility], [phone], call Ref [phone]), pt's plan is out of network w/ [facility]. Pt will have a 70%-30% coinsurance after the deductible is met. Will await pt response before informing them of update.

## 2023-09-04 ENCOUNTER — Telehealth (HOSPITAL_COMMUNITY): Payer: Self-pay

## 2023-09-04 NOTE — Telephone Encounter (Signed)
TMS C: Coordinator sent an outgoing email to pt (different email address than previous attempt, same address as communicated back in September 2024) to schedule a TMS phone screening. Coordinator requested preferred days/times, benefit verification through payor, and provided attachments of PHQ-9 and BDI assessments. Coordinator provided instructions of how to complete and send back; once received then consultation could be scheduled. Will await pt response.

## 2023-09-20 ENCOUNTER — Other Ambulatory Visit (HOSPITAL_COMMUNITY): Payer: Self-pay | Admitting: Student

## 2023-09-20 DIAGNOSIS — F331 Major depressive disorder, recurrent, moderate: Secondary | ICD-10-CM

## 2023-09-20 MED ORDER — ARIPIPRAZOLE 5 MG PO TABS: 5.0000 mg | ORAL_TABLET | Freq: Every day | ORAL | 0 refills | Status: DC

## 2023-09-20 NOTE — Progress Notes (Signed)
 Received 90 day prescription request for abilify . Sent to preferred pharmacy.

## 2023-10-05 ENCOUNTER — Telehealth (HOSPITAL_COMMUNITY): Payer: Self-pay | Admitting: Student

## 2023-10-05 NOTE — Telephone Encounter (Signed)
Patient stated he will be losing insurance and have to be self pay. Patient asked for Dr. Hazle Quant to call him when he is available.

## 2023-10-07 NOTE — Telephone Encounter (Signed)
Called patient. He wanted to inquire about whether he would be able to be seen again at [facility] since he lost his insurance and if so if he could still DIVA/ADHD testing. I said he could and he stated he may still self-pay and attempt to transfer after next appointment.

## 2023-10-08 NOTE — Progress Notes (Deleted)
 BH MD Outpatient Progress Note  10/08/2023 1:51 PM Scott Gordon  MRN:  [id number]  Assessment:  Scott Gordon presents for follow-up evaluation in-person. Patient diagnosis most consistent with persistent depressive disorder, cannabis use disorder, and nicotine use disorder.   Today, 10/08/23, patient reports continuing to struggle with his depression and anxiety symptoms.  He remains interested in TMS but expresses concern that he still has problems with inattention.  Will explore ADHD symptoms next visit; however, his daily cannabis use is likely contributing to his inattention and impeding his ability to focus as well. We will increase his Prozac to 40 mg.   Plan: # Persistent Depressive Disorder Past medication trials:  wellbutrin (migraine, memory loss), fluoxetine (ineffective), duloxetine (overflow incontinence), venlafaxine (ineffective) Status of problem: active Interventions: -- Increase fluoxetine to 40 mg daily --Continue Abilify 5 mg daily --Referral to TMS.     # Generalized Anxiety Disorder Past medication trials: hydroxyzine (ineffective) Status of problem: Worsened Interventions: -- Continue gabapentin 300 mg TID for anxiety symptoms -- Fluoxetine as above   # Nicotine Use Disorder -Continue nicotine patch 21 mg daily  -Continue nicotine gum 4 mg PRN   #Cannabis Use Disorder -Advise cessation   #History of ADHD Unclear if meets criteria versus inattention and hyperactivity secondary to anxiety Past medication trials: Adderall (urinary retention), strattera (urinary retention) Status of problem: Stable Interventions -Continue to monitor symptoms -DIVA next assessment  Patient was given contact information for behavioral health clinic and was instructed to call 911 for emergencies.   Subjective:  Chief Complaint:  No chief complaint on file.   Interval History:  Patient reports stability in his depressive and anxiety symptoms but they  continue to remain.  He continues to also endorse inattention.  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  He denies SI/HI/AVH.  Reports stable appetite and sleep.  He continues to take medications as prescribed.  He inquired about TMS referral and possibility of neuropsychological testing for ADHD.  He continues to report daily cannabis use of unknown quantity but states he smokes approximately for 2 hours/day.  Expresses understanding that this likely is contributing his inattention and his cannabis use is currently not helping with his depression or anxiety. He was amenable to plan and all questions during this visit were addressed.   Visit Diagnosis:  No diagnosis found.    Past Medical History:  Past Medical History:  Diagnosis Date   ADHD, predominantly inattentive type    Anxiety    Depression    Nicotine dependence     Past Surgical History:  Procedure Laterality Date   WISDOM TOOTH EXTRACTION      Family History:  Family History  Problem Relation Age of Onset   Diabetes Mother    Non-Hodgkin's lymphoma Mother    Hypertension Father    Diabetes Father    Bone cancer Paternal Aunt     Social History:  Social History   Socioeconomic History   Marital status: Significant Other    Spouse name: Not on file   Number of children: Not on file   Years of education: Not on file   Highest education level: Not on file  Occupational History   Not on file  Tobacco Use   Smoking status: Every Day  Current packs/day: 0.25    Average packs/day: 0.3 packs/day for 3.0 years (0.8 ttl pk-yrs)    Types: Cigarettes   Smokeless tobacco: Never   Tobacco comments:    Previously used vapes, switched to cigarettes about 3 years ago due to cost.  Vaping Use   Vaping status: Former    Substances: Nicotine, Flavoring  Substance and Sexual Activity   Alcohol use: Yes    Comment: social alcohol use   Drug use: Yes    Types: Marijuana   Sexual activity: Yes    Birth control/protection: Condom  Other Topics Concern   Not on file  Social History Narrative   Lives with mother and sister. He is polyamorous and has 2 significant others. Currently unemployed, in school for IT. Independent in ADLs and IADLs.    Social Drivers of Corporate investment banker Strain: Low Risk  (04/06/2022)   Overall Financial Resource Strain (CARDIA)    Difficulty of Paying Living Expenses: Not hard at all  Food Insecurity: No Food Insecurity (06/03/2020)   Hunger Vital Sign    Worried About Running Out of Food in the Last Year: Never true    Ran Out of Food in the Last Year: Never true  Transportation Needs: No Transportation Needs (06/03/2020)   PRAPARE - Administrator, Civil Service (Medical): No    Lack of Transportation (Non-Medical): No  Physical Activity: Sufficiently Active (04/06/2022)   Exercise Vital Sign    Days of Exercise per Week: 3 days    Minutes of Exercise per Session: 90 min  Stress: Stress Concern Present (04/06/2022)   [employer] of Occupational Health - Occupational Stress Questionnaire    Feeling of Stress : Very much  Social Connections: Socially Isolated (04/06/2022)   Social Connection and Isolation Panel [NHANES]    Frequency of Communication with Friends and Family: Never    Frequency of Social Gatherings with Friends and Family: More than three times a week    Attends Religious Services: Never    Database administrator or Organizations: No    Attends Banker Meetings: Never    Marital Status: Never married    Allergies:  Allergies  Allergen Reactions   Pineapple Hives    Current Medications: Current Outpatient Medications  Medication Sig Dispense Refill   ARIPiprazole (ABILIFY) 5 MG tablet Take 1 tablet (5 mg total) by mouth  daily. 90 tablet 0   FLUoxetine (PROZAC) 40 MG capsule Take 1 capsule (40 mg total) by mouth daily. 30 capsule 1   gabapentin (NEURONTIN) 300 MG capsule Take 1 capsule (300 mg total) by mouth 3 (three) times daily. 90 capsule 1   nicotine (NICODERM CQ) 21 mg/24hr patch Place 1 patch (21 mg total) onto the skin daily. 28 patch 1   nicotine polacrilex (NICORETTE) 4 MG gum Take 1 each (4 mg total) by mouth as needed for smoking cessation. 110 tablet 0   sildenafil (VIAGRA) 100 MG tablet Take 1 tablet (100 mg total) by mouth daily as needed for erectile dysfunction. 10 tablet 2   No current facility-administered medications for this visit.    ROS: Review of Systems  Objective:  Psychiatric Specialty Exam: There were no vitals taken for this visit.There is no height or weight on file to calculate BMI.  General Appearance: Fairly Groomed  Eye Contact:  Good  Speech:  Clear and Coherent and Normal Rate  Volume:  Normal  Mood:  Euthymic  Affect:  Appropriate and  Congruent  Thought Process:  Coherent, Goal Directed, and Linear  Orientation:  Full (Time, Place, and Person)  Thought Content: Logical   Suicidal Thoughts:  No  Homicidal Thoughts:  No  Memory:  Remote;   Good  Judgment:  Fair  Insight:  Fair  Psychomotor Activity:  Normal  Concentration:  Concentration: Good and Attention Span: Good              Assets:  Communication Skills Desire for Improvement Financial Resources/Insurance Housing Intimacy Leisure Time Physical Health Resilience Social Support Talents/Skills Transportation Vocational/Educational  ADL's:  Intact  Cognition: WNL  Sleep:  Good   PE: General: well-appearing; no acute distress  Pulm: no increased work of breathing on room air  Strength & Muscle Tone: within normal limits Neuro: no focal neurological deficits observed  Gait & Station: normal  Metabolic Disorder Labs: Lab Results  Component Value Date   HGBA1C 5.8 (H) 12/19/2022   MPG  108 09/28/2021   No results found for: "PROLACTIN" Lab Results  Component Value Date   CHOL 184 12/19/2022   TRIG 89 12/19/2022   HDL 57 12/19/2022   CHOLHDL 3.2 12/19/2022   VLDL 6 09/28/2021   LDLCALC 111 (H) 12/19/2022   LDLCALC 89 09/28/2021   Lab Results  Component Value Date   TSH 0.658 05/24/2023   TSH 0.700 09/28/2021    Therapeutic Level Labs: No results found for: "LITHIUM" No results found for: "VALPROATE" No results found for: "CBMZ"  Screenings: AUDIT    Flowsheet Row Counselor from 06/03/2020 in [facility]  Alcohol Use Disorder Identification Test Final Score (AUDIT) 4      GAD-7    Flowsheet Row Counselor from 08/14/2023 in [facility] at [facility] Visit from 12/19/2022 in [facility] [place]. [facility] Counselor from 11/23/2022 in [facility] Counselor from 09/07/2022 in [facility] Counselor from 06/08/2022 in [facility]  Total GAD-7 Score 10 10 13 8 6       PHQ2-9    Flowsheet Row Counselor from 08/14/2023 in [facility] at [facility] Visit from 12/19/2022 in [facility] - [facility] Of [place]. [facility] Counselor from 11/23/2022 in [facility] Clinical Support from 10/27/2022 in [facility] Counselor from 09/07/2022 in [facility]  PHQ-2 Total Score 5 2 4 3 2   PHQ-9 Total Score 18 12 19 12 7       Flowsheet Row Counselor from 08/14/2023 in [facility] at [facility] from 11/23/2022 in [facility] Counselor from 06/08/2022 in [facility]  C-SSRS RISK CATEGORY Low Risk Low Risk Low Risk       Collaboration of Care: Collaboration  of Care:   Patient/Guardian was advised Release of Information must be obtained prior to any record release in order to collaborate their care with an outside provider. Patient/Guardian was advised if they have not already done so to contact the registration department to sign all necessary forms in order for [place] to release information regarding their care.   Consent: Patient/Guardian gives verbal consent for treatment and assignment of benefits for services provided during this visit. Patient/Guardian expressed understanding and agreed to proceed.   A total of 35 minutes was spent involved in face to face  clinical care, chart review, and documentation.   Park Pope, MD 10/08/2023, 1:51 PM

## 2023-10-09 ENCOUNTER — Ambulatory Visit (HOSPITAL_COMMUNITY): Payer: BC Managed Care – PPO | Admitting: Clinical

## 2023-10-09 ENCOUNTER — Ambulatory Visit (HOSPITAL_COMMUNITY): Payer: BC Managed Care – PPO | Admitting: Student

## 2023-10-23 ENCOUNTER — Ambulatory Visit (INDEPENDENT_AMBULATORY_CARE_PROVIDER_SITE_OTHER): Payer: Self-pay | Admitting: Clinical

## 2023-10-23 ENCOUNTER — Encounter (HOSPITAL_COMMUNITY): Payer: Self-pay | Admitting: Clinical

## 2023-10-23 DIAGNOSIS — Z91199 Patient's noncompliance with other medical treatment and regimen due to unspecified reason: Secondary | ICD-10-CM

## 2023-10-23 NOTE — Progress Notes (Signed)
Therapy Progress Note  Patient had an appointment scheduled with therapist on 10/23/2023  at 1:00pm.  This was to be his first appointment following his CCA on 08/14/23.  The last appointment was cancelled, apparently due to "no insurance pre-authorization."   He did not arrive for the session by 1:20pm, so was considered a no show.  As per [facility] policy, he will be be charged for this no-show appointment.  This is his first no-show.  He has an additional 6 appointments scheduled.  Encounter Diagnosis  Name Primary?   No-show for appointment Yes     Scott Mantle, LCSW 10/23/2023, 3:22 PM

## 2023-11-05 ENCOUNTER — Encounter (HOSPITAL_COMMUNITY): Payer: Self-pay | Admitting: Psychiatry

## 2023-11-05 ENCOUNTER — Inpatient Hospital Stay (HOSPITAL_COMMUNITY)
Admission: AD | Admit: 2023-11-05 | Discharge: 2023-11-09 | DRG: 885 | Disposition: A | Discharge status: Home / Self Care | Payer: MEDICAID | Source: Intra-hospital | Attending: Psychiatry | Admitting: Psychiatry

## 2023-11-05 ENCOUNTER — Ambulatory Visit (HOSPITAL_COMMUNITY)
Admission: EM | Admit: 2023-11-05 | Discharge: 2023-11-05 | Disposition: A | Discharge status: Other Acute Inpatient Hospital | Payer: MEDICAID | Attending: Psychiatry | Admitting: Psychiatry

## 2023-11-05 ENCOUNTER — Other Ambulatory Visit: Payer: Self-pay

## 2023-11-05 DIAGNOSIS — R45851 Suicidal ideations: Secondary | ICD-10-CM | POA: Insufficient documentation

## 2023-11-05 DIAGNOSIS — Z8249 Family history of ischemic heart disease and other diseases of the circulatory system: Secondary | ICD-10-CM

## 2023-11-05 DIAGNOSIS — F329 Major depressive disorder, single episode, unspecified: Principal | ICD-10-CM | POA: Diagnosis present

## 2023-11-05 DIAGNOSIS — F332 Major depressive disorder, recurrent severe without psychotic features: Principal | ICD-10-CM | POA: Diagnosis present

## 2023-11-05 DIAGNOSIS — Z560 Unemployment, unspecified: Secondary | ICD-10-CM | POA: Diagnosis not present

## 2023-11-05 DIAGNOSIS — F17210 Nicotine dependence, cigarettes, uncomplicated: Secondary | ICD-10-CM | POA: Diagnosis present

## 2023-11-05 DIAGNOSIS — Z833 Family history of diabetes mellitus: Secondary | ICD-10-CM | POA: Diagnosis not present

## 2023-11-05 DIAGNOSIS — Z5971 Insufficient health insurance coverage: Secondary | ICD-10-CM | POA: Insufficient documentation

## 2023-11-05 DIAGNOSIS — F411 Generalized anxiety disorder: Secondary | ICD-10-CM | POA: Diagnosis present

## 2023-11-05 DIAGNOSIS — F17203 Nicotine dependence unspecified, with withdrawal: Secondary | ICD-10-CM

## 2023-11-05 DIAGNOSIS — Z818 Family history of other mental and behavioral disorders: Secondary | ICD-10-CM | POA: Diagnosis not present

## 2023-11-05 DIAGNOSIS — Z62810 Personal history of physical and sexual abuse in childhood: Secondary | ICD-10-CM | POA: Diagnosis not present

## 2023-11-05 DIAGNOSIS — Z5986 Financial insecurity: Secondary | ICD-10-CM | POA: Diagnosis not present

## 2023-11-05 DIAGNOSIS — Z79899 Other long term (current) drug therapy: Secondary | ICD-10-CM | POA: Insufficient documentation

## 2023-11-05 DIAGNOSIS — Z62811 Personal history of psychological abuse in childhood: Secondary | ICD-10-CM | POA: Diagnosis not present

## 2023-11-05 DIAGNOSIS — Z91141 Patient's other noncompliance with medication regimen due to financial hardship: Secondary | ICD-10-CM | POA: Insufficient documentation

## 2023-11-05 DIAGNOSIS — Z91120 Patient's intentional underdosing of medication regimen due to financial hardship: Secondary | ICD-10-CM | POA: Diagnosis not present

## 2023-11-05 DIAGNOSIS — T43226A Underdosing of selective serotonin reuptake inhibitors, initial encounter: Secondary | ICD-10-CM | POA: Diagnosis present

## 2023-11-05 DIAGNOSIS — F341 Dysthymic disorder: Secondary | ICD-10-CM | POA: Insufficient documentation

## 2023-11-05 DIAGNOSIS — F331 Major depressive disorder, recurrent, moderate: Secondary | ICD-10-CM | POA: Insufficient documentation

## 2023-11-05 LAB — CBC WITH DIFFERENTIAL/PLATELET
Abs Immature Granulocytes: 0.01 10*3/uL (ref 0.00–0.07)
Basophils Absolute: 0 10*3/uL (ref 0.0–0.1)
Basophils Relative: 0 %
Eosinophils Absolute: 0.1 10*3/uL (ref 0.0–0.5)
Eosinophils Relative: 1 %
HCT: 45.8 % (ref 39.0–52.0)
Hemoglobin: 14.9 g/dL (ref 13.0–17.0)
Immature Granulocytes: 0 %
Lymphocytes Relative: 21 %
Lymphs Abs: 1.4 10*3/uL (ref 0.7–4.0)
MCH: 28.2 pg (ref 26.0–34.0)
MCHC: 32.5 g/dL (ref 30.0–36.0)
MCV: 86.6 fL (ref 80.0–100.0)
Monocytes Absolute: 0.7 10*3/uL (ref 0.1–1.0)
Monocytes Relative: 10 %
Neutro Abs: 4.7 10*3/uL (ref 1.7–7.7)
Neutrophils Relative %: 68 %
Platelets: 225 10*3/uL (ref 150–400)
RBC: 5.29 MIL/uL (ref 4.22–5.81)
RDW: 13.6 % (ref 11.5–15.5)
WBC: 6.8 10*3/uL (ref 4.0–10.5)
nRBC: 0 % (ref 0.0–0.2)

## 2023-11-05 LAB — COMPREHENSIVE METABOLIC PANEL
ALT: 29 U/L (ref 0–44)
AST: 31 U/L (ref 15–41)
Albumin: 3.6 g/dL (ref 3.5–5.0)
Alkaline Phosphatase: 58 U/L (ref 38–126)
Anion gap: 9 (ref 5–15)
BUN: 10 mg/dL (ref 6–20)
CO2: 28 mmol/L (ref 22–32)
Calcium: 9.4 mg/dL (ref 8.9–10.3)
Chloride: 106 mmol/L (ref 98–111)
Creatinine, Ser: 0.92 mg/dL (ref 0.61–1.24)
GFR, Estimated: >60 mL/min (ref >60)
Glucose, Bld: 91 mg/dL (ref 70–99)
Potassium: 3.8 mmol/L (ref 3.5–5.1)
Sodium: 143 mmol/L (ref 135–145)
Total Bilirubin: 0.7 mg/dL (ref 0.0–1.2)
Total Protein: 6.6 g/dL (ref 6.5–8.1)

## 2023-11-05 LAB — POCT URINE DRUG SCREEN - MANUAL ENTRY (I-SCREEN)
POC Amphetamine UR: NOT DETECTED
POC Buprenorphine (BUP): NOT DETECTED
POC Cocaine UR: NOT DETECTED
POC Marijuana UR: POSITIVE — AB
POC Methadone UR: NOT DETECTED
POC Methamphetamine UR: NOT DETECTED
POC Morphine: NOT DETECTED
POC Oxazepam (BZO): NOT DETECTED
POC Oxycodone UR: NOT DETECTED
POC Secobarbital (BAR): NOT DETECTED

## 2023-11-05 LAB — LIPID PANEL
Cholesterol: 181 mg/dL (ref 0–200)
HDL: 63 mg/dL (ref >40)
LDL Cholesterol: 104 mg/dL — ABNORMAL HIGH (ref 0–99)
Total CHOL/HDL Ratio: 2.9 ratio
Triglycerides: 69 mg/dL (ref <150)
VLDL: 14 mg/dL (ref 0–40)

## 2023-11-05 LAB — ETHANOL: Alcohol, Ethyl (B): <10 mg/dL (ref <10)

## 2023-11-05 LAB — TSH: TSH: 0.473 u[IU]/mL (ref 0.350–4.500)

## 2023-11-05 LAB — HEMOGLOBIN A1C
Hgb A1c MFr Bld: 5.2 % (ref 4.8–5.6)
Mean Plasma Glucose: 102.54 mg/dL

## 2023-11-05 MED ORDER — LORAZEPAM 2 MG/ML IJ SOLN: 2.0000 mg | Freq: Three times a day (TID) | INTRAMUSCULAR | Status: DC | PRN

## 2023-11-05 MED ORDER — MAGNESIUM HYDROXIDE 400 MG/5ML PO SUSP: 30.0000 mL | Freq: Every day | ORAL | Status: DC | PRN

## 2023-11-05 MED ORDER — DIPHENHYDRAMINE HCL 50 MG/ML IJ SOLN: 50.0000 mg | Freq: Three times a day (TID) | INTRAMUSCULAR | Status: DC | PRN

## 2023-11-05 MED ORDER — GABAPENTIN 300 MG PO CAPS
300.0000 mg | ORAL_CAPSULE | Freq: Three times a day (TID) | ORAL | Status: DC
  Administered 2023-11-05: 300 mg via ORAL
  Filled 2023-11-05: qty 1

## 2023-11-05 MED ORDER — ACETAMINOPHEN 325 MG PO TABS: 650.0000 mg | ORAL_TABLET | Freq: Four times a day (QID) | ORAL | Status: DC | PRN

## 2023-11-05 MED ORDER — ARIPIPRAZOLE 5 MG PO TABS
5.0000 mg | ORAL_TABLET | Freq: Every day | ORAL | Status: DC
  Administered 2023-11-05: 5 mg via ORAL
  Filled 2023-11-05: qty 1

## 2023-11-05 MED ORDER — ALUM & MAG HYDROXIDE-SIMETH 200-200-20 MG/5ML PO SUSP: 30.0000 mL | ORAL | Status: DC | PRN

## 2023-11-05 MED ORDER — DIPHENHYDRAMINE HCL 25 MG PO CAPS: 50.0000 mg | ORAL_CAPSULE | Freq: Three times a day (TID) | ORAL | Status: DC | PRN

## 2023-11-05 MED ORDER — DIPHENHYDRAMINE HCL 50 MG PO CAPS: 50.0000 mg | ORAL_CAPSULE | Freq: Three times a day (TID) | ORAL | Status: DC | PRN

## 2023-11-05 MED ORDER — GABAPENTIN 300 MG PO CAPS
300.0000 mg | ORAL_CAPSULE | Freq: Three times a day (TID) | ORAL | Status: DC
  Administered 2023-11-05 – 2023-11-09 (×11): 300 mg via ORAL
  Filled 2023-11-05 (×16): qty 1

## 2023-11-05 MED ORDER — INFLUENZA VIRUS VACC SPLIT PF (FLUZONE) 0.5 ML IM SUSY
0.5000 mL | PREFILLED_SYRINGE | INTRAMUSCULAR | Status: DC
  Filled 2023-11-05: qty 0.5

## 2023-11-05 MED ORDER — FLUOXETINE HCL 20 MG PO CAPS
20.0000 mg | ORAL_CAPSULE | Freq: Every day | ORAL | Status: DC
  Administered 2023-11-06: 20 mg via ORAL
  Filled 2023-11-05 (×2): qty 1

## 2023-11-05 MED ORDER — FLUOXETINE HCL 20 MG PO CAPS
20.0000 mg | ORAL_CAPSULE | Freq: Every day | ORAL | Status: DC
  Administered 2023-11-05: 20 mg via ORAL
  Filled 2023-11-05: qty 1

## 2023-11-05 MED ORDER — PNEUMOCOCCAL 20-VAL CONJ VACC 0.5 ML IM SUSY
0.5000 mL | PREFILLED_SYRINGE | INTRAMUSCULAR | Status: DC
  Filled 2023-11-05: qty 0.5

## 2023-11-05 MED ORDER — TRAZODONE HCL 50 MG PO TABS: 50.0000 mg | ORAL_TABLET | Freq: Every evening | ORAL | Status: DC | PRN

## 2023-11-05 MED ORDER — HALOPERIDOL LACTATE 5 MG/ML IJ SOLN: 10.0000 mg | Freq: Three times a day (TID) | INTRAMUSCULAR | Status: DC | PRN

## 2023-11-05 MED ORDER — HALOPERIDOL 5 MG PO TABS: 5.0000 mg | ORAL_TABLET | Freq: Three times a day (TID) | ORAL | Status: DC | PRN

## 2023-11-05 MED ORDER — ARIPIPRAZOLE 5 MG PO TABS
5.0000 mg | ORAL_TABLET | Freq: Every day | ORAL | Status: DC
  Administered 2023-11-06 – 2023-11-09 (×4): 5 mg via ORAL
  Filled 2023-11-05 (×6): qty 1

## 2023-11-05 MED ORDER — NICOTINE 21 MG/24HR TD PT24
21.0000 mg | MEDICATED_PATCH | Freq: Every day | TRANSDERMAL | Status: DC
  Administered 2023-11-05: 21 mg via TRANSDERMAL
  Filled 2023-11-05: qty 1

## 2023-11-05 MED ORDER — HALOPERIDOL LACTATE 5 MG/ML IJ SOLN: 5.0000 mg | Freq: Three times a day (TID) | INTRAMUSCULAR | Status: DC | PRN

## 2023-11-05 NOTE — Progress Notes (Signed)
 Pt is a 27 y Male transferred by safe transport from [facility] to dr. Carron Curie services with diagnosis of MDD.  Pt arrived awake and alert x 4.   Flat affect and depressed mood.  Pt was searched and skin assessment performed.  Pt's belongings were placed in locker 14.  Pt was cooperative and responded appropriately throughout admission.   He was escorted onto the unit and q checks started.   Pt was oriented to milieu and room.   No distress noted.

## 2023-11-05 NOTE — ED Notes (Signed)
 Patient admitted to obs unit. Patient presents depressed and sad. Patient endorses passive SI but denies HI and A/V/H with no plan or intent. Patient states his financial situation at his current job (total wine) and not being able to move out of his mother's house are main triggers. Patient cooperative during admission and compliant with medication. Patient oriented to unit and is currently sitting calmly reading his book. Patient is aware of pending admission to [facility] later this evening. Patient denies any pain or discomfort at this time. No s/s of current distress.

## 2023-11-05 NOTE — Tx Team (Signed)
 Initial Treatment Plan 11/05/2023 6:42 PM [name] ZOX:[id number]    PATIENT STRESSORS: Financial difficulties   Occupational concerns     PATIENT STRENGTHS: Ability for insight  Capable of independent living  Communication skills  Motivation for treatment/growth  Supportive family/friends    PATIENT IDENTIFIED PROBLEMS: Financial problems   Occupational Concerns                    DISCHARGE CRITERIA:  Ability to meet basic life and health needs Improved stabilization in mood, thinking, and/or behavior Need for constant or close observation no longer present Safe-care adequate arrangements made  PRELIMINARY DISCHARGE PLAN: Return to previous living arrangement Return to previous work or school arrangements  PATIENT/FAMILY INVOLVEMENT: This treatment plan has been presented to and reviewed with the patient, Scott Gordon, and/or family member,   The patient and family have been given the opportunity to ask questions and make suggestions.  Darnelle Spangle Niel Hummer, RN 11/05/2023, 6:42 PM

## 2023-11-05 NOTE — ED Provider Notes (Signed)
 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    Patient seen face to face by this provider, consulted with Dr. Particia Jasper; and chart reviewed on 11/05/23.   During evaluation HARITH MCCADDEN is sitting upright with no noted distress.  He is alert/oriented x 4, calm, cooperative, and attentive.  His responses were appropriated to assessment questions.  His mood is depressed and anxious with congruent affect. He endorses feelings of hopelessness, worthlessness, feelings of "it would be better if I wasn't here," hypersomnia (sleeping greater than 10 hours a night).  He spoke in a clear tone at moderate volume, and normal pace, with good eye contact. He endorses suicidal thoughts with no active plan. He denies /self-harm/homicidal ideation, psychosis, and paranoia.  Objectively:  there is no evidence of psychosis/mania or delusional thinking. He conversed coherently, with goal directed thoughts, and no distractibility, or pre-occupation and he has denied self-harm/homicidal ideation, psychosis, and paranoia. He cannot contract for safety if he is discharged home.  Alexandar currently sees Dr. Hazle Quant upstairs in the outpatient clinic of [facility]. He reports he lost his insurance and he has not been able to afford his Abilify or Prozac in over a month. The only medication he has continued to take was his Gabapentin 300mg  TID. He reports he was seeing  a therapist upstairs as well but he missed an appointment and he believes he cannot go back to that therapist without being assessed again.   Alexandar will be admitted to the continous observation unit with recommendations for inpatient psychiatric admission. Per Malva Limes, AC at [facility], Alexandar can come to [facility] today after his initial workup is completed. He verbally agrees to this plan.  Total Time spent with patient: 30 minutes  Musculoskeletal  Strength & Muscle Tone: within normal limits Gait & Station: normal Patient leans: N/A  Psychiatric Specialty Exam  Presentation General Appearance:  Appropriate for Environment; Casual  Eye Contact: Fair  Speech: Clear and Coherent  Speech Volume: Normal  Handedness: Right   Mood and Affect  Mood: Anxious; Depressed  Affect: Appropriate; Congruent   Thought Process  Thought Processes: Coherent  Descriptions of Associations:Intact  Orientation:Full (Time, Place and Person)  Thought Content:Logical  Diagnosis of Schizophrenia or Schizoaffective disorder in past: No   Hallucinations:Hallucinations: None  Ideas of Reference:None  Suicidal Thoughts:Suicidal Thoughts: Yes, Active SI Active Intent and/or Plan: Without Intent; Without Plan; Without Means to Carry Out  Homicidal Thoughts:Homicidal Thoughts: No   Sensorium  Memory: Immediate Good; Recent Good; Remote Good  Judgment: Poor  Insight: Poor   Executive Functions  Concentration: Fair  Attention Span: Fair  Recall: Good  Fund of Knowledge: Good  Language: Good   Psychomotor Activity  Psychomotor Activity: Psychomotor Activity: Normal   Assets  Assets: Communication Skills; Desire for Improvement; Housing; Physical Health; Transportation   Sleep  Sleep: Sleep: Good Number of Hours of Sleep: 10   Nutritional Assessment (For OBS and FBC admissions only) Has the patient had a weight loss or gain of 10 pounds or more  in  the last 3 months?: No Has the patient had a decrease in food intake/or appetite?: No Does the patient have dental problems?: No Does the patient have eating habits or behaviors that may be indicators of an eating disorder including binging or inducing vomiting?: No Has the patient recently lost weight without trying?: 0 Has the patient been eating poorly because of a decreased appetite?: 0 Malnutrition Screening Tool Score: 0    Physical Exam Vitals reviewed.  Constitutional:      Appearance: Normal appearance.  HENT:     Head: Normocephalic.     Nose: Nose normal.  Cardiovascular:     Rate and Rhythm: Normal rate.     Pulses: Normal pulses.  Pulmonary:     Effort: Pulmonary effort is normal.  Musculoskeletal:        General: Normal range of motion.     Cervical back: Normal range of motion.  Neurological:     General: No focal deficit present.     Mental Status: He is alert and oriented to person, place, and time.  Psychiatric:        Attention and Perception: Attention and perception normal.        Mood and Affect: Mood is anxious and depressed. Affect is flat.        Speech: Speech normal.        Behavior: Behavior normal. Behavior is cooperative.        Thought Content: Thought content includes suicidal ideation.        Cognition and Memory: Cognition normal.        Judgment: Judgment normal.    Review of Systems  Constitutional: Negative.   HENT: Negative.    Eyes: Negative.   Respiratory: Negative.    Cardiovascular: Negative.   Gastrointestinal: Negative.   Genitourinary: Negative.   Musculoskeletal: Negative.   Skin: Negative.   Psychiatric/Behavioral:  Positive for depression, substance abuse and suicidal ideas. The patient is nervous/anxious.     Blood pressure 111/74, pulse 76, temperature 98.4 F (36.9 C), temperature source Oral, resp. rate 16, SpO2 100%. There is no height or weight on file to calculate BMI.  Past Psychiatric History: MDD, ADHD,  GAD, cannibis abuse, nicotine dependence  Previous psychiatric hospitalizations: He reports he was admitted to [facility] about a year ago but denies any other psychiatric hospitalizations.  Previous Suicide Attempts: denies History of Violence: Denies Outpatient psychiatrist: Dr. Hazle Quant at [facility]   Is the patient at risk to self? Yes  Has the patient been a risk to self in the past 6 months? Yes .    Has the patient been a risk to self within the distant past? Yes   Is the patient a risk to others? No   Has the patient been a risk to others in the past 6 months? No   Has the patient been a risk to others within the distant past? No   Past Medical History: Alexandar denies any past medical history.   Family History: Alexandar reports his paternal uncle committed suicide. Denies any other mental health history in his family.   Social History: Alexandar currently lives with his mother. He is not married and denies having any children. He currently works at [employer]. Denies any history of sexual or physical abuse. He reports he graduated high school and has some college education.   Last Labs:  Orders Only on 05/24/2023  Component Date Value Ref Range Status   Total Iron  Binding Capacity 05/24/2023 339  250 - 450 ug/dL Final   UIBC 86/57/8469 249  111 - 343 ug/dL Final   Iron 62/95/2841 90  38 - 169 ug/dL Final   Iron Saturation 05/24/2023 27  15 - 55 % Final   Ferritin 05/24/2023 116  30 - 400 ng/mL Final   WBC 05/24/2023 CANCELED  x10E3/uL Final-Edited   Comment: Test not performed. Specimen is hemolyzed. Unable to obtain valid results.  Result canceled by the ancillary.    Glucose 05/24/2023 88  70 - 99 mg/dL Final   BUN 32/44/0102 12  6 - 20 mg/dL Final   Creatinine, Ser 05/24/2023 1.09  0.76 - 1.27 mg/dL Final   eGFR 72/53/6644 92  >59 mL/min/1.73 Final   BUN/Creatinine Ratio 05/24/2023 11  9 - 20 Final   Sodium 05/24/2023 143  134 - 144 mmol/L Final    Potassium 05/24/2023 4.1  3.5 - 5.2 mmol/L Final   Chloride 05/24/2023 102  96 - 106 mmol/L Final   CO2 05/24/2023 22  20 - 29 mmol/L Final   Calcium 05/24/2023 9.8  8.7 - 10.2 mg/dL Final   Total Protein 03/47/4259 7.2  6.0 - 8.5 g/dL Final   Albumin 56/38/7564 4.4  4.1 - 5.1 g/dL Final   Globulin, Total 05/24/2023 2.8  1.5 - 4.5 g/dL Final   Bilirubin Total 05/24/2023 0.3  0.0 - 1.2 mg/dL Final   Alkaline Phosphatase 05/24/2023 99  44 - 121 IU/L Final   AST 05/24/2023 37  0 - 40 IU/L Final   ALT 05/24/2023 48 (H)  0 - 44 IU/L Final   TSH 05/24/2023 0.658  0.450 - 4.500 uIU/mL Final   Vit D, 25-Hydroxy 05/24/2023 17.9 (L)  30.0 - 100.0 ng/mL Final   Comment: Vitamin D deficiency has been defined by the Institute of Medicine and an Endocrine Society practice guideline as a level of serum 25-OH vitamin D less than 20 ng/mL (1,2). The Endocrine Society went on to further define vitamin D insufficiency as a level between 21 and 29 ng/mL (2). 1. IOM (Institute of Medicine). 2010. Dietary reference    intakes for calcium and D. [place]: The    [employer]. 2. Holick MF, Binkley Pocahontas, Bischoff-Ferrari HA, et al.    Evaluation, treatment, and prevention of vitamin D    deficiency: an Endocrine Society clinical practice    guideline. JCEM. 2011 Jul; 96(7):1911-30.    Thiamine 05/24/2023 CANCELED  nmol/L Final-Edited   Comment: Test not performed. No frozen whole blood received.  Result canceled by the ancillary.    Vitamin B-12 05/24/2023 541  232 - 1,245 pg/mL Final   specimen status report 05/24/2023 Comment   Preliminary   No Frozen Received   specimen status report 05/24/2023 Comment   Final   Comment: No Test Indicated NTI Miscellaneous Dear Doctor, The requisition we received for the above patient has no test indicated on the request form for one or more of the specimens submitted.  The [place] Code of [employer] requires a written and signed  request be forwarded to the testing laboratory following the verbal order of a laboratory test. Date:_________________________________ ICD-9/10 Diagnosis Code(s):___________________________________________ Physician or Authorized Designee Signature: ______________________________________________________________________ Your signature confirms your order of the test(s) listed Required test name(s):________________________________________________ Required test number(s):______________________________________________ Please provide requested information and fax to [phone] to expedite testing.     Allergies: Pineapple  Medications:  Facility Ordered Medications  Medication   acetaminophen (TYLENOL) tablet 650 mg  alum & mag hydroxide-simeth (MAALOX/MYLANTA) 200-200-20 MG/5ML suspension 30 mL   magnesium hydroxide (MILK OF MAGNESIA) suspension 30 mL   haloperidol (HALDOL) tablet 5 mg   And   diphenhydrAMINE (BENADRYL) capsule 50 mg   haloperidol lactate (HALDOL) injection 5 mg   And   diphenhydrAMINE (BENADRYL) injection 50 mg   And   LORazepam (ATIVAN) injection 2 mg   haloperidol lactate (HALDOL) injection 10 mg   And   diphenhydrAMINE (BENADRYL) injection 50 mg   And   LORazepam (ATIVAN) injection 2 mg   traZODone (DESYREL) tablet 50 mg   ARIPiprazole (ABILIFY) tablet 5 mg   FLUoxetine (PROZAC) capsule 20 mg   gabapentin (NEURONTIN) capsule 300 mg   nicotine (NICODERM CQ - dosed in mg/24 hours) patch 21 mg   PTA Medications  Medication Sig   sildenafil (VIAGRA) 100 MG tablet Take 1 tablet (100 mg total) by mouth daily as needed for erectile dysfunction.   nicotine (NICODERM CQ) 21 mg/24hr patch Place 1 patch (21 mg total) onto the skin daily.   nicotine polacrilex (NICORETTE) 4 MG gum Take 1 each (4 mg total) by mouth as needed for smoking cessation.   gabapentin (NEURONTIN) 300 MG capsule Take 1 capsule (300 mg total) by mouth 3 (three) times daily.   FLUoxetine  (PROZAC) 40 MG capsule Take 1 capsule (40 mg total) by mouth daily.   ARIPiprazole (ABILIFY) 5 MG tablet Take 1 tablet (5 mg total) by mouth daily.      Medical Decision Making  Alexandar is recommended for inpatient psychiatric hospitalization for stabilization of mood and safety. Alexandar is voluntary and agrees to admission. Per Linsey RN AC at [facility], he has a bed at [facility] pending initial workup to be completed here at [facility].   Restart home medications: -Abilify 5mg  by mouth daily for depression -Prozac 20mg  by mouth daily for depression (restarting this at 20mg  due to not having the 40mg  dosage in the past month) -Nicotine Patch daily for nicotine dependence -Gabapentin 300mg  capsule TID for anxiety  Labs ordered: -UDS -CBC -CMP -ETHANOL -HEMOGLOBIN A1C -LIPID PANEL -TSH  -EKG ordered as well.    Recommendations  Based on my evaluation the patient does not appear to have an emergency medical condition.  Cherre Blanc, NP 11/05/23  1:17 PM
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
## 2023-11-05 NOTE — Plan of Care (Signed)
   Problem: Education: Goal: Emotional status will improve Outcome: Not Progressing Goal: Mental status will improve Outcome: Not Progressing

## 2023-11-05 NOTE — ED Notes (Addendum)
 Patient is transferring to [facility] at this time via safe transport. Voluntary consent, EMTALA, and other transfer paperwork sent with patient and provided to transport. Patient A&OX4.  Calm and cooperative. Report provided to Vikki Ports, Charity fundraiser. All valuables/belongings sent with patient. Patient in no current distress.

## 2023-11-05 NOTE — [facility] Group Notes (Signed)
 [facility] Group Notes:  (Nursing/MHT/Case Management/Adjunct)  Date:  11/05/2023  Time:  8:50 PM  Type of Therapy:   Wrap-up group  Participation Level:  Did Not Attend  Participation Quality:    Affect:    Cognitive:    Insight:    Engagement in Group:    Modes of Intervention:    Summary of Progress/Problems: Pt refused to attend group.  Noah Delaine 11/05/2023, 8:50 PM
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
## 2023-11-05 NOTE — Discharge Instructions (Addendum)
 Accepted to [facility], attending MD Dr. Sherron Flemings.

## 2023-11-05 NOTE — Progress Notes (Signed)
   11/05/23 2100  Psych Admission Type (Psych Patients Only)  Admission Status Voluntary  Psychosocial Assessment  Patient Complaints Depression  Eye Contact Fair  Facial Expression Flat  Affect Appropriate to circumstance  Speech Logical/coherent  Interaction Minimal  Motor Activity Other (Comment) (WDL)  Appearance/Hygiene Unremarkable  Behavior Characteristics Cooperative;Appropriate to situation  Mood Depressed  Thought Process  Coherency WDL  Content WDL  Delusions None reported or observed  Perception WDL  Hallucination None reported or observed  Judgment WDL  Confusion None  Danger to Self  Current suicidal ideation? Denies  Agreement Not to Harm Self Yes  Description of Agreement verbal  Danger to Others  Danger to Others None reported or observed

## 2023-11-06 ENCOUNTER — Encounter (HOSPITAL_COMMUNITY): Payer: Self-pay

## 2023-11-06 ENCOUNTER — Ambulatory Visit (HOSPITAL_COMMUNITY): Payer: BC Managed Care – PPO | Admitting: Clinical

## 2023-11-06 MED ORDER — FLUOXETINE HCL 20 MG PO CAPS
20.0000 mg | ORAL_CAPSULE | Freq: Every day | ORAL | Status: AC
  Administered 2023-11-06: 20 mg via ORAL
  Filled 2023-11-06: qty 1

## 2023-11-06 MED ORDER — NICOTINE 14 MG/24HR TD PT24
14.0000 mg | MEDICATED_PATCH | Freq: Every day | TRANSDERMAL | Status: DC
  Administered 2023-11-06 – 2023-11-09 (×4): 14 mg via TRANSDERMAL
  Filled 2023-11-06 (×7): qty 1

## 2023-11-06 MED ORDER — VITAMIN D (ERGOCALCIFEROL) 1.25 MG (50000 UNIT) PO CAPS
50000.0000 [IU] | ORAL_CAPSULE | Freq: Once | ORAL | Status: AC
  Administered 2023-11-06: 50000 [IU] via ORAL
  Filled 2023-11-06 (×2): qty 1

## 2023-11-06 MED ORDER — FLUOXETINE HCL 20 MG PO CAPS
40.0000 mg | ORAL_CAPSULE | Freq: Every day | ORAL | Status: DC
Start: 2023-11-07 — End: 2023-11-09
  Administered 2023-11-07 – 2023-11-09 (×3): 40 mg via ORAL
  Filled 2023-11-06 (×5): qty 2

## 2023-11-06 NOTE — BH Assessment (Signed)
 Patient alert and oriented without complaints. Pt states he is improving. Denies SI/HI, denies depression and states anxiety is low. Patient was smiling when speaking to me. Calm and cooperative with steady gait.

## 2023-11-06 NOTE — Group Note (Signed)
 Date:  11/06/2023 Time:  4:29 PM  Group Topic/Focus:  Emotional Education:   The focus of this group is to discuss what feelings/emotions are, and how they are experienced. Managing Feelings:   The focus of this group is to identify what feelings patients have difficulty handling and develop a plan to handle them in a healthier way upon discharge.    Participation Level:  Active  Participation Quality:  Appropriate  Affect:  Appropriate  Cognitive:  Appropriate  Insight: Appropriate  Engagement in Group:  Engaged  Modes of Intervention:  Activity  Additional Comments:   Pt completed the emotional wellness activity.  Scott Gordon 11/06/2023, 4:29 PM
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
## 2023-11-06 NOTE — Plan of Care (Signed)
   Problem: Activity: Goal: Interest or engagement in activities will improve Outcome: Progressing   Problem: Coping: Goal: Ability to verbalize frustrations and anger appropriately will improve Outcome: Progressing   Problem: Safety: Goal: Periods of time without injury will increase Outcome: Progressing

## 2023-11-06 NOTE — BH IP Treatment Plan (Signed)
 Interdisciplinary Treatment and Diagnostic Plan Update  11/06/2023 Time of Session: 1121 Scott Gordon MRN: [id number]  Principal Diagnosis: Major depressive disorder  Secondary Diagnoses: Principal Problem:   Major depressive disorder Active Problems:   GAD (generalized anxiety disorder)   Current Medications:  Current Facility-Administered Medications  Medication Dose Route Frequency Provider Last Rate Last Admin   acetaminophen (TYLENOL) tablet 650 mg  650 mg Oral Q6H PRN Harless Litten, NP       alum & mag hydroxide-simeth (MAALOX/MYLANTA) 200-200-20 MG/5ML suspension 30 mL  30 mL Oral Q4H PRN Harless Litten, NP       ARIPiprazole (ABILIFY) tablet 5 mg  5 mg Oral Daily Harless Litten, NP   5 mg at 11/06/23 7253   haloperidol (HALDOL) tablet 5 mg  5 mg Oral TID PRN Harless Litten, NP       And   diphenhydrAMINE (BENADRYL) capsule 50 mg  50 mg Oral TID PRN Harless Litten, NP       haloperidol lactate (HALDOL) injection 5 mg  5 mg Intramuscular TID PRN Harless Litten, NP       And   diphenhydrAMINE (BENADRYL) injection 50 mg  50 mg Intramuscular TID PRN Harless Litten, NP       And   LORazepam (ATIVAN) injection 2 mg  2 mg Intramuscular TID PRN Harless Litten, NP       haloperidol lactate (HALDOL) injection 10 mg  10 mg Intramuscular TID PRN Harless Litten, NP       And   diphenhydrAMINE (BENADRYL) injection 50 mg  50 mg Intramuscular TID PRN Harless Litten, NP       And   LORazepam (ATIVAN) injection 2 mg  2 mg Intramuscular TID PRN Harless Litten, NP       [START ON 11/07/2023] FLUoxetine (PROZAC) capsule 40 mg  40 mg Oral Daily Peterson Ao, MD       gabapentin (NEURONTIN) capsule 300 mg  300 mg Oral TID Harless Litten, NP   300 mg at 11/06/23 1251   influenza vac split trivalent PF (FLULAVAL) injection 0.5 mL  0.5 mL Intramuscular Tomorrow-1000 Massengill, Nathan, MD       magnesium hydroxide (MILK OF MAGNESIA) suspension 30 mL  30 mL Oral Daily  PRN Harless Litten, NP       nicotine (NICODERM CQ - dosed in mg/24 hours) patch 14 mg  14 mg Transdermal Daily Peterson Ao, MD   14 mg at 11/06/23 1251   pneumococcal 20-valent conjugate vaccine (PREVNAR 20) injection 0.5 mL  0.5 mL Intramuscular Tomorrow-1000 Massengill, Harrold Donath, MD       traZODone (DESYREL) tablet 50 mg  50 mg Oral QHS PRN Harless Litten, NP       PTA Medications: Medications Prior to Admission  Medication Sig Dispense Refill Last Dose/Taking   gabapentin (NEURONTIN) 300 MG capsule Take 1 capsule (300 mg total) by mouth 3 (three) times daily. 90 capsule 1 Taking   nicotine (NICODERM CQ) 21 mg/24hr patch Place 1 patch (21 mg total) onto the skin daily. 28 patch 1 Taking   ARIPiprazole (ABILIFY) 5 MG tablet Take 1 tablet (5 mg total) by mouth daily. (Patient not taking: Reported on 11/06/2023) 90 tablet 0 Not Taking   FLUoxetine (PROZAC) 40 MG capsule Take 40 mg by mouth daily. (Patient not taking: Reported on 11/06/2023)   Not Taking   nicotine polacrilex (NICORETTE) 4 MG gum Take 1 each (4 mg total) by mouth as needed for smoking cessation. (Patient not taking: Reported  on 11/06/2023) 110 tablet 0 Not Taking    Patient Stressors: Financial difficulties   Occupational concerns    Patient Strengths: Ability for insight  Capable of independent living  Communication skills  Motivation for treatment/growth  Supportive family/friends   Treatment Modalities: Medication Management, Group therapy, Case management,  1 to 1 session with clinician, Psychoeducation, Recreational therapy.   Physician Treatment Plan for Primary Diagnosis: Major depressive disorder Long Term Goal(s):     Short Term Goals:    Medication Management: Evaluate patient's response, side effects, and tolerance of medication regimen.  Therapeutic Interventions: 1 to 1 sessions, Unit Group sessions and Medication administration.  Evaluation of Outcomes: Not Progressing  Physician Treatment Plan for  Secondary Diagnosis: Principal Problem:   Major depressive disorder Active Problems:   GAD (generalized anxiety disorder)  Long Term Goal(s):     Short Term Goals:       Medication Management: Evaluate patient's response, side effects, and tolerance of medication regimen.  Therapeutic Interventions: 1 to 1 sessions, Unit Group sessions and Medication administration.  Evaluation of Outcomes: Not Progressing   RN Treatment Plan for Primary Diagnosis: Major depressive disorder Long Term Goal(s): Knowledge of disease and therapeutic regimen to maintain health will improve  Short Term Goals: Ability to remain free from injury will improve, Ability to demonstrate self-control, Ability to participate in decision making will improve, Ability to identify and develop effective coping behaviors will improve, and Compliance with prescribed medications will improve  Medication Management: RN will administer medications as ordered by provider, will assess and evaluate patient's response and provide education to patient for prescribed medication. RN will report any adverse and/or side effects to prescribing provider.  Therapeutic Interventions: 1 on 1 counseling sessions, Psychoeducation, Medication administration, Evaluate responses to treatment, Monitor vital signs and CBGs as ordered, Perform/monitor CIWA, COWS, AIMS and Fall Risk screenings as ordered, Perform wound care treatments as ordered.  Evaluation of Outcomes: Not Progressing   LCSW Treatment Plan for Primary Diagnosis: Major depressive disorder Long Term Goal(s): Safe transition to appropriate next level of care at discharge, Engage patient in therapeutic group addressing interpersonal concerns.  Short Term Goals: Engage patient in aftercare planning with referrals and resources, Increase ability to appropriately verbalize feelings, Increase emotional regulation, Facilitate patient progression through stages of change regarding substance  use diagnoses and concerns, and Increase skills for wellness and recovery  Therapeutic Interventions: Assess for all discharge needs, 1 to 1 time with Social worker, Explore available resources and support systems, Assess for adequacy in community support network, Educate family and significant other(s) on suicide prevention, Complete Psychosocial Assessment, Interpersonal group therapy.  Evaluation of Outcomes: Not Progressing   Progress in Treatment: Attending groups: Yes. Participating in groups: Yes. Taking medication as prescribed: Yes. Toleration medication: Yes. Family/Significant other contact made: No, will contact:  CSW assessment pending Patient understands diagnosis: Yes. Discussing patient identified problems/goals with staff: Yes. Medical problems stabilized or resolved: Yes. Denies suicidal/homicidal ideation: Yes. Issues/concerns per patient self-inventory: No. Other: n/a  New problem(s) identified: No, Describe:  None  New Short Term/Long Term Goal(s): medication stabilization, elimination of SI thoughts, development of comprehensive mental wellness plan.    Patient Goals: "Hoping to not have thoughts of hopelessness"  Discharge Plan or Barriers: Patient recently admitted. CSW will continue to follow and assess for appropriate referrals and possible discharge planning.    Reason for Continuation of Hospitalization: Anxiety Medication stabilization Other; describe mood stabilization, discharge planning  Estimated Length of Stay: 3-5 DAYS  Last 3 [place] Suicide Severity Risk Score: Flowsheet Row [facility] (Current[facility] Most recent reading at 11/05/2023  6:27 PM ED from 11/05/2023 in [facility] Most recent reading at 11/05/2023  2:14 PM Counselor from 08/14/2023 in [facility] [place] Most recent reading at 08/14/2023  1:10 PM  C-SSRS RISK  CATEGORY Low Risk Low Risk Low Risk       Last PHQ 2/9 Scores:    08/14/2023    1:10 PM 12/19/2022    2:51 PM 11/23/2022    2:09 PM  Depression screen PHQ 2/9  Decreased Interest 3 1 2   Down, Depressed, Hopeless 2 1 2   PHQ - 2 Score 5 2 4   Altered sleeping 2 3 3   Tired, decreased energy 3 2 3   Change in appetite 1 1 2   Feeling bad or failure about yourself  2 1 3   Trouble concentrating 3 2 3   Moving slowly or fidgety/restless 1 0 0  Suicidal thoughts 1 1 1   PHQ-9 Score 18 12 19   Difficult doing work/chores Extremely dIfficult Somewhat difficult Very difficult    Scribe for Treatment Team: Jacinta Shoe, LCSW 11/06/2023 1:49 PM

## 2023-11-06 NOTE — Group Note (Signed)
 Date:  11/06/2023 Time:  1:04 PM  Group Topic/Focus:  Goals Group:   The focus of this group is to help patients establish daily goals to achieve during treatment and discuss how the patient can incorporate goal setting into their daily lives to aide in recovery. Orientation:   The focus of this group is to educate the patient on the purpose and policies of crisis stabilization and provide a format to answer questions about their admission.  The group details unit policies and expectations of patients while admitted.    Participation Level:  Active  Participation Quality:  Appropriate  Affect:  Appropriate  Cognitive:  Appropriate  Insight: Appropriate  Engagement in Group:  Engaged  Modes of Intervention:  Activity and Orientation  Additional Comments:   Pt attentive and cooperative during orientation. Pt completed the goals activity.   Edmund Hilda Atonya Templer 11/06/2023, 1:04 PM

## 2023-11-06 NOTE — Progress Notes (Signed)
   11/06/23 0553  15 Minute Checks  Location Bedroom  Visual Appearance Calm  Behavior Sleeping  Sleep (Behavioral Health Patients Only)  Calculate sleep? (Click Yes once per 24 hr at 0600 safety check) Yes  Documented sleep last 24 hours 7.75

## 2023-11-06 NOTE — Group Note (Signed)
 Recreation Therapy Group Note   Group Topic:Communication  Group Date: 11/06/2023 Start Time: 0934 End Time: 1002 Facilitators: Tonea Leiphart-McCall, LRT,CTRS Location: 300 Hall Dayroom   Group Topic: Communication, Problem Solving   Goal Area(s) Addresses:  Patient will effectively listen to complete activity.  Patient will identify communication skills used to make activity successful.  Patient will identify how skills used during activity can be used to reach post d/c goals.    Intervention: Building surveyor Activity - Geometric pattern cards, pencils, blank paper    Activity: Geometric Drawings.  Three volunteers from the peer group will be shown an abstract picture with a particular arrangement of geometrical shapes.  Each round, one 'speaker' will describe the pattern, as accurately as possible without revealing the image to the group.  The remaining group members will listen and draw the picture to reflect how it is described to them. Patients with the role of 'listener' cannot ask clarifying questions but, may request that the speaker repeat a direction. Once the drawings are complete, the presenter will show the rest of the group the picture and compare how close each person came to drawing the picture. LRT will facilitate a post-activity discussion regarding effective communication and the importance of planning, listening, and asking for clarification in daily interactions with others.   Education: Environmental consultant, Active listening, Support systems, Discharge planning  Education Outcome: Acknowledges understanding/In group clarification offered/Needs additional education.    Affect/Mood: Appropriate   Participation Level: Engaged   Participation Quality: Independent   Behavior: Appropriate   Speech/Thought Process: Focused   Insight: Good   Judgement: Good   Modes of Intervention: Activity   Patient Response to Interventions:  Engaged   Education  Outcome:  In group clarification offered    Clinical Observations/Individualized Feedback: Pt was engaged attentive. Pt was called out of group at one point but later returned. Pt focused to try and complete the drawings based on the instructions of the presenters. Pt was bright throughout group session.     Plan: Continue to engage patient in RT group sessions 2-3x/week.   Scott Gordon, LRT,CTRS 11/06/2023 12:03 PM

## 2023-11-06 NOTE — Plan of Care (Signed)
   Problem: Education: Goal: Emotional status will improve Outcome: Progressing Goal: Mental status will improve Outcome: Progressing

## 2023-11-06 NOTE — Group Note (Signed)
 Date:  11/06/2023 Time:  9:32 PM  Group Topic/Focus:  Alcoholics Anonymous (AA) Meeting    Participation Level:  Active  Participation Quality:  Appropriate  Affect:  Appropriate  Cognitive:  Appropriate  Insight: Appropriate  Engagement in Group:  Engaged  Modes of Intervention:  Discussion and Support  Additional Comments:  Patient attended AA meeting.   [name] Francois 11/06/2023, 9:32 PM
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
## 2023-11-06 NOTE — Progress Notes (Signed)
   11/06/23 1000  Psych Admission Type (Psych Patients Only)  Admission Status Voluntary  Psychosocial Assessment  Patient Complaints Depression  Eye Contact Fair  Facial Expression Flat  Affect Appropriate to circumstance  Speech Logical/coherent  Interaction Minimal  Motor Activity Other (Comment) (WDL)  Appearance/Hygiene Unremarkable  Behavior Characteristics Cooperative;Appropriate to situation  Mood Depressed  Thought Process  Coherency WDL  Content WDL  Delusions None reported or observed  Perception WDL  Hallucination None reported or observed  Judgment WDL  Confusion None  Danger to Self  Current suicidal ideation? Denies  Agreement Not to Harm Self Yes  Description of Agreement Verbal  Danger to Others  Danger to Others None reported or observed

## 2023-11-06 NOTE — Group Note (Signed)
 Date:  11/06/2023 Time:  6:24 PM  Group Topic/Focus:  Dimensions of Wellness:   The focus of this group is to introduce the topic of wellness and discuss the role each dimension of wellness plays in total health. Overcoming Stress:   The focus of this group is to define stress and help patients assess their triggers.    Participation Level:  Did Not Attend  Participation Quality:   n/a  Affect:   n/a  Cognitive:   n/a  Insight: None  Engagement in Group:   n/a  Modes of Intervention:   n/a  Additional Comments:   Did not attend.   Edmund Hilda Chinonso Linker 11/06/2023, 6:24 PM

## 2023-11-07 NOTE — Progress Notes (Signed)
   11/07/23 0542  15 Minute Checks  Location Bedroom  Visual Appearance Calm  Behavior Sleeping  Sleep (Behavioral Health Patients Only)  Calculate sleep? (Click Yes once per 24 hr at 0600 safety check) Yes
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
## 2023-11-07 NOTE — Group Note (Signed)
 Date:  11/07/2023 Time:  4:25 PM  Group Topic/Focus:  Mental Health Association Group/ Coping Skills    Participation Level:  Active  Participation Quality:  Appropriate  Affect:  Appropriate  Cognitive:  Appropriate  Insight: Appropriate  Engagement in Group:  Engaged  Modes of Intervention:  Education and Support  Additional Comments:   Pt attended and participated in the [facility] group.  Scott Gordon 11/07/2023, 4:25 PM

## 2023-11-07 NOTE — Group Note (Signed)
 Date:  11/07/2023 Time:  1:48 PM  Group Topic/Focus:  Goals Group:   The focus of this group is to help patients establish daily goals to achieve during treatment and discuss how the patient can incorporate goal setting into their daily lives to aide in recovery. Orientation:   The focus of this group is to educate the patient on the purpose and policies of crisis stabilization and provide a format to answer questions about their admission.  The group details unit policies and expectations of patients while admitted.    Participation Level:  Did Not Attend  Participation Quality:  n/a  Affect:   n/a  Cognitive:   n/a  Insight: n/a  Engagement in Group:   n/a  Modes of Intervention:   n/a  Additional Comments:   Did not attend.   Edmund Hilda Brandalynn Ofallon 11/07/2023, 1:48 PM

## 2023-11-07 NOTE — Group Note (Signed)
 Recreation Therapy Group Note   Group Topic:Animal Assisted Therapy   Group Date: 11/07/2023 Start Time: 0945 End Time: 1030 Facilitators: Serina Nichter-McCall, LRT,CTRS Location: [address] Dayroom   Animal-Assisted Activity (AAA) Program Checklist/Progress Notes Patient Eligibility Criteria Checklist & Daily Group note for Rec Tx Intervention  AAA/T Program Assumption of Risk Form signed by Patient/ or Parent Legal Guardian Yes  Patient understands his/her participation is voluntary Yes  Education: Charity fundraiser, Appropriate Animal Interaction   Education Outcome: Acknowledges education.    Affect/Mood: N/A   Participation Level: Did not attend    Clinical Observations/Individualized Feedback:     Plan: Continue to engage patient in RT group sessions 2-3x/week.   Berish Bohman-McCall, LRT,CTRS 11/07/2023 12:13 PM

## 2023-11-07 NOTE — Plan of Care (Signed)
   Problem: Education: Goal: Emotional status will improve Outcome: Progressing Goal: Mental status will improve Outcome: Progressing   Problem: Activity: Goal: Interest or engagement in activities will improve Outcome: Progressing Goal: Sleeping patterns will improve Outcome: Progressing

## 2023-11-07 NOTE — Group Note (Addendum)
 LCSW Group Therapy Note  Group Date: 11/07/2023 Start Time: 1300 End Time: 1400  Participation:  did not attend  Type of Therapy:  Group Therapy   Title:  Stronger Together:  Building Healthy Relationships  Objective: To explore loneliness, boundaries, and safe ways to build relationships.  Goals: Recognize healthy vs. unhealthy relationships. Learn safe ways to connect with others. Strengthen communication and [employer].  Summary: Participants discussed loneliness, healthy connections, and setting boundaries. They explored safe ways to meet people and shared personal experiences. Key insights were reinforced through discussion and quotes.  Therapeutic Modalities Used: Cognitive Behavioral Therapy (CBT) - elements - identifying unhealthy relationship patterns, challenging negative thoughts about connection Dialectical Behavior Therapy (DBT) - elements - setting and maintaining boundaries Supportive Group Therapy - Peer discussion, shared experiences, and emotional validation.   Scott Gordon Elizeth Weinrich, LCSWA 11/07/2023  7:15 PM

## 2023-11-07 NOTE — Plan of Care (Signed)
   Problem: Education: Goal: Emotional status will improve Outcome: Progressing Goal: Mental status will improve Outcome: Progressing   Problem: Activity: Goal: Interest or engagement in activities will improve Outcome: Progressing Goal: Sleeping patterns will improve Outcome: Progressing   Problem: Safety: Goal: Periods of time without injury will increase Outcome: Progressing

## 2023-11-07 NOTE — Progress Notes (Addendum)
 Pt denied SI/HI/AVH this morning. Pt denied having anxiety or depression this morning. Pt has been pleasant, calm, and cooperative throughout the shift.RN provided support and encouragement to patient. Pt given scheduled medications as prescribed. Q15 min checks verified for safety. Patient verbally contracts for safety. Patient compliant with medications and treatment plan. Patient is interacting well on the unit. Pt is safe on the unit.   11/07/23 0907  Psych Admission Type (Psych Patients Only)  Admission Status Voluntary  Psychosocial Assessment  Patient Complaints None  Eye Contact Fair  Facial Expression Anxious;Sad  Affect Appropriate to circumstance  Speech Logical/coherent  Interaction Assertive  Motor Activity Other (Comment) (WDL)  Appearance/Hygiene Unremarkable  Behavior Characteristics Cooperative;Appropriate to situation  Mood Pleasant  Thought Process  Coherency WDL  Content WDL  Delusions None reported or observed  Perception WDL  Hallucination None reported or observed  Judgment WDL  Confusion None  Danger to Self  Current suicidal ideation? Denies  Description of Suicide Plan No plan  Self-Injurious Behavior No self-injurious ideation or behavior indicators observed or expressed   Agreement Not to Harm Self Yes  Description of Agreement verbal  Danger to Others  Danger to Others None reported or observed

## 2023-11-07 NOTE — Group Note (Signed)
 Date:  11/07/2023 Time:  8:59 PM  Group Topic/Focus:  Wrap-Up Group:   The focus of this group is to help patients review their daily goal of treatment and discuss progress on daily workbooks.    Participation Level:  Active  Participation Quality:  Appropriate and Attentive  Affect:  Appropriate  Cognitive:  Alert and Appropriate  Insight: Appropriate and Good  Engagement in Group:  Engaged  Modes of Intervention:  Discussion and Education  Additional Comments:  Pt attended and participated in wrap up group this evening and rated their day an 8/10. Pt stated that they have spent their day thinking about the steps that they need to take after they are D/C. Pt anticipates being D/C on Thursday and states that while they have been here they have learned how to make "smart goals" for themself.   Chrisandra Netters 11/07/2023, 8:59 PM
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
## 2023-11-08 NOTE — Group Note (Signed)
 Recreation Therapy Group Note   Group Topic:Health and Wellness  Group Date: 11/08/2023 Start Time: 0935 End Time: 1010 Facilitators: Charlestine Rookstool-McCall, LRT,CTRS Location: [address] Dayroom   Group Topic: Wellness  Goal Area(s) Addresses:  Patient will define components of whole wellness. Patient will verbalize benefit of whole wellness.  Intervention: Worksheets  Activity: Financial controller. LRT and patients discussed the components of wellness (mental, physical and spiritual). LRT and patients also discussed the importance of wellness and how it affects [place] on a daily basis. LRT then gave patients two worksheets of brain teasers. LRT explained to patients instead of doing physical exercise, they were going to exercise their brains and solve the brain teasers presented on the worksheets. Patients were given 20 minutes to decode as many of brain teasers they could before they went over them as a group   Education: Wellness, Building control surveyor.   Education Outcome: Acknowledges education/In group clarification offered/Needs additional education.   Affect/Mood: N/A   Participation Level: Did not attend    Clinical Observations/Individualized Feedback:      Plan: Continue to engage patient in RT group sessions 2-3x/week.   Raghav Verrilli-McCall, LRT,CTRS 11/08/2023 12:37 PM
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
## 2023-11-08 NOTE — [facility] Group Notes (Signed)
 Spirituality Group Focus of discussion: Gratitude and Strength Awareness  Process: Following theoretical framework of group therapy of Chyrl Civatte and further informed by Rogerian and Relational Cultural Theory approaches, participants invited to name:  Sources of gratitude (internal>external) Articulate gratitude for self Name a personal strength/gift/skill Locate points of resonance among group members/engage the "here and now" Conclude with grounding/breathwork  Observations: Scott Gordon was present for the full group time. He was an active participant.  Scott Gordon L. Sophronia Simas, M.Div [phone]

## 2023-11-08 NOTE — Progress Notes (Signed)
   11/08/23 1400  Psych Admission Type (Psych Patients Only)  Admission Status Voluntary  Psychosocial Assessment  Patient Complaints None  Eye Contact Fair  Facial Expression Anxious  Affect Appropriate to circumstance  Speech Logical/coherent  Interaction Assertive  Motor Activity Other (Comment)  Appearance/Hygiene Unremarkable  Behavior Characteristics Appropriate to situation  Mood Pleasant  Thought Process  Coherency WDL  Content WDL  Delusions None reported or observed  Perception WDL  Hallucination None reported or observed  Judgment WDL  Confusion None  Danger to Self  Current suicidal ideation? Denies  Danger to Others  Danger to Others None reported or observed

## 2023-11-08 NOTE — [facility] Group Notes (Signed)
Patient did not attend the NA group. 

## 2023-11-08 NOTE — Plan of Care (Signed)
   Problem: Education: Goal: Emotional status will improve Outcome: Progressing Goal: Mental status will improve Outcome: Progressing   Problem: Activity: Goal: Interest or engagement in activities will improve Outcome: Progressing

## 2023-11-08 NOTE — Progress Notes (Signed)
   11/07/23 2100  Psych Admission Type (Psych Patients Only)  Admission Status Voluntary  Psychosocial Assessment  Patient Complaints Anxiety;Depression (Anxiety 2/10, Depression 4/10)  Eye Contact Fair  Facial Expression Anxious  Affect Appropriate to circumstance  Speech Logical/coherent  Interaction Assertive  Motor Activity Other (Comment) (Unremarkable)  Appearance/Hygiene Unremarkable  Behavior Characteristics Cooperative;Appropriate to situation  Mood Pleasant  Thought Process  Coherency WDL  Content WDL  Delusions None reported or observed  Perception WDL  Hallucination None reported or observed  Judgment WDL  Confusion None  Danger to Self  Current suicidal ideation? Denies  Self-Injurious Behavior No self-injurious ideation or behavior indicators observed or expressed   Agreement Not to Harm Self Yes  Description of Agreement Verbal  Danger to Others  Danger to Others None reported or observed

## 2023-11-09 MED ORDER — NICOTINE 14 MG/24HR TD PT24: 14.0000 mg | MEDICATED_PATCH | Freq: Every day | TRANSDERMAL | 0 refills | Status: AC

## 2023-11-09 MED ORDER — ARIPIPRAZOLE 5 MG PO TABS: 5.0000 mg | ORAL_TABLET | Freq: Every day | ORAL | 0 refills | Status: DC

## 2023-11-09 MED ORDER — NICOTINE POLACRILEX 4 MG MT GUM: 4.0000 mg | CHEWING_GUM | OROMUCOSAL | 0 refills | Status: DC | PRN

## 2023-11-09 MED ORDER — FLUOXETINE HCL 40 MG PO CAPS: 40.0000 mg | ORAL_CAPSULE | Freq: Every day | ORAL | 0 refills | Status: DC

## 2023-11-09 NOTE — Discharge Instructions (Signed)

## 2023-11-09 NOTE — [facility] Suicide Risk Assessment (Signed)
 [facility] INPATIENT:  Family/Significant Other Suicide Prevention Education  Suicide Prevention Education:  Education Completed; Scott Gordon - mother [phone]), has been identified by the patient as the family member/significant other with whom the patient will be residing, and identified as the person(s) who will aid the patient in the event of a mental health crisis (suicidal ideations/suicide attempt).  With written consent from the patient, the family member/significant other has been provided the following suicide prevention education, prior to the and/or following the discharge of the patient.  Confirmed with mother that she is able to provide transportation for Pt at discharge, with tentative pick-up time of 1230. Treatment team notified of SPE completion.   The suicide prevention education provided includes the following: Suicide risk factors Suicide prevention and interventions National Suicide Hotline telephone number [facility] assessment telephone number [facility] Emergency Assistance 911 [facility] and/or Residential Mobile Crisis Unit telephone number  Request made of family/significant other to: Remove weapons (e.g., guns, rifles, knives), all items previously/currently identified as safety concern.   Remove drugs/medications (over-the-counter, prescriptions, illicit drugs), all items previously/currently identified as a safety concern.  The family member/significant other verbalizes understanding of the suicide prevention education information provided.  The family member/significant other agrees to remove the items of safety concern listed above.  Scott Oms Desarea [place], LCSW 11/09/2023, 8:09 AM

## 2023-11-09 NOTE — Discharge Summary (Signed)
 Physician Discharge Summary Note  Patient:  Scott Gordon is an 33 y.o., male MRN:  [id number] DOB:  08/05/1991 Patient phone:  [phone] (home)  Patient address:   [address] [place] [address],  Total Time spent with patient: 30 minutes  Date of Admission:  11/05/2023 Date of Discharge: 11/09/2023  Reason for Admission:    Scott Gordon is a 57 y.o., male with a past psychiatric history of MDD, GAD and marijuana use who presents to the [facility] Voluntary from behavioral health urgent care [facility]) for evaluation and management of worsening depression and suicidal thoughts without a plan.   Principal Problem: Major depressive disorder Discharge Diagnoses: Principal Problem:   Major depressive disorder Active Problems:   GAD (generalized anxiety disorder)  Past Psychiatric History:  Previous psych diagnoses: MDD, GAD and ADHD ( not on any medications currently)  Prior inpatient psychiatric treatment: [facility] 09/28/2021-09/30/2021 Prior outpatient psychiatric treatment:  Yes Current psychiatric provider:  Dr. Hazle Quant, [facility]    Neuromodulation history: denies   Current therapist:  Yes, however, patient currently switched insurances and can't afford sessions  Psychotherapy hx:  Yes    History of suicide attempts: Denies History of homicide: Denies   Psychotropic medications: Current Abilify 5 mg daily  Prozac 40 mg daily  - patient reportedly not taking due to financial difficulties and loss of insurance , reports fair response   Past Atomoxetine 40 mg daily  Buproprion (migraine, memory loss)  Duloxetine  60 mg daily (overflow incontinence)  Hydroxyzine 10 mg TID as needed  Seroquel 50 mg at bedtime Trazodone 100 mg at bedtime  Venlafaxine 150 mg daily  (ineffective)    Substance Use History: Alcohol: socially, last drink Wednesday  Hx withdrawal tremors/shakes: denies Hx alcohol related blackouts: denies Hx  alcohol induced hallucinations: denies Hx alcoholic seizures: denies Hx medical hospitalization due to severe alcohol withdrawal symptoms: denies DUI: denies   --------   Tobacco: endorses, current, smokes 1 packs per 2 weeks  and endorses, current, 1 pen on vaping per 2 weeks  Cannabis (marijuana): THC vapes every 2 weeks  Cocaine: Denies  Methamphetamines: Denies Psilocybin (mushrooms): Denies Ecstasy (MDMA / molly): Denies LSD (acid): Denies Opiates (fentanyl / heroin): Denies Benzos (Xanax, Klonopin): Denies IV drug use: Denies Prescribed meds abuse: Denies   History of detox: denies History of rehab: denies   Past Medical History:  Past Medical History:  Diagnosis Date   ADHD, predominantly inattentive type    Anxiety    Depression    Nicotine dependence     Past Surgical History:  Procedure Laterality Date   WISDOM TOOTH EXTRACTION     Family History:  Family History  Problem Relation Age of Onset   Diabetes Mother    Non-Hodgkin's lymphoma Mother    Hypertension Father    Diabetes Father    Bone cancer Paternal Aunt    Family Psychiatric  History:  Psych: Sister, Depression  Psych Rx: Unaware  Suicide: Paternal Uncle  Homicide: Denies  Substance use family hx: Paternal aunt and uncle, undisclosed substance history  Social History:  Social History   Substance and Sexual Activity  Alcohol Use Yes   Comment: social alcohol use     Social History   Substance and Sexual Activity  Drug Use Yes   Types: Marijuana    Social History   Socioeconomic History   Marital status: Significant Other    Spouse name: Not on file   Number of children:  Not on file   Years of education: Not on file   Highest education level: Not on file  Occupational History   Not on file  Tobacco Use   Smoking status: Every Day    Current packs/day: 0.25    Average packs/day: 0.3 packs/day for 3.0 years (0.8 ttl pk-yrs)    Types: Cigarettes   Smokeless tobacco: Never    Tobacco comments:    Previously used vapes, switched to cigarettes about 3 years ago due to cost.  Vaping Use   Vaping status: Former   Substances: Nicotine, Flavoring  Substance and Sexual Activity   Alcohol use: Yes    Comment: social alcohol use   Drug use: Yes    Types: Marijuana   Sexual activity: Yes    Birth control/protection: Condom  Other Topics Concern   Not on file  Social History Narrative   Lives with mother and sister. He is polyamorous and has 2 significant others. Currently unemployed, in school for IT. Independent in ADLs and IADLs.    Social Drivers of Corporate investment banker Strain: Low Risk  (04/06/2022)   Overall Financial Resource Strain (CARDIA)    Difficulty of Paying Living Expenses: Not hard at all  Food Insecurity: No Food Insecurity (11/05/2023)   Hunger Vital Sign    Worried About Running Out of Food in the Last Year: Never true    Ran Out of Food in the Last Year: Never true  Transportation Needs: No Transportation Needs (11/05/2023)   PRAPARE - Administrator, Civil Service (Medical): No    Lack of Transportation (Non-Medical): No  Physical Activity: Sufficiently Active (04/06/2022)   Exercise Vital Sign    Days of Exercise per Week: 3 days    Minutes of Exercise per Session: 90 min  Stress: Stress Concern Present (04/06/2022)   [employer] of Occupational Health - Occupational Stress Questionnaire    Feeling of Stress : Very much  Social Connections: Socially Isolated (11/05/2023)   Social Connection and Isolation Panel [NHANES]    Frequency of Communication with Friends and Family: Never    Frequency of Social Gatherings with Friends and Family: More than three times a week    Attends Religious Services: Never    Database administrator or Organizations: No    Attends Banker Meetings: Never    Marital Status: Never married   Hospital Course:    During the patient's hospitalization, patient had extensive initial  psychiatric evaluation, and follow-up psychiatric evaluations every day.   Psychiatric diagnoses provided upon initial assessment:  Major Depressive Disorder  Generalized Anxiety Disorder  Marijuana Use    Patient's psychiatric medications were adjusted on admission:  - Restarted Abilify 5 mg for depression, patient discontinued due to change in insurance  - Restarted Fluoxetine 40 mg for depression, patient discontinued due to change in insurance - Continued Gabapentin 300 mg three times daily for anxiety  - Started Nicotine Patch 14 mg for nicotine dependence    During the hospitalization, other adjustments were made to the patient's psychiatric medication regimen: None    Patient's care was discussed during the interdisciplinary team meeting every day during the hospitalization.   The patient denied having side effects to prescribed psychiatric medication.   Gradually, patient started adjusting to milieu. The patient was evaluated each day by a clinical provider to ascertain response to treatment. Improvement was noted by the patient's report of decreasing symptoms, improved sleep and appetite, affect, medication tolerance,  behavior, and participation in unit programming.  Patient was asked each day to complete a self inventory noting mood, mental status, pain, new symptoms, anxiety and concerns.     Symptoms were reported as significantly decreased or resolved completely by discharge.    On day of discharge, the patient reports that their mood is stable. The patient denied having suicidal thoughts for more than 48 hours prior to discharge.  Patient denies having homicidal thoughts.  Patient denies having auditory hallucinations.  Patient denies any visual hallucinations or other symptoms of psychosis. The patient was motivated to continue taking medication with a goal of continued improvement in mental health.    The patient reports their target psychiatric symptoms of depression,  anxiety and suicidal ideations responded well to the psychiatric medications, and the patient reports overall benefit other psychiatric hospitalization. Supportive psychotherapy was provided to the patient. The patient also participated in regular group therapy while hospitalized. Coping skills, problem solving as well as relaxation therapies were also part of the unit programming.   Labs were reviewed with the patient, and abnormal results were discussed with the patient.   The patient is able to verbalize their individual safety plan to this provider.   # It is recommended to the patient to continue psychiatric medications as prescribed, after discharge from the hospital.     # It is recommended to the patient to follow up with your outpatient psychiatric provider and PCP.   # It was discussed with the patient, the impact of alcohol, drugs, tobacco have been there overall psychiatric and medical wellbeing, and total abstinence from substance use was recommended the patient.ed.   # Prescriptions provided or sent directly to preferred pharmacy at discharge. Patient agreeable to plan. Given opportunity to ask questions. Appears to feel comfortable with discharge.    # In the event of worsening symptoms, the patient is instructed to call the crisis hotline, 911 and or go to the nearest ED for appropriate evaluation and treatment of symptoms. To follow-up with primary care provider for other medical issues, concerns and or health care needs   # Patient was discharged home with a plan to follow up as noted below.  Physical Findings: AIMS:  , ,  ,  ,    CIWA:    COWS:     Musculoskeletal: Strength & Muscle Tone: within normal limits Gait & Station: normal Patient leans: N/A   Psychiatric Specialty Exam:  Presentation  General Appearance:  Appropriate for Environment; Casual  Eye Contact: Good  Speech: Blocked; Normal Rate  Speech Volume: Normal  Handedness: Right   Mood and  Affect  Mood: Euthymic  Affect: Appropriate; Congruent; Full Range   Thought Process  Thought Processes: Coherent; Linear  Descriptions of Associations:Intact  Orientation:Full (Time, Place and Person)  Thought Content:Logical  History of Schizophrenia/Schizoaffective disorder:No  Duration of Psychotic Symptoms:No data recorded Hallucinations:Hallucinations: None  Ideas of Reference:None  Suicidal Thoughts:Suicidal Thoughts: No SI Passive Intent and/or Plan: Without Intent; Without Plan  Homicidal Thoughts:Homicidal Thoughts: No   Sensorium  Memory: Immediate Good; Recent Good  Judgment: Good  Insight: Good   Executive Functions  Concentration: Good  Attention Span: Good  Recall: Good  Fund of Knowledge: Good  Language: Good   Psychomotor Activity  Psychomotor Activity: Psychomotor Activity: Normal   Assets  Assets: Communication Skills; Desire for Improvement; Financial Resources/Insurance; Housing; Intimacy; Resilience; Social Support; Vocational/Educational   Sleep  Sleep: Sleep: Good Number of Hours of Sleep: 9  Physical Exam: Constitutional:  Appearance: Normal appearance.  Eyes:     Conjunctiva/sclera: Conjunctivae normal.  Pulmonary:     Effort: Pulmonary effort is normal.  Musculoskeletal:        General: Normal range of motion.  Neurological:     Mental Status: He is alert and oriented to person, place, and time.      Review of Systems  Constitutional:  Negative for chills and fever.  Cardiovascular:  Negative for chest pain.  Gastrointestinal:  Negative for nausea and vomiting.  Neurological:  Negative for headaches.      Blood pressure 114/75, pulse 62, temperature 98.4 F (36.9 C), temperature source Oral, resp. rate 16, height 5\' 7"  (1.702 m), weight 73.6 kg, SpO2 100%. Body mass index is 25.4 kg/m.   Social History   Tobacco Use  Smoking Status Every Day   Current packs/day: 0.25   Average  packs/day: 0.3 packs/day for 3.0 years (0.8 ttl pk-yrs)   Types: Cigarettes  Smokeless Tobacco Never  Tobacco Comments   Previously used vapes, switched to cigarettes about 3 years ago due to cost.   Tobacco Cessation:  A prescription for an FDA-approved tobacco cessation medication provided at discharge   Blood Alcohol level:  Lab Results  Component Value Date   [facility] <10 11/05/2023   ETH <10 09/28/2021    Metabolic Disorder Labs:  Lab Results  Component Value Date   HGBA1C 5.2 11/05/2023   MPG 102.54 11/05/2023   MPG 108 09/28/2021   No results found for: "PROLACTIN" Lab Results  Component Value Date   CHOL 181 11/05/2023   TRIG 69 11/05/2023   HDL 63 11/05/2023   CHOLHDL 2.9 11/05/2023   VLDL 14 11/05/2023   LDLCALC 104 (H) 11/05/2023   LDLCALC 111 (H) 12/19/2022    See Psychiatric Specialty Exam and Suicide Risk Assessment completed by Attending Physician prior to discharge.  Discharge destination:  Home  Is patient on multiple antipsychotic therapies at discharge:  No   Has Patient had three or more failed trials of antipsychotic monotherapy by history:  No  Recommended Plan for Multiple Antipsychotic Therapies: NA   Allergies as of 11/09/2023       Reactions   Pineapple Hives        Medication List     STOP taking these medications    nicotine 21 mg/24hr patch Commonly known as: Nicoderm CQ Replaced by: nicotine 14 mg/24hr patch       TAKE these medications      Indication  ARIPiprazole 5 MG tablet Commonly known as: Abilify Take 1 tablet (5 mg total) by mouth daily.  Indication: Major Depressive Disorder   FLUoxetine 40 MG capsule Commonly known as: PROZAC Take 1 capsule (40 mg total) by mouth daily.  Indication: Generalized Anxiety Disorder, Major Depressive Disorder   gabapentin 300 MG capsule Commonly known as: Neurontin Take 1 capsule (300 mg total) by mouth 3 (three) times daily.  Indication: Generalized Anxiety Disorder    nicotine 14 mg/24hr patch Commonly known as: NICODERM CQ - dosed in mg/24 hours Place 1 patch (14 mg total) onto the skin daily. Replaces: nicotine 21 mg/24hr patch  Indication: Nicotine Addiction   nicotine polacrilex 4 MG gum Commonly known as: Nicorette Take 1 each (4 mg total) by mouth as needed for smoking cessation.  Indication: Nicotine Addiction        Follow-up Information     [facility]. Go on 11/13/2023.   Specialty: Behavioral Health Why: You have an appointment  for medication management services on 11/13/23 at 2:00 pm, in person. Contact information: [address] [place] [address] [phone]        [facility]. Go to.   Specialty: Behavioral Health Why: You have an appointment for therapy services with Ambrose Mantle on 11/20/23 at 1:00 pm.  She can accept your new Medicaid insurance. Contact information: [address] [place] [address] [phone]               Follow-up recommendations:   Activity: as tolerated  Diet: heart healthy  Other: -Follow-up with your outpatient psychiatric provider -instructions on appointment date, time, and address (location) are provided to you in discharge paperwork.  -Take your psychiatric medications as prescribed at discharge - instructions are provided to you in the discharge paperwork  -Follow-up with outpatient primary care doctor and other specialists -for management of preventative medicine and chronic medical disease  -Testing: Follow-up with outpatient provider for abnormal lab results: LDL 104  -If you are prescribed an atypical antipsychotic medication, we recommend that your outpatient psychiatrist follow routine screening for side effects within 3 months of discharge, including monitoring: AIMS scale, height, weight, blood pressure, fasting lipid panel, HbA1c,  and fasting blood sugar.   -Recommend total abstinence from alcohol, tobacco, and other illicit drug use at discharge.   -If your psychiatric symptoms recur, worsen, or if you have side effects to your psychiatric medications, call your outpatient psychiatric provider, 911, [phone] or go to the nearest emergency department.  -If suicidal thoughts occur, immediately call your outpatient psychiatric provider, 911, [phone] or go to the nearest emergency department.   Signed: Peterson Ao, MD 11/09/2023, 8:51 AM

## 2023-11-09 NOTE — Progress Notes (Signed)
  [facility] Adult Case Management Discharge Plan :  Will you be returning to the same living situation after discharge:  Yes,  home w/ mother. At discharge, do you have transportation home?: Yes,  mother to provide at 12:30PM Do you have the ability to pay for your medications: Yes,  Medicaid    Release of information consent forms completed and in the chart;  Patient's signature needed at discharge.  Patient to Follow up at:  Follow-up Information     [facility]. Go on 11/13/2023.   Specialty: Behavioral Health Why: You have an appointment for medication management services on 11/13/23 at 2:00 pm, in person. Contact information: [address] [place] [address] [phone]        [facility]. Go to.   Specialty: Behavioral Health Why: You have an appointment for therapy services with Ambrose Mantle on 11/20/23 at 1:00 pm.  She can accept your new Medicaid insurance. Contact information: [address] [place] [address] [phone]                Next level of care provider has access to [facility] Link:yes  Safety Planning and Suicide Prevention discussed: Yes,  completed with pt's mother on 11/09/2023  Has patient been referred to the Quitline?: Yes, faxed/e-referral on 11/09/23  Patient has been referred for addiction treatment: No known substance use disorder.  Joelyn Oms Jacque Garrels, LCSW 11/09/2023, 9:24 AM

## 2023-11-09 NOTE — Progress Notes (Signed)
 Pt discharged to lobby. Pt was stable and appreciative at that time. All papers and prescriptions were given and valuables returned. Suicide safety plan completed. Copy given to pt. Verbal understanding expressed. Denies SI/HI and A/VH. Pt given opportunity to express concerns and ask questions.

## 2023-11-09 NOTE — Progress Notes (Signed)
   11/09/23 1000  Psych Admission Type (Psych Patients Only)  Admission Status Voluntary  Psychosocial Assessment  Patient Complaints None  Eye Contact Fair  Facial Expression Animated  Affect Appropriate to circumstance  Speech Logical/coherent  Interaction Assertive  Motor Activity Other (Comment) (steady gait)  Appearance/Hygiene Unremarkable  Behavior Characteristics Cooperative;Appropriate to situation  Mood Euthymic  Thought Process  Coherency WDL  Content WDL  Delusions None reported or observed  Perception WDL  Hallucination None reported or observed  Judgment WDL  Confusion WDL  Danger to Self  Current suicidal ideation? Denies  Self-Injurious Behavior No self-injurious ideation or behavior indicators observed or expressed   Danger to Others  Danger to Others None reported or observed

## 2023-11-09 NOTE — BHH Suicide Risk Assessment (Signed)
 [facility] Discharge Suicide Risk Assessment  Principal Problem: Major depressive disorder Discharge Diagnoses: Principal Problem:   Major depressive disorder Active Problems:   GAD (generalized anxiety disorder)   Reason for Admission:  Scott Gordon is a 33 y.o., male with a past psychiatric history of MDD, GAD and marijuana use who presents to the [facility] Voluntary from behavioral health urgent care [facility]) for evaluation and management of worsening depression and suicidal thoughts without a plan.   Hospital Summary  During the patient's hospitalization, patient had extensive initial psychiatric evaluation, and follow-up psychiatric evaluations every day.  Psychiatric diagnoses provided upon initial assessment:  Major Depressive Disorder  Generalized Anxiety Disorder  Marijuana Use   Patient's psychiatric medications were adjusted on admission:  - Restarted Abilify 5 mg for depression, patient discontinued due to change in insurance  - Restarted Fluoxetine 40 mg for depression, patient discontinued due to change in insurance - Continued Gabapentin 300 mg three times daily for anxiety  - Started Nicotine Patch 14 mg for nicotine dependence   During the hospitalization, other adjustments were made to the patient's psychiatric medication regimen: None   Patient's care was discussed during the interdisciplinary team meeting every day during the hospitalization.  The patient denied having side effects to prescribed psychiatric medication.  Gradually, patient started adjusting to milieu. The patient was evaluated each day by a clinical provider to ascertain response to treatment. Improvement was noted by the patient's report of decreasing symptoms, improved sleep and appetite, affect, medication tolerance, behavior, and participation in unit programming.  Patient was asked each day to complete a self inventory noting mood, mental status, pain, new symptoms, anxiety and  concerns.    Symptoms were reported as significantly decreased or resolved completely by discharge.   On day of discharge, the patient reports that their mood is stable. The patient denied having suicidal thoughts for more than 48 hours prior to discharge.  Patient denies having homicidal thoughts.  Patient denies having auditory hallucinations.  Patient denies any visual hallucinations or other symptoms of psychosis. The patient was motivated to continue taking medication with a goal of continued improvement in mental health.   The patient reports their target psychiatric symptoms of depression, anxiety and suicidal ideations responded well to the psychiatric medications, and the patient reports overall benefit other psychiatric hospitalization. Supportive psychotherapy was provided to the patient. The patient also participated in regular group therapy while hospitalized. Coping skills, problem solving as well as relaxation therapies were also part of the unit programming.  Labs were reviewed with the patient, and abnormal results were discussed with the patient.  The patient is able to verbalize their individual safety plan to this provider.  # It is recommended to the patient to continue psychiatric medications as prescribed, after discharge from the hospital.    # It is recommended to the patient to follow up with your outpatient psychiatric provider and PCP.  # It was discussed with the patient, the impact of alcohol, drugs, tobacco have been there overall psychiatric and medical wellbeing, and total abstinence from substance use was recommended the patient.ed.  # Prescriptions provided or sent directly to preferred pharmacy at discharge. Patient agreeable to plan. Given opportunity to ask questions. Appears to feel comfortable with discharge.    # In the event of worsening symptoms, the patient is instructed to call the crisis hotline, 911 and or go to the nearest ED for appropriate  evaluation and treatment of symptoms. To follow-up with primary care  provider for other medical issues, concerns and or health care needs  # Patient was discharged home with a plan to follow up as noted below.  Total Time spent with patient: 30 minutes  Musculoskeletal: Strength & Muscle Tone: within normal limits Gait & Station: normal Patient leans: N/A  Psychiatric Specialty Exam  Presentation  General Appearance: Appropriate for Environment; Casual  Eye Contact:Good  Speech:Blocked; Normal Rate  Speech Volume:Normal  Handedness:Right   Mood and Affect  Mood:Euthymic  Duration of Depression Symptoms: Greater than two weeks  Affect:Appropriate; Congruent; Full Range   Thought Process  Thought Processes:Coherent; Linear  Descriptions of Associations:Intact  Orientation:Full (Time, Place and Person)  Thought Content:Logical  History of Schizophrenia/Schizoaffective disorder:No  Duration of Psychotic Symptoms:No data recorded Hallucinations:Hallucinations: None  Ideas of Reference:None  Suicidal Thoughts:Suicidal Thoughts: No SI Passive Intent and/or Plan: Without Intent; Without Plan  Homicidal Thoughts:Homicidal Thoughts: No   Sensorium  Memory:Immediate Good; Recent Good  Judgment:Good  Insight:Good   Executive Functions  Concentration:Good  Attention Span:Good  Recall:Good  Fund of Knowledge:Good  Language:Good   Psychomotor Activity  Psychomotor Activity:Psychomotor Activity: Normal   Assets  Assets:Communication Skills; Desire for Improvement; Financial Resources/Insurance; Housing; Intimacy; Resilience; Social Support; Vocational/Educational   Sleep  Sleep:Sleep: Good Number of Hours of Sleep: 9   Physical Exam: Physical Exam Constitutional:      Appearance: Normal appearance.  Eyes:     Conjunctiva/sclera: Conjunctivae normal.  Pulmonary:     Effort: Pulmonary effort is normal.  Musculoskeletal:        General:  Normal range of motion.  Neurological:     Mental Status: He is alert and oriented to person, place, and time.    Review of Systems  Constitutional:  Negative for chills and fever.  Cardiovascular:  Negative for chest pain.  Gastrointestinal:  Negative for nausea and vomiting.  Neurological:  Negative for headaches.   Blood pressure 114/75, pulse 62, temperature 98.4 F (36.9 C), temperature source Oral, resp. rate 16, height 5\' 7"  (1.702 m), weight 73.6 kg, SpO2 100%. Body mass index is 25.4 kg/m.  Mental Status Per Nursing Assessment::   On Admission:  Suicidal ideation indicated by others  Demographic Factors:  Male, Adolescent or young adult, and Low socioeconomic status  Loss Factors: Financial problems/change in socioeconomic status  Historical Factors: Impulsivity  Risk Reduction Factors:   Employed, Living with another person, especially a relative, and Positive social support  Continued Clinical Symptoms:  Severe Anxiety and/or Agitation Depression:   Hopelessness  Cognitive Features That Contribute To Risk:  None    Suicide Risk:  Mild: There are no identifiable suicide plans, no associated intent, mild dysphoria and related symptoms, good self-control (both objective and subjective assessment), few other risk factors, and identifiable protective factors, including available and accessible social support.   Follow-up Information     [facility]. Go on 11/13/2023.   Specialty: Behavioral Health Why: You have an appointment for medication management services on 11/13/23 at 2:00 pm, in person. Contact information: [address] [place] [address] [phone]        [facility]. Go to.   Specialty: Behavioral Health Why: You have an appointment for therapy services with Ambrose Mantle on 11/20/23 at 1:00 pm.  She can accept your new Medicaid  insurance. Contact information: [address] [place] [address] [phone]  Plan Of Care/Follow-up recommendations:   Activity: as tolerated  Diet: heart healthy  Other: -Follow-up with your outpatient psychiatric provider -instructions on appointment date, time, and address (location) are provided to you in discharge paperwork.  -Take your psychiatric medications as prescribed at discharge - instructions are provided to you in the discharge paperwork  -Follow-up with outpatient primary care doctor and other specialists -for management of preventative medicine and chronic medical disease  -Testing: Follow-up with outpatient provider for abnormal lab results: LDL 104  -If you are prescribed an atypical antipsychotic medication, we recommend that your outpatient psychiatrist follow routine screening for side effects within 3 months of discharge, including monitoring: AIMS scale, height, weight, blood pressure, fasting lipid panel, HbA1c, and fasting blood sugar.   -Recommend total abstinence from alcohol, tobacco, and other illicit drug use at discharge.   -If your psychiatric symptoms recur, worsen, or if you have side effects to your psychiatric medications, call your outpatient psychiatric provider, 911, [phone] or go to the nearest emergency department.  -If suicidal thoughts occur, immediately call your outpatient psychiatric provider, 911, [phone] or go to the nearest emergency department.   Signed: Peterson Ao, MD 11/09/2023, 8:51 AM

## 2023-11-09 NOTE — Plan of Care (Signed)
   Problem: Education: Goal: Knowledge of [place] [facility] Education information/materials will improve Outcome: Progressing Goal: Emotional status will improve Outcome: Progressing

## 2023-11-09 NOTE — Progress Notes (Signed)
   11/08/23 2030  Psych Admission Type (Psych Patients Only)  Admission Status Voluntary  Psychosocial Assessment  Patient Complaints None  Eye Contact Fair  Facial Expression Other (Comment) (bright and congruent with thoughts)  Affect Appropriate to circumstance;Other (Comment) (Content)  Speech Logical/coherent  Interaction Assertive  Appearance/Hygiene Unremarkable  Behavior Characteristics Appropriate to situation;Cooperative;Calm  Mood Pleasant;Euthymic (Pt was bright in affect and pleasant on approach. 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 Pt denied SI/HI/AVH and any pain, anxiety or depression.)  Thought Process  Coherency WDL  Content WDL  Delusions None reported or observed  Perception WDL  Hallucination None reported or observed  Judgment WDL  Confusion WDL  Danger to Self  Current suicidal ideation? Denies  Self-Injurious Behavior No self-injurious ideation or behavior indicators observed or expressed   Agreement Not to Harm Self Yes  Description of Agreement Verbal  Danger to Others  Danger to Others None reported or observed

## 2023-11-11 ENCOUNTER — Emergency Department (HOSPITAL_COMMUNITY)
Admission: EM | Admit: 2023-11-11 | Discharge: 2023-11-11 | Disposition: A | Discharge status: Home / Self Care | Payer: MEDICAID | Attending: Emergency Medicine | Admitting: Emergency Medicine

## 2023-11-11 DIAGNOSIS — F17210 Nicotine dependence, cigarettes, uncomplicated: Secondary | ICD-10-CM | POA: Insufficient documentation

## 2023-11-11 DIAGNOSIS — R319 Hematuria, unspecified: Secondary | ICD-10-CM | POA: Insufficient documentation

## 2023-11-11 LAB — URINALYSIS, ROUTINE W REFLEX MICROSCOPIC
Bacteria, UA: NONE SEEN
Bilirubin Urine: NEGATIVE
Glucose, UA: NEGATIVE mg/dL
Ketones, ur: NEGATIVE mg/dL
Leukocytes,Ua: NEGATIVE
Nitrite: NEGATIVE
Protein, ur: NEGATIVE mg/dL
RBC / HPF: >50 RBC/hpf (ref 0–5)
Specific Gravity, Urine: 1.023 (ref 1.005–1.030)
pH: 6 (ref 5.0–8.0)

## 2023-11-11 NOTE — Discharge Instructions (Addendum)
 As we discussed, your workup in the ER today was reassuring for acute findings.  Your urine did show some signs of diagnosis plantarflexion.  You don't have any signs or symptoms to suggest a kidney stone. Your symptoms are improving without intervention.   Given these above reasons, no further evaluation is indicated at this time.  You do however need to see urology for follow-up as well as a pcp. If you do not have a pcp, then I have given you a referral to the wellness center where you can follow-up.   Please refer to the attached instructions for further information regarding your symptoms.  Return if development of any new or worsening symptoms.

## 2023-11-11 NOTE — ED Provider Notes (Signed)
 [place] EMERGENCY DEPARTMENT AT [facility] Provider Note   CSN: [id number] Arrival date & time: 11/11/23  4098     History  Chief Complaint  Patient presents with   Hematuria    Scott Gordon is a 33 y.o. male.  Patient with history of tobacco abuse presents today with complaints of hematuria. He states that this morning when he went to urinate he initially was unable to and then had significant discomfort and was finally able to and states thick dark red blood came out into the toilet. No history of same. Did urinate here with no discomfort and small amount of blood noted which he states is "night and day" compared to this morning. He is asymptomatic at this time. Denies fevers or chills. No penile discharge or concern for STDs. No abdominal pain, flank pain, nausea, vomiting, or diarrhea. Does smoke cigarettes and vapes daily.   The history is provided by the patient. No language interpreter was used.  Hematuria       Home Medications Prior to Admission medications   Medication Sig Start Date End Date Taking? Authorizing Provider  ARIPiprazole (ABILIFY) 5 MG tablet Take 1 tablet (5 mg total) by mouth daily. 11/09/23   Peterson Ao, MD  FLUoxetine (PROZAC) 40 MG capsule Take 1 capsule (40 mg total) by mouth daily. 11/09/23   Peterson Ao, MD  gabapentin (NEURONTIN) 300 MG capsule Take 1 capsule (300 mg total) by mouth 3 (three) times daily. 08/21/23   Park Pope, MD  nicotine (NICODERM CQ - DOSED IN MG/24 HOURS) 14 mg/24hr patch Place 1 patch (14 mg total) onto the skin daily. 11/09/23   Peterson Ao, MD  nicotine polacrilex (NICORETTE) 4 MG gum Take 1 each (4 mg total) by mouth as needed for smoking cessation. 11/09/23   Peterson Ao, MD      Allergies    Pineapple    Review of Systems   Review of Systems  Genitourinary:  Positive for hematuria.  All other systems reviewed and are negative.   Physical Exam Updated Vital Signs BP 122/73 (BP  Location: Right Arm)   Pulse 78   Temp 98.1 F (36.7 C) (Oral)   Resp 18   Ht 5\' 7"  (1.702 m) Comment: Simultaneous filing. User may not have seen previous data.  Wt 72.6 kg Comment: Simultaneous filing. User may not have seen previous data.  SpO2 100%   BMI 25.06 kg/m  Physical Exam Vitals and nursing note reviewed.  Constitutional:      General: He is not in acute distress.    Appearance: Normal appearance. He is normal weight. He is not ill-appearing, toxic-appearing or diaphoretic.  HENT:     Head: Normocephalic and atraumatic.  Cardiovascular:     Rate and Rhythm: Normal rate.  Pulmonary:     Effort: Pulmonary effort is normal. No respiratory distress.  Abdominal:     General: Abdomen is flat.     Palpations: Abdomen is soft.     Tenderness: There is no abdominal tenderness.  Musculoskeletal:        General: Normal range of motion.     Cervical back: Normal range of motion.  Skin:    General: Skin is warm and dry.  Neurological:     General: No focal deficit present.     Mental Status: He is alert.  Psychiatric:        Mood and Affect: Mood normal.        Behavior: Behavior normal.  ED Results / Procedures / Treatments   Labs (all labs ordered are listed, but only abnormal results are displayed) Labs Reviewed  URINALYSIS, ROUTINE W REFLEX MICROSCOPIC - Abnormal; Notable for the following components:      Result Value   APPearance HAZY (*)    Hgb urine dipstick LARGE (*)    All other components within normal limits    EKG None  Radiology No results found.  Procedures Procedures    Medications Ordered in ED Medications - No data to display  ED Course/ Medical Decision Making/ A&P                                 Medical Decision Making Amount and/or Complexity of Data Reviewed Labs: ordered.   This patient is a 33 y.o. male  who presents to the ED for concern of hematuria.   Differential diagnoses prior to evaluation: The emergent  differential diagnosis includes, but is not limited to,  neoplasm, trauma from intercourse, UTI, kidney stone. This is not an exhaustive differential.   Past Medical History / Co-morbidities / Social History:  has a past medical history of ADHD, predominantly inattentive type, Anxiety, Depression, and Nicotine dependence.  Additional history: Chart reviewed.  Physical Exam: Physical exam performed. The pertinent findings include: well appearing, no abdominal tenderness.   Lab Tests: UA with large hgb, >50 RBCs, 6-10 WBCs, no leukocytes, no bacteria   Disposition: After consideration of the diagnostic results and the patients response to treatment, I feel that emergency department workup does not suggest an emergent condition requiring admission or immediate intervention beyond what has been performed at this time. The plan is: discharge with urology follow-up, pcp follow-up, close return precautions. He is asymptomatic and his hematuria is improving. His UA is noninfectious. He does not want STD testing. No symptoms to suggest kidney stone. Therefore no indication for further evaluation with labs or imaging. Evaluation and diagnostic testing in the emergency department does not suggest an emergent condition requiring admission or immediate intervention beyond what has been performed at this time.  Plan for discharge with close PCP follow-up.  Patient is understanding and amenable with plan, educated on red flag symptoms that would prompt immediate return.  Patient discharged in stable condition.  Final Clinical Impression(s) / ED Diagnoses Final diagnoses:  Hematuria, unspecified type    Rx / DC Orders ED Discharge Orders     None     An After Visit Summary was printed and given to the patient.     Vear Clock 11/11/23 4098    Wynetta Fines, MD 11/11/23 [phone]

## 2023-11-11 NOTE — ED Triage Notes (Addendum)
 Patient c/o blood in urine starting this morning Burning sensation with urine  Dark red blood per patient  Patient says he attempted to urinate 3x but it hurt very bad and there was thick blood in urine Denies any flank pain or abd Does report groin pain Rated 3/10 Denies injury  Patient reports buying two different male enhancement pills last night at gas station; took them at 8pm. Was able to urinate fine after taking. Patient has taken same pills prior with no adverse effect,.

## 2023-11-13 ENCOUNTER — Ambulatory Visit (HOSPITAL_COMMUNITY): Admitting: Student

## 2023-11-13 ENCOUNTER — Telehealth: Payer: Self-pay | Admitting: Urology

## 2023-11-13 NOTE — Telephone Encounter (Signed)
 Scheduled patient for 11/16/23 with [facility] ER visit I my chart.

## 2023-11-16 ENCOUNTER — Encounter: Payer: Self-pay | Admitting: Urology

## 2023-11-16 ENCOUNTER — Ambulatory Visit (INDEPENDENT_AMBULATORY_CARE_PROVIDER_SITE_OTHER): Payer: MEDICAID | Admitting: Urology

## 2023-11-16 VITALS — BP 117/71 | HR 71 | Ht 67.0 in | Wt 160.0 lb

## 2023-11-16 DIAGNOSIS — Z87898 Personal history of other specified conditions: Secondary | ICD-10-CM | POA: Diagnosis not present

## 2023-11-16 DIAGNOSIS — Z09 Encounter for follow-up examination after completed treatment for conditions other than malignant neoplasm: Secondary | ICD-10-CM

## 2023-11-16 DIAGNOSIS — R310 Gross hematuria: Secondary | ICD-10-CM | POA: Insufficient documentation

## 2023-11-16 LAB — URINALYSIS, ROUTINE W REFLEX MICROSCOPIC
Bilirubin, UA: NEGATIVE
Glucose, UA: NEGATIVE
Ketones, UA: NEGATIVE
Leukocytes,UA: NEGATIVE
Nitrite, UA: NEGATIVE
Protein,UA: NEGATIVE
RBC, UA: NEGATIVE
Specific Gravity, UA: 1.02 (ref 1.005–1.030)
Urobilinogen, Ur: 0.2 mg/dL (ref 0.2–1.0)
pH, UA: 7.5 (ref 5.0–7.5)

## 2023-11-16 NOTE — Progress Notes (Signed)
 Assessment: 1. Gross hematuria     Plan: I personally reviewed the patient's chart including provider notes, and lab results. Today I had a discussion with the patient regarding the findings of gross hematuria including the implications and differential diagnoses associated with it.  I also discussed recommendations for further evaluation including the rationale for upper tract imaging and cystoscopy.  I discussed the nature of these procedures including potential risk and complications.  The patient expressed an understanding of these issues. CT hematuria protocol ordered.   Will contact him with results.   Chief Complaint:  Chief Complaint  Patient presents with   Hematuria    History of Present Illness:  Scott Gordon is a 33 y.o. male who is seen for evaluation of gross hematuria. He presented to ED on 11/11/23 with gross hematuria.   He had acute onset of gross hematuria with painful urination on the morning of 11/11/2023.  He reports normal urination prior to going to sleep that night.  He had gross hematuria which lasted for 24 hours.  No associated flank or back pain. U/A in the ED showed >50 RBCs, 6-10 WBCs, no bacteria.  No additional testing or imaging was performed. He has not had any further gross hematuria.  No history of kidney stones or UTIs.  No history of trauma. He does have a history of tobacco use smoking 1 pack/week.   Past Medical History:  Past Medical History:  Diagnosis Date   ADHD, predominantly inattentive type    Anxiety    Depression    Nicotine dependence     Past Surgical History:  Past Surgical History:  Procedure Laterality Date   WISDOM TOOTH EXTRACTION      Allergies:  Allergies  Allergen Reactions   Pineapple Hives    Family History:  Family History  Problem Relation Age of Onset   Diabetes Mother    Non-Hodgkin's lymphoma Mother    Hypertension Father    Diabetes Father    Bone cancer Paternal Aunt     Social  History:  Social History   Tobacco Use   Smoking status: Every Day    Current packs/day: 0.25    Average packs/day: 0.3 packs/day for 3.0 years (0.8 ttl pk-yrs)    Types: Cigarettes   Smokeless tobacco: Never   Tobacco comments:    Previously used vapes, switched to cigarettes about 3 years ago due to cost.  Vaping Use   Vaping status: Former   Substances: Nicotine, Flavoring  Substance Use Topics   Alcohol use: Yes    Comment: social alcohol use   Drug use: Yes    Types: Marijuana    Review of symptoms:  Constitutional:  Negative for unexplained weight loss, night sweats, fever, chills ENT:  Negative for nose bleeds, sinus pain, painful swallowing CV:  Negative for chest pain, shortness of breath, exercise intolerance, palpitations, loss of consciousness Resp:  Negative for cough, wheezing, shortness of breath GI:  Negative for nausea, vomiting, diarrhea, bloody stools GU:  Positives noted in HPI; otherwise negative for dysuria, urinary incontinence Neuro:  Negative for seizures, poor balance, limb weakness, slurred speech Psych:  Negative for lack of energy, depression, anxiety Endocrine:  Negative for polydipsia, polyuria, symptoms of hypoglycemia (dizziness, hunger, sweating) Hematologic:  Negative for anemia, purpura, petechia, prolonged or excessive bleeding, use of anticoagulants  Allergic:  Negative for difficulty breathing or choking as a result of exposure to anything; no shellfish allergy; no allergic response (rash/itch) to materials, foods  Physical exam: BP 117/71   Pulse 71   Ht 5\' 7"  (1.702 m)   Wt 160 lb (72.6 kg)   BMI 25.06 kg/m  GENERAL APPEARANCE:  Well appearing, well developed, well nourished, NAD HEENT: Atraumatic, Normocephalic, oropharynx clear. NECK: Supple without lymphadenopathy or thyromegaly. LUNGS: Clear to auscultation bilaterally. HEART: Regular Rate and Rhythm without murmurs, gallops, or rubs. ABDOMEN: Soft, non-tender, No  Masses. EXTREMITIES: Moves all extremities well.  Without clubbing, cyanosis, or edema. NEUROLOGIC:  Alert and oriented x 3, normal gait, CN II-XII grossly intact.  MENTAL STATUS:  Appropriate. BACK:  Non-tender to palpation.  No CVAT SKIN:  Warm, dry and intact.    Results: Results for orders placed or performed in visit on 11/16/23 (from the past 24 hours)  Urinalysis, Routine w reflex microscopic     Status: None   Collection Time: 11/16/23 12:00 AM  Result Value Ref Range   Specific Gravity, UA 1.020 1.005 - 1.030   pH, UA 7.5 5.0 - 7.5   Color, UA Yellow Yellow   Appearance Ur Clear Clear   Leukocytes,UA Negative Negative   Protein,UA Negative Negative/Trace   Glucose, UA Negative Negative   Ketones, UA Negative Negative   RBC, UA Negative Negative   Bilirubin, UA Negative Negative   Urobilinogen, Ur 0.2 0.2 - 1.0 mg/dL   Nitrite, UA Negative Negative   Microscopic Examination Comment    Narrative   Performed at:  [facility]  Urology [facility] [address], [place], [place]  [id number] Lab Director: Corinna Lines MT, Phone:  [phone]

## 2023-11-20 ENCOUNTER — Encounter (HOSPITAL_COMMUNITY): Payer: Self-pay | Admitting: Clinical

## 2023-11-20 ENCOUNTER — Ambulatory Visit (INDEPENDENT_AMBULATORY_CARE_PROVIDER_SITE_OTHER): Payer: MEDICAID | Admitting: Clinical

## 2023-11-20 DIAGNOSIS — F411 Generalized anxiety disorder: Secondary | ICD-10-CM

## 2023-11-20 DIAGNOSIS — F1220 Cannabis dependence, uncomplicated: Secondary | ICD-10-CM

## 2023-11-20 DIAGNOSIS — F331 Major depressive disorder, recurrent, moderate: Secondary | ICD-10-CM | POA: Diagnosis not present

## 2023-11-20 NOTE — Progress Notes (Unsigned)
   THERAPIST PROGRESS NOTE  Session Time: ***  Session #2  Participation Level: {BHH PARTICIPATION LEVEL:22264}  Behavioral Response: {Appearance:22683}{BHH LEVEL OF CONSCIOUSNESS:22305}{BHH MOOD:22306}  Type of Therapy: {CHL AMB BH Type of Therapy:21022741}  Treatment Goals addressed: ***  ProgressTowards Goals: {Progress Towards Goals:21014066}  Interventions: {CHL AMB BH Type of Intervention:21022753}  Summary: Scott Gordon is a 33 y.o. male who presents with ***.   Suicidal/Homicidal: {BHH YES OR NO:22294}{yes/no/with/without intent/plan:22693}  Therapist Response: ***  Plan: Return again in *** weeks.  Diagnosis:  Major depressive disorder, recurrent episode, moderate (HCC)  GAD (generalized anxiety disorder)  Cannabis use disorder, moderate, dependence (HCC)  Collaboration of Care: {BH OP Collaboration of Care:21014065}  Patient/Guardian was advised Release of Information must be obtained prior to any record release in order to collaborate their care with an outside provider. Patient/Guardian was advised if they have not already done so to contact the registration department to sign all necessary forms in order for [place] to release information regarding their care.   Consent: Patient/Guardian gives verbal consent for treatment and assignment of benefits for services provided during this visit. Patient/Guardian expressed understanding and agreed to proceed.   Lynnell Chad, LCSW 11/20/2023

## 2023-11-20 NOTE — Progress Notes (Signed)
 Discharge diagnosis is major depressive disorder recurrent, severe, without psychotic features

## 2023-11-24 ENCOUNTER — Ambulatory Visit (HOSPITAL_COMMUNITY)
Admission: RE | Admit: 2023-11-24 | Discharge: 2023-11-24 | Disposition: A | Discharge status: Home / Self Care | Payer: MEDICAID | Source: Ambulatory Visit | Attending: Urology | Admitting: Urology

## 2023-11-24 DIAGNOSIS — R310 Gross hematuria: Secondary | ICD-10-CM | POA: Diagnosis present

## 2023-11-24 MED ORDER — IOHEXOL 300 MG/ML  SOLN
100.0000 mL | Freq: Once | INTRAMUSCULAR | Status: AC | PRN
  Administered 2023-11-24: 100 mL via INTRAVENOUS

## 2023-12-04 ENCOUNTER — Ambulatory Visit (INDEPENDENT_AMBULATORY_CARE_PROVIDER_SITE_OTHER): Payer: MEDICAID | Admitting: Clinical

## 2023-12-04 ENCOUNTER — Telehealth (HOSPITAL_COMMUNITY): Payer: Self-pay

## 2023-12-04 DIAGNOSIS — F331 Major depressive disorder, recurrent, moderate: Secondary | ICD-10-CM

## 2023-12-04 DIAGNOSIS — F411 Generalized anxiety disorder: Secondary | ICD-10-CM

## 2023-12-04 DIAGNOSIS — F1220 Cannabis dependence, uncomplicated: Secondary | ICD-10-CM | POA: Diagnosis not present

## 2023-12-04 MED ORDER — FLUOXETINE HCL 40 MG PO CAPS: 40.0000 mg | ORAL_CAPSULE | Freq: Every day | ORAL | 0 refills | Status: DC

## 2023-12-04 NOTE — Progress Notes (Signed)
 THERAPIST PROGRESS NOTE  Session Time: 1:03pm-2:00pm  Session #3  Participation Level: Active  Behavioral Response: Casual and Neat Alert Depressed  Type of Therapy: Individual Therapy  Treatment Goals addressed:  STG: Scott Gordon will identify and decrease cognitive distortions contributing negatively to mood and behavior by identifying 5-7 cognitive distortions and learning how to come up with replacement thoughts that are more balanced, realistic, and helpful.   STG: Score less than 9 on the Patient Health Questionnaire (PHQ-9) as evidenced by intermittent administration of the questionnaire to determine progress.   STG: Scott Gordon will explore personal core beliefs, rules and assumptions, and cognitive distortions through therapist using Cognitive Behavioral Therapy.   LTG:  Provide psychoeducation on communication techniques such as "I" statements, open-ended questions, reflective listening, assertiveness, fair fighting rules, initiating conversations, and more as needed. STG: Scott Gordon will process life events to the extent needed so that he is able to move forward with various areas of life in a better frame of mind per self-report    LTG: Scott Gordon will improve quality of life by reducing consumption of mood-altering substances LTG: Scott Gordon will increase coping skills to promote long-term recovery and improve ability to perform daily activities STG: Scott Gordon will attend recovery-focused peer-led support group.  STG: Work to develop a marijuana cessation plan and timeline, including through use of a Building control surveyor Exercise and Motivational Interviewing.   LTG: Score less than 5 on the GAD-7 as evidenced by intermittent administration of the questionnaire to determine progress in management of anxiety.    STG: Scott Gordon will reduce frequency of avoidant behaviors by 50% as evidenced by self-report in therapy sessions  LTG: Work to Arts development officer from models like CBT, Stages of  Change, DBT, shame resilience theory, ACT, SFBT, MI, trauma-informed therapy and others to be able to manage mental health symptoms, AEB practicing out of session and reporting back.  STG: Learn about boundary types, how to implement them, and how to enforce them so that Brain feels more empowered and content with being able to maintain more helpful, appropriate boundaries in the future for a more balanced result.  LTG: Learn about elements of a Relapse Prevention Plan and devise Scott Gordon's own plan covering basic elements of distractions, supports, consequences, and exit strategies.    LTG: Learn breathing techniques and grounding techniques at an age-appropriate level and demonstrate mastery in session then report independent use of these skills out of session.    ProgressTowards Goals: Progressing  Interventions: CBT, Motivational Interviewing, and Supportive  Summary: Scott Gordon is a 33 y.o. male who presents with diagnoses of Persistent Depressive Disorder, GAD, history of ADHD that is being evaluated currently, nicotine use disorder, and cannabis use disorder.   He presented oriented x5 and stated he was feeling "okay, talked about depression sometimes being worse because he is afraid to have an idle mind, is afraid that this will bring back severe depression or suicidal thoughts."  CSW evaluated patient's medication compliance, use of coping tools, and self-care, as applicable.  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  Throughout the session, CSW  explored his core beliefs  that included "I am lost, confused, and judged.  I don't know what I'm doing, other people are frightening, think they're better than me, are judgmental, and the world is unforgiving."  These beliefs were discussed and it was suggested to him that part of the reason he is so afraid of action is that he is afraid if he does something wrong, the damage will be permanent.  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  Motivational Interviewing was used to determine how much he wants to get the certification, how confident he is that he can get it, and how motivated he is to study for it.  The scale of 1 (minimal) to 10 (maximum) was used.  He wants the certification at a 7, stating that he knows there is a better chance of job promotion if he gets it.  His confidence that he can get the certification is a 10.  His motivation to study is a 4, and CSW facilitated him examining why it is a 4 and not a 0.  Finally, we talked about encouraging behaviors by pairing "good" things together and discouraging behavior by pairing incompatible things together.  This came about because he stated he tries to listen to podcasts and play a game at the same time, without success.  CSW explained how the games he plays are hijacking his interest.  CSW suggested that he institute a rule that he can play his game if he studies for an hour.  CSW acknowledged the depth of his fears and frustrations and gave him space to discuss them.  Suicidal/Homicidal: No without intent/plan  Therapist Response: Patient is progressing AEB engaging in scheduled therapy session.  Throughout the session, CSW gave patient the opportunity to explore thoughts and feelings associated with current life situations and  past/present stressors.   CSW challenged patient gently and appropriately to consider different ways of looking at reported issues. CSW encouraged patient's expression of feelings and validated these using empathy, active listening, open body language, and unconditional positive regard.   CSW encouraged patient to schedule more therapy sessions for the future, as needed.  Recommendations:  Return to therapy in 2 weeks, engage in self care behaviors as explored in session, do homework as assigned (try pairing study with a reward of game time ), and return to next session prepared to talk about experience with new coping methods.   Plan: Return again in 2 weeks on 4/14  Diagnosis:  Major depressive disorder, recurrent episode, moderate (HCC)  GAD (generalized anxiety disorder)  Cannabis use disorder, moderate, dependence (HCC)  Collaboration of Care: Psychiatrist AEB - psychiatrist can read therapy notes; therapist can and does read psychiatric notes prior to sessions   Patient/Guardian was advised Release of Information must be obtained prior to any record release in order to collaborate their care with an outside provider. Patient/Guardian was advised if they have not already done so to contact the registration department to sign all necessary forms in order for [place] to release information regarding their care.   Consent: Patient/Guardian gives verbal consent for treatment and assignment of benefits for services provided during this visit. Patient/Guardian expressed understanding and agreed to proceed.   Lynnell Chad, LCSW 12/04/2023

## 2023-12-04 NOTE — Addendum Note (Signed)
 Addended by: Park Pope B on: 12/04/2023 09:59 AM   Modules accepted: Orders

## 2023-12-04 NOTE — Telephone Encounter (Signed)
 Medication sent to preferred pharmacy

## 2023-12-04 NOTE — Telephone Encounter (Signed)
 Pt is requesting for a refill of 90.0 capsules of FLUOXETINE to be sent in to pharmacy.

## 2023-12-04 NOTE — Progress Notes (Addendum)
 BH MD Outpatient Progress Note  12/05/2023 1:19 PM Scott Gordon  MRN:  [id number]  Assessment:  [name] presents for follow-up evaluation in-person. Patient diagnosis most consistent with persistent depressive disorder, cannabis use disorder, and nicotine use disorder.   Patient was last seen by me 08/21/23.  He has since been psychiatrically hospitalized after running out of medication and having worsening depression.  Medications have largely been unchanged with the exception of increase fluoxetine to 40 mg daily.  Patient overall reports in depression and anxiety are well-controlled but states that symptoms of inattention remain a problem.  He does continue to use cannabis which may be contributing to his inattention.  DIVA assessment today was inconclusive regarding childhood symptoms of ADHD but inattention symptoms seem prominent in regards to patient's adulthood.  Patient was not amenable to starting viloxazine as well as allowing for me to talk with mom regarding his symptoms as a child. He was amenable to stopping all cannabis use as well as obtaining urine drug screen to verify this.   Plan: # Persistent Depressive Disorder Past medication trials:  wellbutrin (migraine, memory loss), fluoxetine (ineffective), duloxetine (overflow incontinence), venlafaxine (ineffective) Status of problem: stable Interventions: --Continue fluoxetine 40 mg daily --Continue Abilify 5 mg daily   # Generalized Anxiety Disorder Past medication trials: hydroxyzine (ineffective) Status of problem: stable Interventions: -- Continue gabapentin 300 mg TID for anxiety symptoms -- Fluoxetine as above   # Nicotine Use Disorder -Continue nicotine patch 21 mg daily  -Continue nicotine gum 4 mg PRN   #Cannabis Use Disorder -Advise cessation   #History of ADHD Unclear if meets criteria due to anxiety, depression, and cannabis use Past medication trials: Adderall (urinary retention),  strattera (urinary retention) Status of problem: Stable Interventions -Continue to monitor symptoms  Patient was given contact information for behavioral health clinic and was instructed to call 911 for emergencies.   Subjective:  Chief Complaint:  No chief complaint on file.   Interval History:  Patient reports that his depression and anxiety remain stable.  Denies SI/HI/AVH.  Reports eating and sleeping fairly well.  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  He continues to work at [employer].  He continues to smoke cannabis approximately twice a week by vaping.  He states that this is recreationally and is willing to discontinue should it better aid with his attention as well as clarify ADHD diagnosis.  He was amenable to continuing medications as prescribed at this time.  He was amenable to starting viloxazine to assess for it helping with his focus.  Visit Diagnosis:    ICD-10-CM   1. Cannabis use disorder, moderate, dependence (HCC)  F12.20 [id number] 11+Oxyco+Alc+Crt-Bund    2. History of ADHD  Z86.59 viloxazine ER (QELBREE) 200 MG 24 hr capsule    3. MDD (major depressive disorder), recurrent episode, moderate (HCC)  F33.1 ARIPiprazole (ABILIFY) 5 MG tablet    4. Major depressive disorder, recurrent episode, moderate (HCC)  F33.1 FLUoxetine (PROZAC) 40 MG capsule    5. GAD (generalized anxiety disorder)  F41.1 gabapentin (NEURONTIN) 300 MG capsule        Past Medical History:  Past Medical History:  Diagnosis Date   ADHD, predominantly inattentive type    Anxiety    Depression    Nicotine dependence     Past Surgical History:  Procedure Laterality Date   WISDOM TOOTH EXTRACTION  Family History:  Family History  Problem Relation Age of Onset   Diabetes Mother    Non-Hodgkin's lymphoma Mother    Hypertension Father    Diabetes Father    Bone  cancer Paternal Aunt     Social History:  Social History   Socioeconomic History   Marital status: Significant Other    Spouse name: Not on file   Number of children: Not on file   Years of education: Not on file   Highest education level: Not on file  Occupational History   Not on file  Tobacco Use   Smoking status: Every Day    Current packs/day: 0.25    Average packs/day: 0.3 packs/day for 3.0 years (0.8 ttl pk-yrs)    Types: Cigarettes   Smokeless tobacco: Never   Tobacco comments:    Previously used vapes, switched to cigarettes about 3 years ago due to cost.  Vaping Use   Vaping status: Former   Substances: Nicotine, Flavoring  Substance and Sexual Activity   Alcohol use: Yes    Comment: social alcohol use   Drug use: Yes    Types: Marijuana   Sexual activity: Yes    Birth control/protection: Condom  Other Topics Concern   Not on file  Social History Narrative   Lives with mother and sister. He is polyamorous and has 2 significant others. Currently unemployed, in school for IT. Independent in ADLs and IADLs.    Social Drivers of Corporate investment banker Strain: Low Risk  (04/06/2022)   Overall Financial Resource Strain (CARDIA)    Difficulty of Paying Living Expenses: Not hard at all  Food Insecurity: No Food Insecurity (11/05/2023)   Hunger Vital Sign    Worried About Running Out of Food in the Last Year: Never true    Ran Out of Food in the Last Year: Never true  Transportation Needs: No Transportation Needs (11/05/2023)   PRAPARE - Administrator, Civil Service (Medical): No    Lack of Transportation (Non-Medical): No  Physical Activity: Sufficiently Active (04/06/2022)   Exercise Vital Sign    Days of Exercise per Week: 3 days    Minutes of Exercise per Session: 90 min  Stress: Stress Concern Present (04/06/2022)   [employer] of Occupational Health - Occupational Stress Questionnaire    Feeling of Stress : Very much  Social Connections:  Socially Isolated (11/05/2023)   Social Connection and Isolation Panel [NHANES]    Frequency of Communication with Friends and Family: Never    Frequency of Social Gatherings with Friends and Family: More than three times a week    Attends Religious Services: Never    Database administrator or Organizations: No    Attends Banker Meetings: Never    Marital Status: Never married    Allergies:  Allergies  Allergen Reactions   Pineapple Hives    Current Medications: Current Outpatient Medications  Medication Sig Dispense Refill   viloxazine ER (QELBREE) 200 MG 24 hr capsule Take 1 capsule (200 mg total) by mouth daily. 30 capsule 1   ARIPiprazole (ABILIFY) 5 MG tablet Take 1 tablet (5 mg total) by mouth daily. 90 tablet 0   FLUoxetine (PROZAC) 40 MG capsule Take 1 capsule (40 mg total) by mouth daily. 90 capsule 0   gabapentin (NEURONTIN) 300 MG capsule Take 1 capsule (300 mg total) by mouth 3 (three) times daily. 90 capsule 1   nicotine (NICODERM CQ - DOSED IN MG/24 HOURS)  14 mg/24hr patch Place 1 patch (14 mg total) onto the skin daily. 28 patch 0   nicotine polacrilex (NICORETTE) 4 MG gum Take 1 each (4 mg total) by mouth as needed for smoking cessation. 100 tablet 0   No current facility-administered medications for this visit.    ROS: Review of Systems  Objective:  Psychiatric Specialty Exam: There were no vitals taken for this visit.There is no height or weight on file to calculate BMI.  General Appearance: Fairly Groomed  Eye Contact:  Good  Speech:  Clear and Coherent and Normal Rate  Volume:  Normal  Mood:  Euthymic  Affect:  Appropriate and Congruent  Thought Process:  Coherent, Goal Directed, and Linear  Orientation:  Full (Time, Place, and Person)  Thought Content: Logical   Suicidal Thoughts:  No  Homicidal Thoughts:  No  Memory:  Remote;   Good  Judgment:  Fair  Insight:  Fair  Psychomotor Activity:  Normal  Concentration:  Concentration: Good  and Attention Span: Good              Assets:  Communication Skills Desire for Improvement Financial Resources/Insurance Housing Intimacy Leisure Time Physical Health Resilience Social Support Talents/Skills Transportation Vocational/Educational  ADL's:  Intact  Cognition: WNL  Sleep:  Good   PE: General: well-appearing; no acute distress  Pulm: no increased work of breathing on room air  Strength & Muscle Tone: within normal limits Neuro: no focal neurological deficits observed  Gait & Station: normal  Metabolic Disorder Labs: Lab Results  Component Value Date   HGBA1C 5.2 11/05/2023   MPG 102.54 11/05/2023   MPG 108 09/28/2021   No results found for: "PROLACTIN" Lab Results  Component Value Date   CHOL 181 11/05/2023   TRIG 69 11/05/2023   HDL 63 11/05/2023   CHOLHDL 2.9 11/05/2023   VLDL 14 11/05/2023   LDLCALC 104 (H) 11/05/2023   LDLCALC 111 (H) 12/19/2022   Lab Results  Component Value Date   TSH 0.473 11/05/2023   TSH 0.658 05/24/2023    Therapeutic Level Labs: No results found for: "LITHIUM" No results found for: "VALPROATE" No results found for: "CBMZ"  Screenings: AUDIT    Flowsheet Row [facility] Counselor from [facility]03/2020 in [facility]  Alcohol Use Disorder Identification Test Final Score (AUDIT) 3 4      GAD-7    Flowsheet Row Counselor from 08/14/2023 in [facility] Visit from 12/19/2022 in [facility] [place] Counselor from 11/23/2022 in [facility] Counselor from 09/07/2022 in [facility] Counselor from 06/08/2022 in [facility]  Total GAD-7 Score 10 10 13 8 6       PHQ2-9    Flowsheet Row Counselor from 08/14/2023 in [facility] at [facility] Visit from 12/19/2022 in [facility] Of [place] Counselor from 11/23/2022 in [facility] Clinical Support from 10/27/2022 in [facility] Counselor from 09/07/2022 in [facility]  PHQ-2 Total Score 5 2 4 3 2   PHQ-9 Total Score 18 12 19 12 7       Flowsheet Row ED from 11/11/2023 in [facility] at [facility] Most recent reading at 11/11/2023  8:22 AM Ad[facility] (Discharged[facility] Most recent reading at 11/05/2023  6:27 PM ED from 11/05/2023 in [facility] Most recent reading at 11/05/2023  2:14 PM  C-SSRS RISK CATEGORY No Risk Low Risk Low Risk       Collaboration of Care: Collaboration of Care:   Patient/Guardian was advised Release of Information must be obtained prior to any record release in order to collaborate their care with an outside provider. Patient/Guardian was advised if they have not already done so to contact the registration department to sign all necessary forms in order for [place] to release information regarding their care.   Consent: Patient/Guardian gives verbal consent for treatment and assignment of benefits for services provided during this visit. Patient/Guardian expressed understanding and agreed to proceed.   A total of 35 minutes was spent involved in face to face clinical care, chart review, and documentation.   Park Pope, MD 12/05/2023, 1:19 PM

## 2023-12-05 ENCOUNTER — Ambulatory Visit (INDEPENDENT_AMBULATORY_CARE_PROVIDER_SITE_OTHER): Payer: MEDICAID | Admitting: Student

## 2023-12-05 ENCOUNTER — Encounter: Payer: Self-pay | Admitting: Urology

## 2023-12-05 DIAGNOSIS — Z8659 Personal history of other mental and behavioral disorders: Secondary | ICD-10-CM

## 2023-12-05 DIAGNOSIS — F1220 Cannabis dependence, uncomplicated: Secondary | ICD-10-CM | POA: Diagnosis not present

## 2023-12-05 DIAGNOSIS — F411 Generalized anxiety disorder: Secondary | ICD-10-CM

## 2023-12-05 DIAGNOSIS — F331 Major depressive disorder, recurrent, moderate: Secondary | ICD-10-CM

## 2023-12-05 MED ORDER — VILOXAZINE HCL ER 200 MG PO CP24
200.0000 mg | ORAL_CAPSULE | Freq: Every day | ORAL | 1 refills | Status: AC
  Filled 2023-12-13 – 2024-01-01 (×2): qty 30, 30d supply, fill #0

## 2023-12-05 MED ORDER — FLUOXETINE HCL 40 MG PO CAPS
40.0000 mg | ORAL_CAPSULE | Freq: Every day | ORAL | 0 refills | Status: DC
  Filled 2023-12-13: qty 90, 90d supply, fill #0

## 2023-12-05 MED ORDER — ARIPIPRAZOLE 5 MG PO TABS
5.0000 mg | ORAL_TABLET | Freq: Every day | ORAL | 0 refills | Status: DC
  Filled 2023-12-13: qty 90, 90d supply, fill #0

## 2023-12-05 MED ORDER — GABAPENTIN 300 MG PO CAPS
300.0000 mg | ORAL_CAPSULE | Freq: Three times a day (TID) | ORAL | 1 refills | Status: DC
  Filled 2023-12-13: qty 90, 30d supply, fill #0

## 2023-12-07 ENCOUNTER — Encounter: Payer: Self-pay | Admitting: Urology

## 2023-12-12 ENCOUNTER — Other Ambulatory Visit: Payer: Self-pay

## 2023-12-13 ENCOUNTER — Other Ambulatory Visit: Payer: Self-pay

## 2023-12-14 ENCOUNTER — Other Ambulatory Visit: Payer: Self-pay

## 2023-12-18 ENCOUNTER — Ambulatory Visit (INDEPENDENT_AMBULATORY_CARE_PROVIDER_SITE_OTHER): Payer: MEDICAID | Admitting: Clinical

## 2023-12-18 ENCOUNTER — Other Ambulatory Visit: Payer: Self-pay

## 2023-12-18 ENCOUNTER — Encounter (HOSPITAL_COMMUNITY): Payer: Self-pay | Admitting: Clinical

## 2023-12-18 DIAGNOSIS — F411 Generalized anxiety disorder: Secondary | ICD-10-CM

## 2023-12-18 DIAGNOSIS — Z8659 Personal history of other mental and behavioral disorders: Secondary | ICD-10-CM | POA: Diagnosis not present

## 2023-12-18 DIAGNOSIS — F1220 Cannabis dependence, uncomplicated: Secondary | ICD-10-CM | POA: Diagnosis not present

## 2023-12-18 DIAGNOSIS — F341 Dysthymic disorder: Secondary | ICD-10-CM | POA: Diagnosis not present

## 2023-12-18 NOTE — Progress Notes (Signed)
 THERAPIST PROGRESS NOTE  Session Time: 1:07pm-2:01pm  Session #4  Participation Level: Active  Behavioral Response: Casual and Neat Alert Anxious and Depressed  Type of Therapy: Individual Therapy  Treatment Goals addressed:  STG: Broderic will identify and decrease cognitive distortions contributing negatively to mood and behavior by identifying 5-7 cognitive distortions and learning how to come up with replacement thoughts that are more balanced, realistic, and helpful.   STG: Score less than 9 on the Patient Health Questionnaire (PHQ-9) as evidenced by intermittent administration of the questionnaire to determine progress.   STG: Rakeen will explore personal core beliefs, rules and assumptions, and cognitive distortions through therapist using Cognitive Behavioral Therapy.   LTG:  Provide psychoeducation on communication techniques such as "I" statements, open-ended questions, reflective listening, assertiveness, fair fighting rules, initiating conversations, and more as needed. STG: Santi will process life events to the extent needed so that he is able to move forward with various areas of life in a better frame of mind per self-report    LTG: Aariv will improve quality of life by reducing consumption of mood-altering substances LTG: Januel will increase coping skills to promote long-term recovery and improve ability to perform daily activities STG: Breland will attend recovery-focused peer-led support group.  STG: Work to develop a marijuana cessation plan and timeline, including through use of a Building control surveyor Exercise and Motivational Interviewing.   LTG: Score less than 5 on the GAD-7 as evidenced by intermittent administration of the questionnaire to determine progress in management of anxiety.    STG: Adan will reduce frequency of avoidant behaviors by 50% as evidenced by self-report in therapy sessions  LTG: Work to Arts development officer from models like  CBT, Stages of Change, DBT, shame resilience theory, ACT, SFBT, MI, trauma-informed therapy and others to be able to manage mental health symptoms, AEB practicing out of session and reporting back.  STG: Learn about boundary types, how to implement them, and how to enforce them so that Sabri feels more empowered and content with being able to maintain more helpful, appropriate boundaries in the future for a more balanced result.  LTG: Learn about elements of a Relapse Prevention Plan and devise Lane's own plan covering basic elements of distractions, supports, consequences, and exit strategies.    LTG: Learn breathing techniques and grounding techniques at an age-appropriate level and demonstrate mastery in session then report independent use of these skills out of session.    ProgressTowards Goals: Progressing  Interventions: CBT, Supportive, and Other: exposure therapy explained  Summary: BOLUWATIFE FLIGHT is a 33 y.o. male who presents with diagnoses of Persistent Depressive Disorder, GAD, history of ADHD that is being evaluated currently, nicotine use disorder, and cannabis use disorder.   He presented oriented x5 and stated he was feeling "pretty good."  CSW evaluated patient's medication compliance, use of coping tools, and self-care, as applicable.  He provided an update on various aspects of his life that are normally discussed in therapy, including that he has stopped smoking marijuana for the past 2 weeks in anticipation of starting a new ADHD medicine.  That has led him to the conclusion that it is not the marijuana that was taking away his motivation and desire to do things, because even off the [facility], he still has no motivation.  He remains bored and uninspired, even though he has plenty of energy.  He reported that he wants to do more productive things with his life, but does not think he will find joy.  It was pointed out to him that this is a cognitive distortion of fortune telling.   He stated his brain tells him, "What's the point?"  He admitted that this was a problem even as a child and talked at length about his experience of being the youngest child and being picked on, laughed at, and made fun of for even trying things such as singing.  Even though he realizes intellectually that failure is not a disaster, he still does not want to try to do things due to fear that he will fail.  He also stated that family members had various expectations for him, were always saying he could be successful at the things they envisioned if he would "only" apply himself.  All of these items and more were discussed in an effort to help him find some desire to be more active in his own life.  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  He was taught to measure on a 1-10 scale how sad he is before and after activities.  He was given a 4-page list of pleasant activities to review to see if ideas could be generated of things that he might be willing to try, although he was encouraged to measure the before and after feelings.  Exposure therapy was described to him as one possible means of approaching his fears, especially since he is so sensitive to being critiqued.  He was also asked to research the veracity of his assumption that everyone who did/does anything significant life has started doing it before his age, by researching people who may have had second careers where they excelled or did not get their break until after the age of 33yo.  Finally we talked about positive affirmations, with CSW explaining toxic positivity and neutrality, assigning him finally the task of coming up with 5 statements about himself that are positive but not toxic.   Suicidal/Homicidal: No without intent/plan  Therapist Response: Patient is progressing AEB engaging in scheduled therapy session.  Throughout the session, CSW gave patient the  opportunity to explore thoughts and feelings associated with current life situations and past/present stressors.   CSW challenged patient gently and appropriately to consider different ways of looking at reported issues. CSW encouraged patient's expression of feelings and validated these using empathy, active listening, open body language, and unconditional positive regard.  More appointments are already in place.  Plan/Recommendations:  Return to therapy in 2 weeks on 4/28, list at least 5 neutral or positive things he can say about himself, go through pleasant activities list to mark things he might be willing to try doing, measure mood before and after activities, research other people who may have started a new career or changed career post-32yo.  Next session should check on whether he tried pairing his study time with a reward.  Diagnosis:  Persistent depressive disorder  GAD (generalized anxiety disorder)  History of ADHD  Cannabis use disorder, moderate, dependence (HCC)  Collaboration of Care: Psychiatrist AEB - psychiatrist can read therapy notes; therapist can and does read psychiatric notes prior to sessions   Patient/Guardian was advised Release of Information must be obtained prior to any record release in order to collaborate their care with an outside provider. Patient/Guardian was advised if they have not already done so to contact the registration department to sign all necessary forms in order for [place] to release information regarding their  care.   Consent: Patient/Guardian gives verbal consent for treatment and assignment of benefits for services provided during this visit. Patient/Guardian expressed understanding and agreed to proceed.   Ancel Kass, LCSW 12/18/2023

## 2023-12-22 ENCOUNTER — Other Ambulatory Visit: Payer: Self-pay

## 2023-12-27 ENCOUNTER — Other Ambulatory Visit: Payer: Self-pay

## 2023-12-28 ENCOUNTER — Other Ambulatory Visit: Payer: Self-pay

## 2023-12-28 ENCOUNTER — Telehealth (HOSPITAL_COMMUNITY): Payer: Self-pay

## 2023-12-28 NOTE — Telephone Encounter (Signed)
 Medication problem - Fax received from [facility] as patient was denied coverage of Qelbree  200 mg due to no evidence he had tried at least 2 preferred other medicatons, either atomoxetine  (Strattera ), clonidine ER (Kapvay), or Guanfacine ER (Intuniv).  [employer] requests patient show evidence he has tried at least two of these suggested alternatives before would cover and can appeal if valid reason for not trying others.

## 2024-01-01 ENCOUNTER — Ambulatory Visit (INDEPENDENT_AMBULATORY_CARE_PROVIDER_SITE_OTHER): Payer: MEDICAID | Admitting: Clinical

## 2024-01-01 ENCOUNTER — Other Ambulatory Visit: Payer: Self-pay

## 2024-01-01 ENCOUNTER — Encounter (HOSPITAL_COMMUNITY): Payer: Self-pay | Admitting: Clinical

## 2024-01-01 DIAGNOSIS — Z8659 Personal history of other mental and behavioral disorders: Secondary | ICD-10-CM

## 2024-01-01 DIAGNOSIS — F341 Dysthymic disorder: Secondary | ICD-10-CM

## 2024-01-01 DIAGNOSIS — F1220 Cannabis dependence, uncomplicated: Secondary | ICD-10-CM

## 2024-01-01 DIAGNOSIS — F411 Generalized anxiety disorder: Secondary | ICD-10-CM | POA: Diagnosis not present

## 2024-01-01 NOTE — Progress Notes (Signed)
 THERAPIST PROGRESS NOTE  Session Time: 1:02pm-2:02pm  Session #5  Participation Level: Active  Behavioral Response: Casual and Neat Alert Anxious and Euthymic  Type of Therapy: Individual Therapy  Treatment Goals addressed:  STG: Lonnel will identify and decrease cognitive distortions contributing negatively to mood and behavior by identifying 5-7 cognitive distortions and learning how to come up with replacement thoughts that are more balanced, realistic, and helpful.   STG: Score less than 9 on the Patient Health Questionnaire (PHQ-9) as evidenced by intermittent administration of the questionnaire to determine progress.   STG: Manav will explore personal core beliefs, rules and assumptions, and cognitive distortions through therapist using Cognitive Behavioral Therapy.   LTG:  Provide psychoeducation on communication techniques such as "I" statements, open-ended questions, reflective listening, assertiveness, fair fighting rules, initiating conversations, and more as needed. STG: Jerik will process life events to the extent needed so that he is able to move forward with various areas of life in a better frame of mind per self-report    LTG: Yahweh will improve quality of life by reducing consumption of mood-altering substances LTG: Stoddard will increase coping skills to promote long-term recovery and improve ability to perform daily activities STG: Donterio will attend recovery-focused peer-led support group.  STG: Work to develop a marijuana cessation plan and timeline, including through use of a Building control surveyor Exercise and Motivational Interviewing.   LTG: Score less than 5 on the GAD-7 as evidenced by intermittent administration of the questionnaire to determine progress in management of anxiety.    STG: Aidynn will reduce frequency of avoidant behaviors by 50% as evidenced by self-report in therapy sessions  LTG: Work to Arts development officer from models like CBT,  Stages of Change, DBT, shame resilience theory, ACT, SFBT, MI, trauma-informed therapy and others to be able to manage mental health symptoms, AEB practicing out of session and reporting back.  STG: Learn about boundary types, how to implement them, and how to enforce them so that Thayne feels more empowered and content with being able to maintain more helpful, appropriate boundaries in the future for a more balanced result.  LTG: Learn about elements of a Relapse Prevention Plan and devise Herb's own plan covering basic elements of distractions, supports, consequences, and exit strategies.    LTG: Learn breathing techniques and grounding techniques at an age-appropriate level and demonstrate mastery in session then report independent use of these skills out of session.    ProgressTowards Goals: Progressing  Interventions: Assertiveness Training, Supportive, and Other: substance abuse  Summary: TOBY SELINSKY is a 33 y.o. male who presents with diagnoses of Persistent Depressive Disorder, GAD, history of ADHD that is being evaluated currently, nicotine  use disorder, and cannabis use disorder.   He presented oriented x5 and stated he was feeling "okay, more motivated."  CSW evaluated patient's medication compliance, use of coping tools, and self-care, as applicable.  He provided an update on various aspects of his life that are normally discussed in therapy, including his marijuana use, his ADD medication hopes, homework that he did, his upcoming exam, and an issue with his partner.  He did follow up with 2 out of 3 homework assignments, was greatly cheered for this, especially as he acknowledged how helpful it was for him to do these things.  He did look up famous people who were older than him at the time that they had their break-throughs.  He found 5 that included, among others, Eino Gravel who did not start painting until he  was 33yo and Ladd Picker who did not get his first big role until  33yo.  He also came up with 5 positive affirmations and has been saying them to himself: I deserve love. I can survive this. I am working toward stability. I am capable. I am handsome.  He was asked to keep saying those to himself throughout coming weeks.  He reported that he was frustrated because he still is not on ADD medication, so nothing came of efforts so far.  He did relent and smoked marijuana, found that it absolutely did affect his amount of energy, left him feeling slowed down for a few days, so he has determined that in addition to not wanting to use marijuana because he wants ADD medicine, he also does not want to use it due to the effects.  He has for a long time had difficult sleeping, usually wakes up at 4am, then every hour thereafter, during which time he feels worse and worse.  He made a decision to stop trying to go to sleep and to just get up when he feels rested, and this has been remarkable in him feeling better as a result.  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  He was provided with psychoeducation and encouragement to keep doing what he has found to work.  We also processed at some length how he handled one of his partners being depressed one day when he was tired after work and did not necessarily have the ability to help as much as he would have liked.  He does not know if his suggestions to her were helpful.  CSW helped him formulate wording of how to follow up with her to find out if what he did was helpful, or if there are other things he could try in the future that might be better.  Wording and boundaries were processed.  We also explored the idea of him doing better with implementing a routine, even though his schedule changes and that means that the routine would have to alter as well.  He stated he has always  done better on a schedule and thus was encouraged to keep exploring how this could be put in place to help with his mood and motivation.  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  He disclosed that he is really uncomfortable with the idea of relaxing -- was asked what it would take for him to give himself permission to relax.  He will consider this.  He further had a sudden realization that every day that he is not working, his mother comes up with projects for him to do either alone or with her.  He does not like being used as presumptive free manual labor, and we talked about what boundaries might look like in that situation.  He had not considered this previously and was willing to continue thinking about it.  He was encouraged to look at the list given at last session about pleasurable activities, that he may find some relaxing endeavors there.  Suicidal/Homicidal: No without intent/plan  Therapist Response: Patient is progressing AEB engaging in scheduled therapy session.  Throughout the session, CSW gave patient the opportunity to explore thoughts and feelings  associated with current life situations and past/present stressors.   CSW challenged patient gently and appropriately to consider different ways of looking at reported issues. CSW encouraged patient's expression of feelings and validated these using empathy, active listening, open body language, and unconditional positive regard.    Plan/Recommendations:  Return to therapy in 2 weeks on 5/12, set boundaries with mother, have follow-up conversation with partner, look at Pleasurable Activities list for things to do that are relaxing  Diagnosis:  GAD (generalized anxiety disorder)  History of ADHD  Cannabis use disorder, moderate, dependence (HCC)  Persistent depressive disorder  Collaboration of Care: Psychiatrist AEB - psychiatrist can read therapy  notes; therapist can and does read psychiatric notes prior to sessions   Patient/Guardian was advised Release of Information must be obtained prior to any record release in order to collaborate their care with an outside provider. Patient/Guardian was advised if they have not already done so to contact the registration department to sign all necessary forms in order for us  to release information regarding their care.   Consent: Patient/Guardian gives verbal consent for treatment and assignment of benefits for services provided during this visit. Patient/Guardian expressed understanding and agreed to proceed.   Ancel Kass, LCSW 01/01/2024

## 2024-01-15 ENCOUNTER — Ambulatory Visit (INDEPENDENT_AMBULATORY_CARE_PROVIDER_SITE_OTHER): Payer: MEDICAID | Admitting: Clinical

## 2024-01-15 DIAGNOSIS — Z91199 Patient's noncompliance with other medical treatment and regimen due to unspecified reason: Secondary | ICD-10-CM

## 2024-01-15 NOTE — Progress Notes (Signed)
 Therapy Progress Note  Patient had an appointment scheduled with therapist on 01/15/2024  at 1:00pm.   He did not arrive for the session by 1:20pm, so was considered a no show.    Encounter Diagnosis  Name Primary?   No-show for appointment Yes     Scott Rang, LCSW 01/15/2024, 1:38 PM

## 2024-01-16 ENCOUNTER — Encounter (HOSPITAL_COMMUNITY): Payer: MEDICAID | Admitting: Student

## 2024-01-16 NOTE — Progress Notes (Deleted)
 BH MD Outpatient Progress Note  01/16/2024 8:25 AM Scott Gordon  MRN:  [id number]  Assessment:  Scott Gordon presents for follow-up evaluation in-person. Patient diagnosis most consistent with persistent depressive disorder, cannabis use disorder, and nicotine  use disorder.   Patient was last seen by me 08/21/23.  He has since been psychiatrically hospitalized after running out of medication and having worsening depression.  Medications have largely been unchanged with the exception of increase fluoxetine  to 40 mg daily.  Patient overall reports in depression and anxiety are well-controlled but states that symptoms of inattention remain a problem.  He does continue to use cannabis which may be contributing to his inattention.  DIVA assessment today was inconclusive regarding childhood symptoms of ADHD but inattention symptoms seem prominent in regards to patient's adulthood.  Patient was amenable to starting viloxazine as well as allowing for me to talk with mom regarding his symptoms as a child. He was amenable to stopping all cannabis use as well as obtaining urine drug screen to verify this.  Based on collateral call with mom, it seems unlikely he had symptoms of inattention or hyperactivity as a child. We will continue to monitor patient's inattention symptoms and decide best next step to improve patient's focus at next visit.   Plan: # Persistent Depressive Disorder Past medication trials:  wellbutrin (migraine, memory loss), fluoxetine  (ineffective), duloxetine  (overflow incontinence), venlafaxine  (ineffective) Status of problem: stable Interventions: --Continue fluoxetine  40 mg daily --Continue Abilify  5 mg daily   # Generalized Anxiety Disorder Past medication trials: hydroxyzine  (ineffective) Status of problem: stable Interventions: -- Continue gabapentin  300 mg TID for anxiety symptoms -- Fluoxetine  as above   # Nicotine  Use Disorder -Continue nicotine  patch 21 mg  daily  -Continue nicotine  gum 4 mg PRN   #Cannabis Use Disorder -Advise cessation   #History of ADHD Unclear if meets criteria due to anxiety, depression, and cannabis use Past medication trials: Adderall (urinary retention), strattera  (urinary retention) Status of problem: Stable Interventions -start guanfacine  200 mg daily  Patient was given contact information for behavioral health clinic and was instructed to call 911 for emergencies.   Subjective:  Chief Complaint:  No chief complaint on file.   Interval History:  Patient reports that his depression and anxiety remain stable.  Denies SI/HI/AVH.  Reports eating and sleeping fairly well.  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  He continues to work at [employer].  He continues to smoke cannabis approximately twice a week by vaping.  He states that this is recreationally and is willing to discontinue should it better aid with his attention as well as clarify ADHD diagnosis.  He was amenable to continuing medications as prescribed at this time.  He was amenable to starting viloxazine to assess for it helping with his focus.  Collateral Scott Gordon (mom) Spoke with patient's mom whom patient lives with to discuss ADHD symptoms.  Patient has always been very "sensitive" and respectful as a child but he did not handle criticism or feedback well. While he did have difficulty completing homework or studying for exams, it appeared to be out of lack of desire to do these tasks rather than due to inattention. On the other hand, his sister had very clear signs of hyperactivity and inattention that was a stark contrast to what patient exhibits. Mom suspects a lot of his symptoms persist due to his sensitivity to  feedback and criticism as he exhibits avoidance regularly in an effort to manage his anxiety.  Visit Diagnosis:  No  diagnosis found.     Past Medical History:  Past Medical History:  Diagnosis Date   ADHD, predominantly inattentive type    Anxiety    Depression    Nicotine  dependence     Past Surgical History:  Procedure Laterality Date   WISDOM TOOTH EXTRACTION      Family History:  Family History  Problem Relation Age of Onset   Diabetes Mother    Non-Hodgkin's lymphoma Mother    Hypertension Father    Diabetes Father    Bone cancer Paternal Aunt     Social History:  Social History   Socioeconomic History   Marital status: Significant Other    Spouse name: Not on file   Number of children: Not on file   Years of education: Not on file   Highest education level: Not on file  Occupational History   Not on file  Tobacco Use   Smoking status: Every Day    Current packs/day: 0.25    Average packs/day: 0.3 packs/day for 3.0 years (0.8 ttl pk-yrs)    Types: Cigarettes   Smokeless tobacco: Never   Tobacco comments:    Previously used vapes, switched to cigarettes about 3 years ago due to cost.  Vaping Use   Vaping status: Former   Substances: Nicotine , Flavoring  Substance and Sexual Activity   Alcohol use: Yes    Comment: social alcohol use   Drug use: Yes    Types: Marijuana   Sexual activity: Yes    Birth control/protection: Condom  Other Topics Concern   Not on file  Social History Narrative   Lives with mother and sister. He is polyamorous and has 2 significant others. Currently unemployed, in school for IT. Independent in ADLs and IADLs.    Social Drivers of Corporate investment banker Strain: Low Risk  (04/06/2022)   Overall Financial Resource Strain (CARDIA)    Difficulty of Paying Living Expenses: Not hard at all  Food Insecurity: No Food Insecurity (11/05/2023)   Hunger Vital Sign    Worried About Running Out of Food in the Last Year: Never true    Ran Out of Food in the Last Year: Never true  Transportation Needs: No Transportation Needs (11/05/2023)   PRAPARE  - Administrator, Civil Service (Medical): No    Lack of Transportation (Non-Medical): No  Physical Activity: Sufficiently Active (04/06/2022)   Exercise Vital Sign    Days of Exercise per Week: 3 days    Minutes of Exercise per Session: 90 min  Stress: Stress Concern Present (04/06/2022)   [employer] of Occupational Health - Occupational Stress Questionnaire    Feeling of Stress : Very much  Social Connections: Socially Isolated (11/05/2023)   Social Connection and Isolation Panel [NHANES]    Frequency of Communication with Friends and Family: Never    Frequency of Social Gatherings with Friends and Family: More than three times a week    Attends Religious Services: Never    Database administrator or Organizations: No    Attends Banker Meetings: Never    Marital Status: Never married    Allergies:  Allergies  Allergen Reactions   Pineapple Hives    Current Medications: Current Outpatient Medications  Medication Sig Dispense Refill   ARIPiprazole  (ABILIFY ) 5 MG tablet Take 1 tablet (5 mg total) by mouth daily.  90 tablet 0   FLUoxetine  (PROZAC ) 40 MG capsule Take 1 capsule (40 mg total) by mouth daily. 90 capsule 0   gabapentin  (NEURONTIN ) 300 MG capsule Take 1 capsule (300 mg total) by mouth 3 (three) times daily. 90 capsule 1   nicotine  (NICODERM CQ  - DOSED IN MG/24 HOURS) 14 mg/24hr patch Place 1 patch (14 mg total) onto the skin daily. 28 patch 0   nicotine  polacrilex (NICORETTE ) 4 MG gum Take 1 each (4 mg total) by mouth as needed for smoking cessation. 100 tablet 0   viloxazine ER (QELBREE ) 200 MG 24 hr capsule Take 1 capsule (200 mg total) by mouth daily. 30 capsule 1   No current facility-administered medications for this visit.    ROS: Review of Systems  Objective:  Psychiatric Specialty Exam: There were no vitals taken for this visit.There is no height or weight on file to calculate BMI.  General Appearance: Fairly Groomed  Eye  Contact:  Good  Speech:  Clear and Coherent and Normal Rate  Volume:  Normal  Mood:  Euthymic  Affect:  Appropriate and Congruent  Thought Process:  Coherent, Goal Directed, and Linear  Orientation:  Full (Time, Place, and Person)  Thought Content: Logical   Suicidal Thoughts:  No  Homicidal Thoughts:  No  Memory:  Remote;   Good  Judgment:  Fair  Insight:  Fair  Psychomotor Activity:  Normal  Concentration:  Concentration: Good and Attention Span: Good              Assets:  Communication Skills Desire for Improvement Financial Resources/Insurance Housing Intimacy Leisure Time Physical Health Resilience Social Support Talents/Skills Transportation Vocational/Educational  ADL's:  Intact  Cognition: WNL  Sleep:  Good   PE: General: well-appearing; no acute distress  Pulm: no increased work of breathing on room air  Strength & Muscle Tone: within normal limits Neuro: no focal neurological deficits observed  Gait & Station: normal  Metabolic Disorder Labs: Lab Results  Component Value Date   HGBA1C 5.2 11/05/2023   MPG 102.54 11/05/2023   MPG 108 09/28/2021   No results found for: "PROLACTIN" Lab Results  Component Value Date   CHOL 181 11/05/2023   TRIG 69 11/05/2023   HDL 63 11/05/2023   CHOLHDL 2.9 11/05/2023   VLDL 14 11/05/2023   LDLCALC 104 (H) 11/05/2023   LDLCALC 111 (H) 12/19/2022   Lab Results  Component Value Date   TSH 0.473 11/05/2023   TSH 0.658 05/24/2023    Therapeutic Level Labs: No results found for: "LITHIUM" No results found for: "VALPROATE" No results found for: "CBMZ"  Screenings: AUDIT    Flowsheet Row [facility] Counselor from [facility]/03/2020 in [facility]  Alcohol Use Disorder Identification Test Final Score (AUDIT) 3 4      GAD-7    Flowsheet Row Counselor from 08/14/2023 in [facility]  at [facility] Visit from 12/19/2022 in [facility] [place] Counselor from 11/23/2022 in [facility] Counselor from 09/07/2022 in [facility] Counselor from 06/08/2022 in [facility]  Total GAD-7 Score 10 10 13 8 6       PHQ2-9    Flowsheet Row Counselor from 08/14/2023 in [facility] at [facility] Visit from 12/19/2022 in [facility] Of  [place]. [facility] Counselor from 11/23/2022 in [facility] Clinical Support from 10/27/2022 in [facility] Counselor from 09/07/2022 in [facility]  PHQ-2 Total Score 5 2 4 3 2   PHQ-9 Total Score 18 12 19 12 7       Flowsheet Row ED from 11/11/2023 in [facility] Department at [facility] Most recent reading at 11/11/2023  8:22 AM Ad[facility] (Discharged[facility] Most recent reading at 11/05/2023  6:27 PM ED from 11/05/2023 in [facility] Most recent reading at 11/05/2023  2:14 PM  C-SSRS RISK CATEGORY No Risk Low Risk Low Risk       Collaboration of Care: Collaboration of Care:   Patient/Guardian was advised Release of Information must be obtained prior to any record release in order to collaborate their care with an outside provider. Patient/Guardian was advised if they have not already done so to contact the registration department to sign all necessary forms in order for us  to release information regarding their care.   Consent: Patient/Guardian gives verbal consent for treatment and assignment of benefits for services provided during this visit. Patient/Guardian expressed understanding and agreed to proceed.   A total of 35 minutes was spent involved in face to face  clinical care, chart review, and documentation.   Augusta Blizzard, MD 01/16/2024, 8:25 AM

## 2024-02-06 ENCOUNTER — Other Ambulatory Visit: Payer: Self-pay

## 2024-02-06 ENCOUNTER — Telehealth (HOSPITAL_COMMUNITY): Payer: Self-pay

## 2024-02-06 DIAGNOSIS — F411 Generalized anxiety disorder: Secondary | ICD-10-CM

## 2024-02-06 DIAGNOSIS — F331 Major depressive disorder, recurrent, moderate: Secondary | ICD-10-CM

## 2024-02-06 MED ORDER — ARIPIPRAZOLE 5 MG PO TABS
5.0000 mg | ORAL_TABLET | Freq: Every day | ORAL | 0 refills | Status: DC
  Filled 2024-02-06: qty 90, 90d supply, fill #0

## 2024-02-06 NOTE — Telephone Encounter (Signed)
 Hello,   Patient and pharmacy is requesting for a refill of Abilify  oral tablets.    JNL, CMA

## 2024-02-06 NOTE — Addendum Note (Signed)
 Addended by: Augusta Blizzard B on: 02/06/2024 09:44 AM   Modules accepted: Orders

## 2024-02-07 ENCOUNTER — Telehealth (HOSPITAL_COMMUNITY): Payer: Self-pay

## 2024-02-07 NOTE — Telephone Encounter (Signed)
 Documented in Sep call interaction.    JNL

## 2024-02-13 ENCOUNTER — Ambulatory Visit (HOSPITAL_COMMUNITY): Payer: MEDICAID | Admitting: Clinical

## 2024-02-27 ENCOUNTER — Ambulatory Visit (INDEPENDENT_AMBULATORY_CARE_PROVIDER_SITE_OTHER): Payer: MEDICAID | Admitting: Clinical

## 2024-02-27 ENCOUNTER — Encounter (HOSPITAL_COMMUNITY): Payer: Self-pay | Admitting: Clinical

## 2024-02-27 DIAGNOSIS — Z8659 Personal history of other mental and behavioral disorders: Secondary | ICD-10-CM

## 2024-02-27 DIAGNOSIS — F411 Generalized anxiety disorder: Secondary | ICD-10-CM

## 2024-02-27 DIAGNOSIS — F341 Dysthymic disorder: Secondary | ICD-10-CM | POA: Diagnosis not present

## 2024-02-27 DIAGNOSIS — F1220 Cannabis dependence, uncomplicated: Secondary | ICD-10-CM | POA: Diagnosis not present

## 2024-02-27 NOTE — Progress Notes (Signed)
 THERAPIST PROGRESS NOTE  Session Time: 2:01pm-2:58pm  Session #6  Virtual Visit via Video Note  I connected with Scott Gordon on 02/27/24 at  2:00 PM EDT by a video enabled telemedicine application and verified that I am speaking with the correct person using two identifiers.  Location: Patient: home Provider: home office   I discussed the limitations of evaluation and management by telemedicine and the availability of in person appointments. The patient expressed understanding and agreed to proceed.   I discussed the assessment and treatment plan with the patient. The patient was provided an opportunity to ask questions and all were answered. The patient agreed with the plan and demonstrated an understanding of the instructions.   The patient was advised to call back or seek an in-person evaluation if the symptoms worsen or if the condition fails to improve as anticipated.  I provided 57 minutes of non-face-to-face time during this encounter.  [name], LCSW   Participation Level: Active  Behavioral Response: Casual and Neat Alert Anxious  Type of Therapy: Individual Therapy  Treatment Goals addressed:  STG: Demoni will identify and decrease cognitive distortions contributing negatively to mood and behavior by identifying 5-7 cognitive distortions and learning how to come up with replacement thoughts that are more balanced, realistic, and helpful.   STG: Score less than 9 on the Patient Health Questionnaire (PHQ-9) as evidenced by intermittent administration of the questionnaire to determine progress.   STG: Raoul will explore personal core beliefs, rules and assumptions, and cognitive distortions through therapist using Cognitive Behavioral Therapy.   LTG:  Provide psychoeducation on communication techniques such as I statements, open-ended questions, reflective listening, assertiveness, fair fighting rules, initiating conversations, and more as  needed. STG: Antwine will process life events to the extent needed so that he is able to move forward with various areas of life in a better frame of mind per self-report    LTG: Ayuub will improve quality of life by reducing consumption of mood-altering substances LTG: Kailer will increase coping skills to promote long-term recovery and improve ability to perform daily activities STG: Xue will attend recovery-focused peer-led support group.  STG: Work to develop a marijuana cessation plan and timeline, including through use of a Building control surveyor Exercise and Motivational Interviewing.   LTG: Score less than 5 on the GAD-7 as evidenced by intermittent administration of the questionnaire to determine progress in management of anxiety.    STG: Nandan will reduce frequency of avoidant behaviors by 50% as evidenced by self-report in therapy sessions  LTG: Work to Arts development officer from models like CBT, Stages of Change, DBT, shame resilience theory, ACT, SFBT, MI, trauma-informed therapy and others to be able to manage mental health symptoms, AEB practicing out of session and reporting back.  STG: Learn about boundary types, how to implement them, and how to enforce them so that Alois feels more empowered and content with being able to maintain more helpful, appropriate boundaries in the future for a more balanced result.  LTG: Learn about elements of a Relapse Prevention Plan and devise Jarrett's own plan covering basic elements of distractions, supports, consequences, and exit strategies.    LTG: Learn breathing techniques and grounding techniques at an age-appropriate level and demonstrate mastery in session then report independent use of these skills out of session.    ProgressTowards Goals: Progressing  Interventions: CBT, Supportive, and Other: substance abuse counseling, relapse prevention  Summary: Scott Gordon is a 33 y.o. male who presents with  diagnoses of  Persistent Depressive Disorder, GAD, history of ADHD that is being evaluated currently, nicotine  use disorder, and cannabis use disorder.   He presented oriented x5 and stated life has been not without hurdles, I've had a hard time getting myself to do stuff.  CSW evaluated patient's medication compliance, use of coping tools, and self-care, as applicable.  He provided an update on various aspects of his life that are normally discussed in therapy, including his difficulty with marijuana cessation, his ongoing lethargy afterward, and his job.  He has found himself using marijuana 2-3 times a week to deal with his daily anxiety that things are not going to happen correctly.  He has found, however, that for approximately 3 days after using, he is lethargic and more depressed.  He also finds that he almost does not even think about it before using it.  We discussed whether he feels the immediate results are a good trade for the subsequent effects and he definitely does not feel the trade is good.  CSW pointed out that nonetheless, even with the current issues, his marijuana use is significantly less than his previous daily use and thus it is progress, but he has difficult trying to think about it this way.  We explored what it might look like for him to sit with his emotions instead of immediately medicating them, which he stated he has never tried to do.  CSW pointed out that as long as he has marijuana available, most users of self-medicating means would tell him he will eventually use it.  We brainstormed and discussed in some detail some of the tools at his disposal including a thorough review of both sides of CBT (cognitive restructuring and behavioral activation), journaling, and sitting with his emotions.  He stated that the journaling is one thing he feels he could start immediately and he realizes doing any of these will involve putting in effort, but they do not feel overwhelming.  He was emphatic that at  this point I want change.  CSW reminded him of the acronym SMART goals and took him through creating a SMART goal for marijuana cessation including relapse prevention.  He has a difficult time seeking or accepting support, so stated that one element of that plan needs to include reaching out to his friends.  CSW explained in depth how to make that goal SMART.   Suicidal/Homicidal: No without intent/plan  Therapist Response: Patient is progressing AEB engaging in scheduled therapy session.  Throughout the session, CSW gave patient the opportunity to explore thoughts and feelings associated with current life situations and past/present stressors.   CSW challenged patient gently and appropriately to consider different ways of looking at reported issues. CSW encouraged patient's expression of feelings and validated these using empathy, active listening, open body language, and unconditional positive regard.  He does not like doing virtual therapy and his next session will be in person.  CSW did explain to him that what we did today was exactly like what would have happened in person.  He will consider this.  Plan/Recommendations:  Return to therapy in 2 weeks on 7/8, set some SMART goals (written) re relapse prevention and marijuana, start journaling thoughts and feelings  Diagnosis:  GAD (generalized anxiety disorder)  Persistent depressive disorder  History of ADHD  Cannabis use disorder, moderate, dependence (HCC)  Collaboration of Care: Psychiatrist AEB -psychiatrist can read therapy notes; therapist can and does read psychiatric notes prior to sessions   Patient/Guardian was advised  Release of Information must be obtained prior to any record release in order to collaborate their care with an outside provider. Patient/Guardian was advised if they have not already done so to contact the registration department to sign all necessary forms in order for us  to release information regarding their care.    Consent: Patient/Guardian gives verbal consent for treatment and assignment of benefits for services provided during this visit. Patient/Guardian expressed understanding and agreed to proceed.   [name], LCSW 02/27/2024

## 2024-03-10 NOTE — Progress Notes (Deleted)
 BH MD Outpatient Progress Note  03/10/2024 3:04 PM Scott Gordon  MRN:  [id number]  Assessment:  Scott Gordon presents for follow-up evaluation. Today, 03/10/24, patient reports ***  Identifying Information: Scott Gordon is a 33 y.o. male with a history of persistent depressive disorder, cannabis use disorder, and nicotine  use disorder who is an established patient with [facility] for management of medications.   Plan:  # Persistent Depressive Disorder Past medication trials:  wellbutrin (migraine, memory loss), fluoxetine  (ineffective), duloxetine  (overflow incontinence), venlafaxine  (ineffective), seroquel , remeron  Status of problem: stable Interventions: --Continue fluoxetine  40 mg daily --Continue Abilify  5 mg daily  11/2023: A1c wnl, Lipid panel LDL 104   # Generalized Anxiety Disorder Past medication trials: hydroxyzine  (ineffective) Status of problem: stable Interventions: -- Continue gabapentin  300 mg TID for anxiety symptoms -- Fluoxetine  as above   # Nicotine  Use Disorder -Continue nicotine  patch 21 mg daily  -Continue nicotine  gum 4 mg PRN   #Cannabis Use Disorder Last 11/2023 UDS+cannabis -Advise cessation   #History of ADHD Unclear if meets criteria due to anxiety, depression, and cannabis use Past medication trials: Adderall (urinary retention), strattera  (urinary retention) Status of problem: Stable Interventions -start viloxazine 200 mg daily  Patient was given contact information for behavioral health clinic and was instructed to call 911 for emergencies.   Subjective:  Chief Complaint: No chief complaint on file.  Interval History: *** [ ]  last visit 12/2023 with Dr. 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, works at [employer] [ ]  smokes cannabis twice a week  [ ]  attended therapy 12/2023, 01/2024 -no show, 02/2024, upcoming appt 03/12/2024 [ ]  questionable ADHD dx,  DIVA assessment with inattention in adulthood but collateral does not confirm childhood sx, patient denied qelbree  from insurance due to no evidence tried 2 other medications (strattera , clonidine ER or guanfacine ER)    12/18/2023 Abilify  5 MG TABS (disp 90, 90d supply)   12/18/2023 Prozac  40 MG CAPS (disp 90, 90d supply)   12/18/2023  Gabapentin  300 MG CAPS (disp 90, 30d supply)   PDMP: Gabapentin  300 12/2023: 90 tabs, 30 days  --can try clonidine, room to go up on the prozac  and abilify .    Visit Diagnosis: No diagnosis found.  Past Psychiatric History:  Diagnoses: MDD, GAD, ADHD Medication trials: Atomoxetine  40 mg daily, Buproprion (migraine, memory loss), Duloxetine   60 mg daily (overflow incontinence), Hydroxyzine  10 mg TID as needed Seroquel  50 mg at bedtime, Trazodone  100 mg at bedtime, Venlafaxine  150 mg daily  (ineffective)  Previous psychiatrist/therapist: Dr. Lynnette Hospitalizations: [facility] 11/2023 for SI and worsening depression, [facility] 09/28/2021-09/30/2021  Suicide attempts: none SIB: *** Hx of violence towards others: *** Current access to guns: *** Hx of trauma/abuse: *** Substance use:  Tobacco: endorses, current, smokes 1 packs per 2 weeks  and endorses, current, 1 pen on vaping per 2 weeks  Cannabis (marijuana): THC vapes every 2 weeks  Cocaine: Denies  Methamphetamines: Denies Psilocybin (mushrooms): Denies Ecstasy (MDMA / molly): Denies LSD (acid): Denies Opiates (fentanyl / heroin): Denies Benzos (Xanax, Klonopin): Denies IV drug use: Denies Prescribed meds abuse: Denies   History of detox: denies History of rehab: denies  Past Medical History:  Past Medical History:  Diagnosis Date   ADHD, predominantly inattentive type    Anxiety    Depression    Nicotine  dependence     Past Surgical History:  Procedure Laterality Date   WISDOM TOOTH EXTRACTION  Family Psychiatric History:  Psych: Sister, Depression  Psych Rx: Unaware  Suicide: Paternal  Uncle  Homicide: Denies  Substance use family hx: Paternal aunt and uncle, undisclosed substance history  Family History:  Family History  Problem Relation Age of Onset   Diabetes Mother    Non-Hodgkin's lymphoma Mother    Hypertension Father    Diabetes Father    Bone cancer Paternal Aunt     Social History:  Academic/Vocational: ***  Social History   Socioeconomic History   Marital status: Significant Other    Spouse name: Not on file   Number of children: Not on file   Years of education: Not on file   Highest education level: Not on file  Occupational History   Not on file  Tobacco Use   Smoking status: Every Day    Current packs/day: 0.25    Average packs/day: 0.3 packs/day for 3.0 years (0.8 ttl pk-yrs)    Types: Cigarettes   Smokeless tobacco: Never   Tobacco comments:    Previously used vapes, switched to cigarettes about 3 years ago due to cost.  Vaping Use   Vaping status: Former   Substances: Nicotine , Flavoring  Substance and Sexual Activity   Alcohol use: Yes    Comment: social alcohol use   Drug use: Yes    Types: Marijuana   Sexual activity: Yes    Birth control/protection: Condom  Other Topics Concern   Not on file  Social History Narrative   Lives with mother and sister. He is polyamorous and has 2 significant others. Currently unemployed, in school for IT. Independent in ADLs and IADLs.    Social Drivers of Corporate investment banker Strain: Low Risk  (04/06/2022)   Overall Financial Resource Strain (CARDIA)    Difficulty of Paying Living Expenses: Not hard at all  Food Insecurity: No Food Insecurity (11/05/2023)   Hunger Vital Sign    Worried About Running Out of Food in the Last Year: Never true    Ran Out of Food in the Last Year: Never true  Transportation Needs: No Transportation Needs (11/05/2023)   PRAPARE - Administrator, Civil Service (Medical): No    Lack of Transportation (Non-Medical): No  Physical Activity:  Sufficiently Active (04/06/2022)   Exercise Vital Sign    Days of Exercise per Week: 3 days    Minutes of Exercise per Session: 90 min  Stress: Stress Concern Present (04/06/2022)   [employer] of Occupational Health - Occupational Stress Questionnaire    Feeling of Stress : Very much  Social Connections: Socially Isolated (11/05/2023)   Social Connection and Isolation Panel    Frequency of Communication with Friends and Family: Never    Frequency of Social Gatherings with Friends and Family: More than three times a week    Attends Religious Services: Never    Database administrator or Organizations: No    Attends Banker Meetings: Never    Marital Status: Never married    Allergies:  Allergies  Allergen Reactions   Pineapple Hives    Current Medications: Current Outpatient Medications  Medication Sig Dispense Refill   ARIPiprazole  (ABILIFY ) 5 MG tablet Take 1 tablet (5 mg total) by mouth daily. 90 tablet 0   FLUoxetine  (PROZAC ) 40 MG capsule Take 1 capsule (40 mg total) by mouth daily. 90 capsule 0   gabapentin  (NEURONTIN ) 300 MG capsule Take 1 capsule (300 mg total) by mouth 3 (three) times daily. 90 capsule  1   nicotine  (NICODERM CQ  - DOSED IN MG/24 HOURS) 14 mg/24hr patch Place 1 patch (14 mg total) onto the skin daily. 28 patch 0   nicotine  polacrilex (NICORETTE ) 4 MG gum Take 1 each (4 mg total) by mouth as needed for smoking cessation. 100 tablet 0   No current facility-administered medications for this visit.    ROS: Review of Systems  Objective:  Psychiatric Specialty Exam: There were no vitals taken for this visit.There is no height or weight on file to calculate BMI.  General Appearance: {Appearance:22683}  Eye Contact:  {BHH EYE CONTACT:22684}  Speech:  {Speech:22685}  Volume:  {Volume (PAA):22686}  Mood:  {BHH MOOD:22306}  Affect:  {Affect (PAA):22687}  Thought Content: {Thought Content:22690}   Suicidal Thoughts:  {ST/HT (PAA):22692}   Homicidal Thoughts:  {ST/HT (PAA):22692}  Thought Process:  {Thought Process (PAA):22688}  Orientation:  {BHH ORIENTATION (PAA):22689}    Memory: Grossly intact ***  Judgment:  {Judgement (PAA):22694}  Insight:  {Insight (PAA):22695}  Concentration:  {Concentration:21399}  Recall: not formally assessed ***  Fund of Knowledge: {BHH GOOD/FAIR/POOR:22877}  Language: {BHH GOOD/FAIR/POOR:22877}  Psychomotor Activity:  {Psychomotor (PAA):22696}  Akathisia:  {BHH YES OR NO:22294}  AIMS (if indicated): {Desc; done/not:10129}  Assets:  {Assets (PAA):22698}  ADL's:  {BHH JIO'D:77709}  Cognition: {chl bhh cognition:304700322}  Sleep:  {BHH GOOD/FAIR/POOR:22877}   PE: General: well-appearing; no acute distress *** Pulm: no increased work of breathing on room air *** Strength & Muscle Tone: {desc; muscle tone:32375} Neuro: no focal neurological deficits observed *** Gait & Station: {PE GAIT ED WJUO:77474}  Metabolic Disorder Labs: Lab Results  Component Value Date   HGBA1C 5.2 11/05/2023   MPG 102.54 11/05/2023   MPG 108 09/28/2021   No results found for: PROLACTIN Lab Results  Component Value Date   CHOL 181 11/05/2023   TRIG 69 11/05/2023   HDL 63 11/05/2023   CHOLHDL 2.9 11/05/2023   VLDL 14 11/05/2023   LDLCALC 104 (H) 11/05/2023   LDLCALC 111 (H) 12/19/2022   Lab Results  Component Value Date   TSH 0.473 11/05/2023   TSH 0.658 05/24/2023    Therapeutic Level Labs: No results found for: LITHIUM No results found for: VALPROATE No results found for: CBMZ  Screenings:  AUDIT    Flowsheet Row [facility] Counselor from 06/03/2020 in [facility]  Alcohol Use Disorder Identification Test Final Score (AUDIT) 3 4   GAD-7    Flowsheet Row Counselor from 08/14/2023 in [facility] at [facility] Visit from 12/19/2022 in [facility] - [facility] Dept Of [place] Counselor from 11/23/2022 in [facility] Counselor from 09/07/2022 in [facility] Counselor from 06/08/2022 in [facility]  Total GAD-7 Score 10 10 13 8 6    PHQ2-9    Flowsheet Row Counselor from 08/14/2023 in [facility] at [facility] Visit from 12/19/2022 in [facility] - [facility] Dept Of . [facility] Counselor from 11/23/2022 in [facility] Clinical Support from 10/27/2022 in [facility] Counselor from 09/07/2022 in [facility]  PHQ-2 Total Score 5 2 4 3 2   PHQ-9 Total Score 18 12 19 12 7    Flowsheet Row ED from 11/11/2023 in [facility] at [facility] Most recent  reading at 11/11/2023  8:22 AM Ad[facility] (Discharged[facility] Most recent reading at 11/05/2023  6:27 PM ED from 11/05/2023 in [facility] Most recent reading at 11/05/2023  2:14 PM  C-SSRS RISK CATEGORY No Risk Low Risk Low Risk    Collaboration of Care: Collaboration of Care: {BH OP Collaboration of Care:21014065}  Patient/Guardian was advised Release of Information must be obtained prior to any record release in order to collaborate their care with an outside provider. Patient/Guardian was advised if they have not already done so to contact the registration department to sign all necessary forms in order for us  to release information regarding their care.   Consent: Patient/Guardian gives verbal consent for treatment and assignment of benefits for services provided during this visit. Patient/Guardian expressed understanding and agreed to proceed.   A total of *** minutes was spent involved in face to face clinical care, chart review,  documentation, and ***.   Corean Minor, MD, PGY-3 03/10/2024, 3:04 PM

## 2024-03-11 ENCOUNTER — Ambulatory Visit (HOSPITAL_COMMUNITY): Payer: MEDICAID | Admitting: Psychiatry
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
## 2024-03-12 ENCOUNTER — Encounter (HOSPITAL_COMMUNITY): Payer: Self-pay | Admitting: Clinical

## 2024-03-12 ENCOUNTER — Ambulatory Visit (INDEPENDENT_AMBULATORY_CARE_PROVIDER_SITE_OTHER): Payer: MEDICAID | Admitting: Clinical

## 2024-03-12 DIAGNOSIS — F341 Dysthymic disorder: Secondary | ICD-10-CM | POA: Diagnosis not present

## 2024-03-12 DIAGNOSIS — F1220 Cannabis dependence, uncomplicated: Secondary | ICD-10-CM | POA: Diagnosis not present

## 2024-03-12 DIAGNOSIS — Z8659 Personal history of other mental and behavioral disorders: Secondary | ICD-10-CM | POA: Diagnosis not present

## 2024-03-12 DIAGNOSIS — F411 Generalized anxiety disorder: Secondary | ICD-10-CM | POA: Diagnosis not present

## 2024-03-12 NOTE — Progress Notes (Signed)
 THERAPIST PROGRESS NOTE  Session Time: 3:00pm-4:00pm  Session #7  Participation Level: Active  Behavioral Response: Casual Alert Anxious and Euthymic  Type of Therapy: Individual Therapy  Treatment Goals addressed:  STG: Marcelino will identify and decrease cognitive distortions contributing negatively to mood and behavior by identifying 5-7 cognitive distortions and learning how to come up with replacement thoughts that are more balanced, realistic, and helpful.   STG: Score less than 9 on the Patient Health Questionnaire (PHQ-9) as evidenced by intermittent administration of the questionnaire to determine progress.   STG: Bill will explore personal core beliefs, rules and assumptions, and cognitive distortions through therapist using Cognitive Behavioral Therapy.   LTG:  Provide psychoeducation on communication techniques such as I statements, open-ended questions, reflective listening, assertiveness, fair fighting rules, initiating conversations, and more as needed. STG: Biff will process life events to the extent needed so that he is able to move forward with various areas of life in a better frame of mind per self-report    LTG: Quinten will improve quality of life by reducing consumption of mood-altering substances LTG: Raman will increase coping skills to promote long-term recovery and improve ability to perform daily activities STG: Keats will attend recovery-focused peer-led support group.  STG: Work to develop a marijuana cessation plan and timeline, including through use of a Building control surveyor Exercise and Motivational Interviewing.   LTG: Score less than 5 on the GAD-7 as evidenced by intermittent administration of the questionnaire to determine progress in management of anxiety.    STG: Aldean will reduce frequency of avoidant behaviors by 50% as evidenced by self-report in therapy sessions  LTG: Work to Arts development officer from models like CBT, Stages  of Change, DBT, shame resilience theory, ACT, SFBT, MI, trauma-informed therapy and others to be able to manage mental health symptoms, AEB practicing out of session and reporting back.  STG: Learn about boundary types, how to implement them, and how to enforce them so that Keltin feels more empowered and content with being able to maintain more helpful, appropriate boundaries in the future for a more balanced result.  LTG: Learn about elements of a Relapse Prevention Plan and devise Hughie's own plan covering basic elements of distractions, supports, consequences, and exit strategies.    LTG: Learn breathing techniques and grounding techniques at an age-appropriate level and demonstrate mastery in session then report independent use of these skills out of session.    ProgressTowards Goals: Progressing  Interventions: Conservator, museum/gallery, Psychosocial Skills: communication, and Supportive   Summary: Scott Gordon is a 33 y.o. male who presents with diagnoses of Persistent Depressive Disorder, GAD, history of ADHD that is being evaluated currently, nicotine  use disorder, and cannabis use disorder.   He presented oriented x5 and stated he felt my anxiety has been higher than I realized, so it's been a mixed bag.  I think my depression is better and I have waves of anxiety sometimes.  CSW evaluated patient's medication compliance, use of coping tools, and self-care, as applicable.  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  We processed how much he is using cannabis currently, and it appears that it is much more under control, having less negative effect on him due to spacing usage out  instead of smoking daily.  This decreased  use has led to his mind being less groggy and he feels that he has more mental clarity.  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  As he finds himself being resistant to this idea, CSW challenged him to think about our previous conversations about how growth always involves change and change always involves loss.  Therefore, we explored in session what he thinks he will lose by making the change to become a better person.  He was very focused on the discomfort of the unknown, which CSW normalized for him.  He stated, I'm used to the certain misery I have now.  He also shared that he fears giving up control at the same time that he realizes he does not really control anything except himself.  This was explored more fully as desired and he made a commitment to continue to work on this idea.  He also brought up intimacy issues with one of his partners who is attracted to people who are emotionally unavailable, and now that he is emotionally more available, she is less attracted to him.  Ideas were generated about using I statements to talk with her, but that he has the right and perhaps obligation to tell her what he needs.  More appointments were scheduled.  Suicidal/Homicidal: No without intent/plan  Therapist Response: Patient is progressing AEB engaging in scheduled therapy session.  Throughout the session, CSW gave patient the opportunity to explore thoughts and feelings associated with current life situations and past/present stressors.   CSW challenged patient gently and appropriately to consider different ways of looking at reported issues. CSW encouraged patient's expression of feelings and validated these using empathy, active listening, open body language, and  unconditional positive regard.   Plan/Recommendations:  Return to therapy at  next scheduled session, consider what he can control (himself) versus what he cannot control (everything else), continue to consider what he might lose from changing in order to perhaps become more comfortable with that idea  Diagnosis:  GAD (generalized anxiety disorder)  Persistent depressive disorder  History of ADHD  Cannabis use disorder, moderate, dependence (HCC)  Collaboration of Care: Psychiatrist AEB -psychiatrist can read therapy notes; therapist can and does read psychiatric notes prior to sessions   Patient/Guardian was advised Release of Information must be obtained prior to any record release in order to collaborate their care with an outside provider. Patient/Guardian was advised if they have not already done so to contact the registration department to sign all necessary forms in order for us  to release information regarding their care.   Consent: Patient/Guardian gives verbal consent for treatment and assignment of benefits for services provided during this visit. Patient/Guardian expressed understanding and agreed to proceed.   [name], LCSW 03/12/2024

## 2024-03-13 ENCOUNTER — Other Ambulatory Visit: Payer: Self-pay

## 2024-03-13 ENCOUNTER — Encounter (HOSPITAL_COMMUNITY): Payer: Self-pay | Admitting: Psychiatry

## 2024-03-13 ENCOUNTER — Ambulatory Visit (HOSPITAL_BASED_OUTPATIENT_CLINIC_OR_DEPARTMENT_OTHER): Payer: MEDICAID | Admitting: Psychiatry

## 2024-03-13 VITALS — BP 126/84 | HR 73 | Wt 164.6 lb

## 2024-03-13 DIAGNOSIS — F17200 Nicotine dependence, unspecified, uncomplicated: Secondary | ICD-10-CM | POA: Insufficient documentation

## 2024-03-13 DIAGNOSIS — F411 Generalized anxiety disorder: Secondary | ICD-10-CM

## 2024-03-13 DIAGNOSIS — F3341 Major depressive disorder, recurrent, in partial remission: Secondary | ICD-10-CM | POA: Diagnosis not present

## 2024-03-13 DIAGNOSIS — F1220 Cannabis dependence, uncomplicated: Secondary | ICD-10-CM

## 2024-03-13 MED ORDER — NICOTINE POLACRILEX 4 MG MT GUM
4.0000 mg | CHEWING_GUM | OROMUCOSAL | 0 refills | Status: DC | PRN
  Filled 2024-03-13: qty 100, 28d supply, fill #0

## 2024-03-13 MED ORDER — ARIPIPRAZOLE 5 MG PO TABS
5.0000 mg | ORAL_TABLET | Freq: Every day | ORAL | 1 refills | Status: DC
  Filled 2024-03-13: qty 30, 30d supply, fill #0

## 2024-03-13 MED ORDER — GABAPENTIN 300 MG PO CAPS
300.0000 mg | ORAL_CAPSULE | Freq: Three times a day (TID) | ORAL | 1 refills | Status: DC
  Filled 2024-03-13: qty 90, 30d supply, fill #0

## 2024-03-13 MED ORDER — FLUOXETINE HCL 20 MG PO CAPS
60.0000 mg | ORAL_CAPSULE | Freq: Every day | ORAL | 1 refills | Status: DC
  Filled 2024-03-13: qty 90, 30d supply, fill #0

## 2024-03-13 NOTE — Addendum Note (Signed)
 Addended by: CARVIN CROCK on: 03/13/2024 04:59 PM   Modules accepted: Level of Service

## 2024-03-13 NOTE — Patient Instructions (Addendum)
 For Medicaid:                   [facility] [phone]) or Delon Salm at [facility].  [facility] (part of [facility]) [phone] has multiple providers.  [facility] [phone] has several providers specializing in ADHD/Bipolar= Delon Dimitri, PhD is expert at Adult ADHD,  Kala Annambhotla, PhD treats adults with both diagnoses.    [place] QUITLINE:  [email or web address] Get free tobacco cessation help 24/7 in several ways: 1-800-QUIT-NOW [phone])  Increase your prozac  to 60mg   Continue your gabapentin  and abilify   Work on a goal towards cannabis and tobacco cessation

## 2024-03-21 ENCOUNTER — Other Ambulatory Visit: Payer: Self-pay

## 2024-03-26 ENCOUNTER — Ambulatory Visit (HOSPITAL_COMMUNITY): Payer: MEDICAID | Admitting: Clinical

## 2024-04-09 ENCOUNTER — Encounter (HOSPITAL_COMMUNITY): Payer: Self-pay | Admitting: Clinical

## 2024-04-09 ENCOUNTER — Ambulatory Visit (HOSPITAL_COMMUNITY): Payer: MEDICAID | Admitting: Clinical

## 2024-04-09 DIAGNOSIS — F341 Dysthymic disorder: Secondary | ICD-10-CM

## 2024-04-09 DIAGNOSIS — F1220 Cannabis dependence, uncomplicated: Secondary | ICD-10-CM | POA: Diagnosis not present

## 2024-04-09 DIAGNOSIS — F411 Generalized anxiety disorder: Secondary | ICD-10-CM

## 2024-04-09 NOTE — Progress Notes (Signed)
 THERAPIST PROGRESS NOTE  Session Time: 3:06pm-4:01pm   Session #8  Virtual Visit via Video Note  I connected with Scott Gordon on 04/09/24 at  3:00 PM EDT by a video enabled telemedicine application and verified that I am speaking with the correct person using two identifiers.  Location: Patient: home Provider: home office   I discussed the limitations of evaluation and management by telemedicine and the availability of in person appointments. The patient expressed understanding and agreed to proceed.   I discussed the assessment and treatment plan with the patient. The patient was provided an opportunity to ask questions and all were answered. The patient agreed with the plan and demonstrated an understanding of the instructions.   The patient was advised to call back or seek an in-person evaluation if the symptoms worsen or if the condition fails to improve as anticipated.  I provided 55 minutes of non-face-to-face time during this encounter.  [name], LCSW   Participation Level: Active  Behavioral Response: Casual Alert Euthymic  Type of Therapy: Individual Therapy  Treatment Goals addressed:  STG: Menelik will identify and decrease cognitive distortions contributing negatively to mood and behavior by identifying 5-7 cognitive distortions and learning how to come up with replacement thoughts that are more balanced, realistic, and helpful.   STG: Score less than 9 on the Patient Health Questionnaire (PHQ-9) as evidenced by intermittent administration of the questionnaire to determine progress.   STG: Kaisei will explore personal core beliefs, rules and assumptions, and cognitive distortions through therapist using Cognitive Behavioral Therapy.   LTG:  Provide psychoeducation on communication techniques such as I statements, open-ended questions, reflective listening, assertiveness, fair fighting rules, initiating conversations, and more as  needed. STG: Dresean will process life events to the extent needed so that he is able to move forward with various areas of life in a better frame of mind per self-report    LTG: Aadvik will improve quality of life by reducing consumption of mood-altering substances LTG: Marv will increase coping skills to promote long-term recovery and improve ability to perform daily activities STG: [place] will attend recovery-focused peer-led support group.  STG: Work to develop a marijuana cessation plan and timeline, including through use of a Building control surveyor Exercise and Motivational Interviewing.   LTG: Score less than 5 on the GAD-7 as evidenced by intermittent administration of the questionnaire to determine progress in management of anxiety.    STG: Ismeal will reduce frequency of avoidant behaviors by 50% as evidenced by self-report in therapy sessions  LTG: Work to Arts development officer from models like CBT, Stages of Change, DBT, shame resilience theory, ACT, SFBT, MI, trauma-informed therapy and others to be able to manage mental health symptoms, AEB practicing out of session and reporting back.  STG: Learn about boundary types, how to implement them, and how to enforce them so that Younis feels more empowered and content with being able to maintain more helpful, appropriate boundaries in the future for a more balanced result.  LTG: Learn about elements of a Relapse Prevention Plan and devise Jontavius's own plan covering basic elements of distractions, supports, consequences, and exit strategies.    LTG: Learn breathing techniques and grounding techniques at an age-appropriate level and demonstrate mastery in session then report independent use of these skills out of session.    ProgressTowards Goals: Progressing  Interventions: CBT and Supportive   Summary: Scott Gordon is a 33 y.o. male who presents with diagnoses of Persistent Depressive Disorder, GAD,  history of ADHD  that is being evaluated currently, nicotine  use disorder, and cannabis use disorder.   He presented oriented x5 and stated he felt well.  CSW evaluated patient's medication compliance, use of coping tools, and self-care, as applicable.  He provided an update on various aspects of his life that are normally discussed in therapy, including his enjoyable vacation, his relationship status with both partners, a new job he will be starting, and his goals.  His vacation in [place]  helped him to relax considerably.  He is communicating and being more intimate with both partners, is aware of potential challenges to each relationship and feels able to address them as needed, feels they are in a good spot currently.  He got a new job which will start 8/12 with a pay rate $3 per hour higher than his current pay and with a 40-hour workweek.  He will only be off on Mondays, so will no longer be able to see CSW who works in a different office devoted to children only on Mondays.  CSW spoke with him about referring to another therapist and he is willing to do this.  He will have already stated his new job when the last 2 appointments that we have scheduled occur, so he is not sure whether he can get the time off.  He does not want to cancel them yet, but will let CSW know.  The rest of the session was spent in talking about his realization that in order to achieve the goals he has, he cannot continue to live the relaxed life he has been living to date.  He does waste a lot of time, he thinks, so wants to make positive changes.  CSW asked him what he is WILLING to do to make these changes, as that seems to be the sticking point usually, and he ultimately decided that he must become willing to be uncomfortable.  He has already started a little in that by getting out of his comfort zone and asking his very busy uncle to join him on a project, rather than deciding for this person that he is too busy to be asked.  The uncle has  joined the project and they are enjoying it.  CSW pointed out all that was achieved in just this one instance when he did not try to control something that was not actually in his control.  He continues to talk about needing structure in his life, having a planner that he intends to start using, wondered if he is using a reward system whether he should still get the reward (game time at night for instance) if he does not achieve the tasks (doing work on his goals in the morning for instance).  CSW informed him that this would be like rewarding a child for not doing what they were supposed to, and he was able to identify through this example the importance of following through on intention.  Finally, he talked about how he worries all the time, so CSW provided all necessary background and instructional material on the CBT technique of Scheduling Worry Time.  This was well received and he will give it a try.  Suicidal/Homicidal: No without intent/plan  Therapist Response: Patient is progressing AEB engaging in scheduled therapy session.  Throughout the session, CSW gave patient the opportunity to explore thoughts and feelings associated with current life situations and past/present stressors.   CSW challenged patient gently and appropriately to consider different ways of looking at reported  issues. CSW encouraged patient's expression of feelings and validated these using empathy, active listening, open body language, and unconditional positive regard.   Plan/Recommendations:  Return to therapy as decided, possibly with new therapist,try out Scheduling Worry Time  Diagnosis:  Persistent depressive disorder  GAD (generalized anxiety disorder)  Cannabis use disorder, moderate, dependence (HCC)  Collaboration of Care: Psychiatrist AEB -psychiatrist can read therapy notes; therapist can and does read psychiatric notes prior to sessions   Patient/Guardian was advised Release of Information must be obtained  prior to any record release in order to collaborate their care with an outside provider. Patient/Guardian was advised if they have not already done so to contact the registration department to sign all necessary forms in order for us  to release information regarding their care.   Consent: Patient/Guardian gives verbal consent for treatment and assignment of benefits for services provided during this visit. Patient/Guardian expressed understanding and agreed to proceed.   [name], LCSW 04/09/2024

## 2024-04-17 ENCOUNTER — Telehealth (HOSPITAL_COMMUNITY): Payer: Self-pay | Admitting: Clinical
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
## 2024-04-22 ENCOUNTER — Ambulatory Visit (HOSPITAL_COMMUNITY): Payer: MEDICAID | Admitting: Psychiatry

## 2024-04-22 ENCOUNTER — Telehealth (HOSPITAL_COMMUNITY): Payer: Self-pay | Admitting: Psychiatry

## 2024-04-22 NOTE — Telephone Encounter (Addendum)
 Called patient at 1:05pm [phone] regarding scheduled appointment at 1pm. No answer, left HIPAA compliant voicemail regarding rescheduling appointment with front desk #.   Of note,  10/2023: No show for appointment with [facility] 01/2024: No show for appointment with [facility] 01/2024: No show for appointment with Dr. Lynnette  02/2024: Patient cancelled appointment 03/2024: Patient cancelled appointment

## 2024-04-29 ENCOUNTER — Encounter (HOSPITAL_COMMUNITY): Payer: Self-pay

## 2024-04-29 ENCOUNTER — Ambulatory Visit (INDEPENDENT_AMBULATORY_CARE_PROVIDER_SITE_OTHER): Payer: MEDICAID | Admitting: Licensed Clinical Social Worker

## 2024-04-29 DIAGNOSIS — F1220 Cannabis dependence, uncomplicated: Secondary | ICD-10-CM

## 2024-04-29 DIAGNOSIS — F341 Dysthymic disorder: Secondary | ICD-10-CM | POA: Diagnosis not present

## 2024-04-29 DIAGNOSIS — F411 Generalized anxiety disorder: Secondary | ICD-10-CM | POA: Diagnosis not present

## 2024-04-29 NOTE — Progress Notes (Signed)
 THERAPIST PROGRESS NOTE   Session Date: 04/29/2024  Session Time: 1000 - 1102  Participation Level: Active  MSE/Presentation: Behavior: Appropriate and Sharing Speech: Normal Thought Process: Coherent and Relevant Cognition: Alert and Appropriate Mood: Euthymic Affect: Congruent Insight: Appropriate and Good Appearance: Casual  Type of Therapy: Individual Therapy  Treatment Goals addressed:   Progressing (10) STG: Scott Gordon will explore personal core beliefs, rules and assumptions, and cognitive distortions through therapist using Cognitive Behavioral Therapy.   (OP Depression) LTG:  Provide psychoeducation on communication techniques such as I statements, open-ended questions, reflective listening, assertiveness, fair fighting rules, initiating conversations, and more as needed. (OP Depression) STG: Scott Gordon will process life events to the extent needed so that he is able to move forward with various areas of life in a better frame of mind per self-report   (OP Depression) LTG: Scott Gordon will increase coping skills to promote long-term recovery and improve ability to perform daily activities (Substance Use) STG: Work to develop a marijuana cessation plan and timeline, including through use of a Building control surveyor Exercise and Motivational Interviewing.   (Substance Use) LTG: Score less than 5 on the GAD-7 as evidenced by intermittent administration of the questionnaire to determine progress in management of anxiety.   (Anxiety) STG: Scott Gordon will reduce frequency of avoidant behaviors by 50% as evidenced by self-report in therapy sessions (Anxiety) LTG: Work to Arts development officer from models like CBT, Stages of Change, DBT, shame resilience theory, ACT, SFBT, MI, trauma-informed therapy and others to be able to manage mental health symptoms, AEB practicing out of session and reporting back.  . (Anxiety) STG: Learn about boundary types, how to implement them, and how to enforce  them so that Scott Gordon feels more empowered and content with being able to maintain more helpful, appropriate boundaries in the future for a more balanced result.   (Anxiety) LTG: Learn about elements of a Relapse Prevention Plan and devise Scott Gordon's own plan covering basic elements of distractions, supports, consequences, and exit strategies.   (Substance Use)  Not Progressing (1) LTG: Scott Gordon will improve quality of life by reducing consumption of mood-altering substances (Substance Use)  Completed/Met (2) STG: Score less than 9 on the Patient Health Questionnaire (PHQ-9) as evidenced by intermittent administration of the questionnaire to determine progress.   (OP Depression) LTG: Learn breathing techniques and grounding techniques at an age-appropriate level and demonstrate mastery in session then report independent use of these skills out of session.   (Anxiety)  Not Applicable (2) STG: Scott Gordon will identify and decrease cognitive distortions contributing negatively to mood and behavior by identifying 5-7 cognitive distortions and learning how to come up with replacement thoughts that are more balanced, realistic, and helpful.   (OP Depression) STG: Scott Gordon will attend recovery-focused peer-led support group. (Substance Use)  Progress Towards Goals: Progressing  Interventions: CBT, Motivational Interviewing, Solution Focused, and Supportive  Summary: Scott Gordon is a 33 y.o. male with psych history of MDD, GAD, and Cannabis use disorder, presenting for follow-up therapy session in efforts to improve management of presenting symptoms.   Patient actively engaged in session, presenting in pleasant moods overall with minimal anxiousness, and congruent affect throughout duration of visit. Patient openly engaged in introductory check-in, sharing of doing well, proving to engage in reflection of prior tx hx of both INPT and OPT, and transitions resulting in being aligned with new  provider. Pt actively engaged in processing areas for continued work and development of rapport, engaging in assessing presenting depressive and anxious sxs  via PHQ-9 and GAD-7, reflecting on variances in scores in comparison to prior screenings. Pt actively engaged in extensive review of individualized goals outlined in tx plan, reflecting on individual perspectives of progress made throughout tx thus far, detailing of having actively worked towards improving frame of mind and thought patterns in efforts to be more positive, thoughts and feelings surrounding current and hx of substance use, and overall management of anxiousness. Pt actively processed continued areas for work, overall progression towards goals, and feelings related to readiness as well as reluctance to change substance use patterns.  Patient responded well to interventions. Patient continues to meet criteria for MDD, GAD, and Cannabis use disorder. Patient will continue to benefit from engagement in outpatient therapy due to being the least restrictive service to meet presenting needs.      04/29/2024   10:09 AM 08/14/2023    1:13 PM 12/19/2022    2:52 PM 11/23/2022    2:11 PM  GAD 7 : Generalized Anxiety Score  Nervous, Anxious, on Edge 2 1 1 2   Control/stop worrying 1 1 1 1   Worry too much - different things 1 1 2 1   Trouble relaxing 1 3 2 3   Restless 1 2 1 3   Easily annoyed or irritable 0 1 2 2   Afraid - awful might happen 0 1 1 1   Total GAD 7 Score 6 10 10 13   Anxiety Difficulty Somewhat difficult Very difficult  Very difficult      04/29/2024   10:06 AM 08/14/2023    1:10 PM 12/19/2022    2:51 PM 11/23/2022    2:09 PM 10/27/2022    1:31 PM  Depression screen PHQ 2/9  Decreased Interest 1 3 1 2 1   Down, Depressed, Hopeless 0 2 1 2 2   PHQ - 2 Score 1 5 2 4 3   Altered sleeping 2 2 3 3 1   Tired, decreased energy 2 3 2 3 3   Change in appetite 0 1 1 2 1   Feeling bad or failure about yourself  1 2 1 3 2   Trouble  concentrating 1 3 2 3 1   Moving slowly or fidgety/restless 0 1 0 0 0  Suicidal thoughts 0 1 1 1 1   PHQ-9 Score 7 18 12 19 12   Difficult doing work/chores Somewhat difficult Extremely dIfficult Somewhat difficult Very difficult    Flowsheet Row ED from 11/11/2023 in [facility] Department at [facility] Most recent reading at 11/11/2023  8:22 AM [facility] Most recent reading at 11/05/2023  6:27 PM ED from 11/05/2023 in [facility] Most recent reading at 11/05/2023  2:14 PM  C-SSRS RISK CATEGORY No Risk Low Risk Low Risk    Suicidal/Homicidal: None, No plan to harm self or others  Therapist Response: Clinician utilized CBT, MI, supportive reflections, and solution focused interventions to support patient in efforts to address presenting sxs and challenges surrounding presenting stressors.  Clinician actively greeted pt upon presenting for virtual visit, assessing presenting moods and affect. Openly engaged pt in introductory check-in, engaging in introductory session, utilizing open ended questions to explore presenting moods and affect. Clinician further utilized open ended questions to explore pt's hx of prior tx and recent transitions resulting in being aligned with new provider. provided general overview of today's initial visit, exploring pt's receptiveness to reviewing/updating tx plan to allow collaborative review of tx goals, exploration of progress, and continued areas for work. Engaged in  assessing presenting depressive and anxious sxs via PHQ-9 and GAD-7 to obtain baseline scores. Engaged in review of individualized treatment goals outlined in tx plan, eliciting patient's thoughts and perspectives surrounding progress, lack thereof, and continued areas for work.  Clinician reassessed severity of presenting sxs, and presence of any safety concerns. Clinician provided  support and empathy to patient during session.  Homework: N/A.  Plan: Return again in 2 weeks.  Diagnosis:  Encounter Diagnoses  Name Primary?   Persistent depressive disorder Yes   GAD (generalized anxiety disorder)    Cannabis use disorder, moderate, dependence (HCC)     Collaboration of Care: Psychiatrist AEB provider documentation available in EHR.  Patient/Guardian was advised Release of Information must be obtained prior to any record release in order to collaborate their care with an outside provider. Patient/Guardian was advised if they have not already done so to contact the registration department to sign all necessary forms in order for us  to release information regarding their care.   Consent: Patient/Guardian gives verbal consent for treatment and assignment of benefits for services provided during this visit. Patient/Guardian expressed understanding and agreed to proceed.   Virtual Visit via Video Note  I connected with [name] on 04/29/24 at 10:00 AM EDT by a video enabled telemedicine application and verified that I am speaking with the correct person using two identifiers.  Location: Patient: Home Provider: Home Office   I discussed the limitations of evaluation and management by telemedicine and the availability of in person appointments. The patient expressed understanding and agreed to proceed.   I discussed the assessment and treatment plan with the patient. The patient was provided an opportunity to ask questions and all were answered. The patient agreed with the plan and demonstrated an understanding of the instructions.   The patient was advised to call back or seek an in-person evaluation if the symptoms worsen or if the condition fails to improve as anticipated.  I provided 62 minutes of non-face-to-face time during this encounter.  [name], MSW, LCSW 04/29/2024,  10:57 AM

## 2024-05-01 ENCOUNTER — Ambulatory Visit (HOSPITAL_COMMUNITY): Payer: MEDICAID | Admitting: Clinical

## 2024-05-13 ENCOUNTER — Ambulatory Visit (INDEPENDENT_AMBULATORY_CARE_PROVIDER_SITE_OTHER): Payer: MEDICAID | Admitting: Licensed Clinical Social Worker

## 2024-05-13 DIAGNOSIS — F1220 Cannabis dependence, uncomplicated: Secondary | ICD-10-CM

## 2024-05-13 DIAGNOSIS — F331 Major depressive disorder, recurrent, moderate: Secondary | ICD-10-CM | POA: Diagnosis not present

## 2024-05-13 DIAGNOSIS — F411 Generalized anxiety disorder: Secondary | ICD-10-CM

## 2024-05-13 NOTE — Progress Notes (Unsigned)
 THERAPIST PROGRESS NOTE   Session Date: 05/13/2024  Session Time: 1405 - 1455  Participation Level: Active  MSE/Presentation: Behavior: Appropriate and Sharing Speech: Normal Thought Process: Coherent and Relevant Cognition: Alert and Appropriate Mood: Euthymic Affect: Congruent Insight: Appropriate and Good Appearance: Casual  Type of Therapy: Individual Therapy  Treatment Goals addressed:   Progressing (10) STG: [name] will explore personal core beliefs, rules and assumptions, and cognitive distortions through therapist using Cognitive Behavioral Therapy.   (OP Depression) LTG:  Provide psychoeducation on communication techniques such as I statements, open-ended questions, reflective listening, assertiveness, fair fighting rules, initiating conversations, and more as needed. (OP Depression) STG: Scott Gordon will process life events to the extent needed so that he is able to move forward with various areas of life in a better frame of mind per self-report   (OP Depression) LTG: Scott Gordon will increase coping skills to promote long-term recovery and improve ability to perform daily activities (Substance Use) STG: Work to develop a marijuana cessation plan and timeline, including through use of a Building control surveyor Exercise and Motivational Interviewing.   (Substance Use) LTG: Score less than 5 on the GAD-7 as evidenced by intermittent administration of the questionnaire to determine progress in management of anxiety.   (Anxiety) STG: Scott Gordon will reduce frequency of avoidant behaviors by 50% as evidenced by self-report in therapy sessions (Anxiety) LTG: Work to Arts development officer from models like CBT, Stages of Change, DBT, shame resilience theory, ACT, SFBT, MI, trauma-informed therapy and others to be able to manage mental health symptoms, AEB practicing out of session and reporting back.  . (Anxiety) STG: Learn about boundary types, how to implement them, and how to enforce them  so that Scott Gordon feels more empowered and content with being able to maintain more helpful, appropriate boundaries in the future for a more balanced result.   (Anxiety) LTG: Learn about elements of a Relapse Prevention Plan and devise Scott Gordon's own plan covering basic elements of distractions, supports, consequences, and exit strategies.   (Substance Use)  Not Progressing (1) LTG: Scott Gordon will improve quality of life by reducing consumption of mood-altering substances (Substance Use)  Completed/Met (2) STG: Score less than 9 on the Patient Health Questionnaire (PHQ-9) as evidenced by intermittent administration of the questionnaire to determine progress.   (OP Depression) LTG: Learn breathing techniques and grounding techniques at an age-appropriate level and demonstrate mastery in session then report independent use of these skills out of session.   (Anxiety)  Progress Towards Goals: Progressing  Interventions: CBT, Motivational Interviewing, Solution Focused, and Supportive  Summary: Scott Gordon is a 33 y.o. male with psych history of MDD, GAD, and Cannabis use disorder, presenting for follow-up therapy session in efforts to improve management of presenting symptoms.   Patient actively engaged in session, presenting in pleasant moods overall with minimal anxiousness, and congruent affect throughout duration of visit. Patient openly engaged in introductory check-in, sharing of Things have been pretty good actually, further detailing of adjusting to new job, finding to experience positive feelings surrounding such. Pt detailed of having not had medication for the past 3.5 weeks due to financial factors, expressing interest in potential continued discontinuation of medications, but requesting oversight from psychiatrist throughout process.  Actively processed factors contributing to discontinuing medication, expressing of having noticed loss of appetite but no other neg s/e such as  feelings of worthlessness or depressive thoughts or feelings.  Actively processed potential factors contributing to decreased appetite to include less physical activity with job change, and  further exploring reduction in [facility] use as potential contributing factors.  Actively engaged in reassessing presenting depressive and anxious symptoms via PHQ-9 and GAD-7, noting of continued downward trend and anxious symptoms and minor increase in depressive symptoms, solely attributed to variances in appetite and fatigue/tiredness.  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  Patient responded well to interventions. Patient continues to meet criteria for MDD, GAD, and Cannabis use disorder. Patient will continue to benefit from engagement in outpatient therapy due to being the least restrictive service to meet presenting needs.      05/13/2024    2:28 PM 04/29/2024   10:09 AM 08/14/2023    1:13 PM 12/19/2022    2:52 PM  GAD 7 : Generalized Anxiety Score  Nervous, Anxious, on Edge 1 2 1 1   Control/stop worrying 0 1 1 1   Worry too much - different things 0 1 1 2   Trouble relaxing 1 1 3 2   Restless 1 1 2 1   Easily annoyed or irritable 0 0 1 2  Afraid - awful might happen 0 0 1 1  Total GAD 7 Score 3 6 10 10   Anxiety Difficulty Somewhat difficult Somewhat difficult Very difficult       05/13/2024    2:40 PM 04/29/2024   10:06 AM 08/14/2023    1:10 PM 12/19/2022    2:51 PM 11/23/2022    2:09 PM  Depression screen PHQ 2/9  Decreased Interest 1 1 3 1 2   Down, Depressed, Hopeless 1 0 2 1 2   PHQ - 2 Score 2 1 5 2 4   Altered sleeping 1 2 2 3 3   Tired, decreased energy 3 2 3 2 3   Change in appetite 2 0 1 1 2   Feeling bad or failure about yourself  1 1 2 1 3   Trouble concentrating 1 1 3 2 3   Moving slowly or fidgety/restless 0 0 1 0 0  Suicidal thoughts 0 0 1 1 1   PHQ-9 Score 10 7 18 12 19    Difficult doing work/chores Very difficult Somewhat difficult Extremely dIfficult Somewhat difficult Very difficult   Flowsheet Row ED from 11/11/2023 in [facility] Emergency Department at [facility] Most recent reading at 11/11/2023  8:22 AM Ad[facility] (Dis[facility] Most recent reading at 11/05/2023  6:27 PM ED from 11/05/2023 in [facility] Most recent reading at 11/05/2023  2:14 PM  C-SSRS RISK CATEGORY No Risk Low Risk Low Risk    Suicidal/Homicidal: None, No plan to harm self or others  Therapist Response: Clinician utilized CBT, MI, supportive reflections, and solution focused interventions to support patient in efforts to address presenting sxs and challenges surrounding presenting stressors.  Clinician actively greeted pt upon presenting for virtual visit, assessing presenting moods and affect. Openly engaged pt in introductory check-in, utilizing open ended questions and names of eliciting patient's recounts of events of recent weeks, presenting stressors, and individual means of managing such.  Utilized active listening techniques to provide support and validation and patient's recounts of events and identification of thoughts, feelings, and perspectives. Engaged in assessing presenting depressive and anxious sxs via PHQ-9 and GAD-7, exploring variances in current and previous screening scores, and processing factors contributing to variances.  Utilized psycho Ed, CBT, MI, and solution focused techniques to aid patient in processing observed variances in appetite and self-elected  discontinuation of medication.  Patient proves to be maintaining minimal progress towards identified treatment goals.  Homework: N/A.  Plan: Return again in 2 weeks.  Diagnosis:  Encounter Diagnoses  Name Primary?   GAD (generalized anxiety disorder) Yes   MDD (major depressive disorder), recurrent episode, moderate  (HCC)    Cannabis use disorder, moderate, dependence (HCC)      Collaboration of Care: Psychiatrist AEB provider documentation available in EHR.  Patient/Guardian was advised Release of Information must be obtained prior to any record release in order to collaborate their care with an outside provider. Patient/Guardian was advised if they have not already done so to contact the registration department to sign all necessary forms in order for us  to release information regarding their care.   Consent: Patient/Guardian gives verbal consent for treatment and assignment of benefits for services provided during this visit. Patient/Guardian expressed understanding and agreed to proceed.   Virtual Visit via Video Note  I connected with Scott Gordon on 05/13/24 at  2:00 PM EDT by a video enabled telemedicine application and verified that I am speaking with the correct person using two identifiers.  Location: Patient: Home Provider: Home Office   I discussed the limitations of evaluation and management by telemedicine and the availability of in person appointments. The patient expressed understanding and agreed to proceed.   I discussed the assessment and treatment plan with the patient. The patient was provided an opportunity to ask questions and all were answered. The patient agreed with the plan and demonstrated an understanding of the instructions.   The patient was advised to call back or seek an in-person evaluation if the symptoms worsen or if the condition fails to improve as anticipated.  I provided 50 minutes of non-face-to-face time during this encounter.  [name], MSW, LCSW 05/13/2024,  2:42 PM

## 2024-05-14 ENCOUNTER — Encounter (HOSPITAL_COMMUNITY): Payer: Self-pay | Admitting: Licensed Clinical Social Worker

## 2024-05-22 ENCOUNTER — Ambulatory Visit (HOSPITAL_COMMUNITY): Payer: MEDICAID | Admitting: Clinical

## 2024-05-27 ENCOUNTER — Encounter (HOSPITAL_COMMUNITY): Payer: Self-pay

## 2024-05-27 ENCOUNTER — Other Ambulatory Visit: Payer: Self-pay

## 2024-05-27 ENCOUNTER — Ambulatory Visit (INDEPENDENT_AMBULATORY_CARE_PROVIDER_SITE_OTHER): Payer: MEDICAID | Admitting: Licensed Clinical Social Worker

## 2024-05-27 DIAGNOSIS — Z91199 Patient's noncompliance with other medical treatment and regimen due to unspecified reason: Secondary | ICD-10-CM

## 2024-05-27 MED ORDER — ONDANSETRON 8 MG PO TBDP
8.0000 mg | ORAL_TABLET | Freq: Three times a day (TID) | ORAL | 0 refills | Status: DC | PRN
  Filled 2024-05-27: qty 20, 7d supply, fill #0

## 2024-05-27 NOTE — Progress Notes (Signed)
 THERAPIST PROGRESS NOTE   Session Date: 05/27/2024  Session Time: 1300  Scott Gordon    Clinician attempted to connect with patient for scheduled appointment via Caregility video, sending text request x3 with no response.     Attempt 1: Text: 1307   Attempt 2: Text: 1309    Attempt 3: Text: 1310   Disconnected video visit at:  1312  Per chart review, pt presented to [facility] this a.m. with complaints of nausea and vomiting, w/ suspected COVID-19 virus infection. Will review attendance concerns at next scheduled appt.    Per [facility] policy, after multiple attempts to reach patient unsuccessfully at appointed time, visit will be coded as a no show.  [name], MSW, LCSW 05/27/2024,  1:12 PM

## 2024-06-10 ENCOUNTER — Ambulatory Visit (INDEPENDENT_AMBULATORY_CARE_PROVIDER_SITE_OTHER): Payer: MEDICAID | Admitting: Licensed Clinical Social Worker

## 2024-06-10 DIAGNOSIS — F1210 Cannabis abuse, uncomplicated: Secondary | ICD-10-CM | POA: Diagnosis not present

## 2024-06-10 DIAGNOSIS — F411 Generalized anxiety disorder: Secondary | ICD-10-CM | POA: Diagnosis not present

## 2024-06-10 DIAGNOSIS — F331 Major depressive disorder, recurrent, moderate: Secondary | ICD-10-CM

## 2024-06-10 NOTE — Progress Notes (Unsigned)
 THERAPIST PROGRESS NOTE   Session Date: 06/10/2024  Session Time: 1308 - 1406  Participation Level: Active  MSE/Presentation: Behavior: Appropriate and Sharing Speech: Normal Thought Process: Coherent and Relevant Cognition: Alert and Appropriate Mood: Euthymic Affect: Congruent Insight: Appropriate and Good Appearance: Casual  Type of Therapy: Individual Therapy  Treatment Goals addressed:   Progressing (10)*** STG: Govani will explore personal core beliefs, rules and assumptions, and cognitive distortions through therapist using Cognitive Behavioral Therapy.   (OP Depression) LTG:  Provide psychoeducation on communication techniques such as I statements, open-ended questions, reflective listening, assertiveness, fair fighting rules, initiating conversations, and more as needed. (OP Depression) STG: Medhansh will process life events to the extent needed so that he is able to move forward with various areas of life in a better frame of mind per self-report   (OP Depression) LTG: Keylor will increase coping skills to promote long-term recovery and improve ability to perform daily activities (Substance Use) STG: Work to develop a marijuana cessation plan and timeline, including through use of a Building control surveyor Exercise and Motivational Interviewing.   (Substance Use) LTG: Score less than 5 on the GAD-7 as evidenced by intermittent administration of the questionnaire to determine progress in management of anxiety.   (Anxiety) STG: Rogerick will reduce frequency of avoidant behaviors by 50% as evidenced by self-report in therapy sessions (Anxiety) LTG: Work to Arts development officer from models like CBT, Stages of Change, DBT, shame resilience theory, ACT, SFBT, MI, trauma-informed therapy and others to be able to manage mental health symptoms, AEB practicing out of session and reporting back.  . (Anxiety) STG: Learn about boundary types, how to implement them, and how to enforce  them so that Ardon feels more empowered and content with being able to maintain more helpful, appropriate boundaries in the future for a more balanced result.   (Anxiety) LTG: Learn about elements of a Relapse Prevention Plan and devise Edras's own plan covering basic elements of distractions, supports, consequences, and exit strategies.   (Substance Use)  Not Progressing (1) LTG: Denvil will improve quality of life by reducing consumption of mood-altering substances (Substance Use)  Completed/Met (2) STG: Score less than 9 on the Patient Health Questionnaire (PHQ-9) as evidenced by intermittent administration of the questionnaire to determine progress.   (OP Depression) LTG: Learn breathing techniques and grounding techniques at an age-appropriate level and demonstrate mastery in session then report independent use of these skills out of session.   (Anxiety)  Progress Towards Goals: Progressing  Interventions: CBT, Motivational Interviewing, Solution Focused, and Supportive  Summary: AIKAM VINJE is a 33 y.o. male with psych history of MDD, GAD, and Cannabis use disorder, presenting for follow-up therapy session in efforts to improve management of presenting symptoms.   Patient actively engaged in session, presenting in pleasant moods overall with minimal anxiousness, and congruent affect throughout duration of visit. Patient openly engaged in introductory check-in, sharing of having missed previous visit due to severe food poisoning. 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 Pt detailed of having  not had medication for the past 3.5 weeks due to financial factors,  expressing interest in potential continued discontinuation of medications, but requesting oversight from psychiatrist throughout process.  Actively processed factors contributing to discontinuing medication, expressing of having noticed loss of appetite but no other neg s/e such as feelings of worthlessness or depressive thoughts or feelings.  Actively processed potential factors contributing to decreased appetite to include less physical activity with job change, and further exploring reduction in [facility] use as potential contributing factors.  Actively engaged in reassessing presenting depressive and anxious symptoms via PHQ-9 and GAD-7, noting of continued downward trend and anxious symptoms and minor increase in depressive symptoms, solely attributed to variances in appetite and fatigue/tiredness.  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  Patient responded well to interventions. Patient continues to meet criteria for MDD, GAD, and Cannabis use disorder. Patient will continue to benefit from engagement in outpatient therapy due to being the least restrictive service to meet presenting needs.      06/10/2024    1:37 PM 05/13/2024    2:28 PM 04/29/2024   10:09 AM 08/14/2023    1:13 PM  GAD 7 : Generalized Anxiety Score  Nervous, Anxious, on Edge 2 1 2 1   Control/stop worrying 1 0 1 1  Worry too much - different things 1 0 1 1  Trouble relaxing 3 1 1 3   Restless 2 1 1 2   Easily annoyed or irritable 2 0 0 1  Afraid - awful might happen 0 0 0 1  Total GAD 7 Score 11 3 6 10   Anxiety Difficulty Very difficult Somewhat difficult Somewhat difficult Very difficult      06/10/2024    1:41 PM 05/13/2024    2:40 PM 04/29/2024   10:06 AM 08/14/2023    1:10 PM 12/19/2022    2:51 PM  Depression screen PHQ 2/9  Decreased Interest 2 1  1 3 1   Down, Depressed, Hopeless 3 1 0 2 1  PHQ - 2 Score 5 2 1 5 2   Altered sleeping 3 1 2 2 3   Tired, decreased energy 3 3 2 3 2   Change in appetite 1 2 0 1 1  Feeling bad or failure about yourself   1 1 2 1   Trouble concentrating 2 1 1 3 2   Moving slowly or fidgety/restless 1 0 0 1 0  Suicidal thoughts 3 0 0 1 1  PHQ-9 Score 18 10 7 18 12   Difficult doing work/chores Very difficult Very difficult Somewhat difficult Extremely dIfficult Somewhat difficult   Flowsheet Row Counselor from 06/10/2024 in [facility] from 11/11/2023 in [facility] (Discharged[facility]  C-SSRS RISK CATEGORY Low Risk No Risk Low Risk    Suicidal/Homicidal: None, No plan to harm self or others  Therapist Response: Clinician utilized CBT, MI, supportive reflections, and solution focused interventions to support patient in efforts to address presenting sxs and challenges surrounding presenting stressors.  Clinician actively greeted pt upon presenting for virtual visit, assessing presenting moods and affect. Openly engaged pt in introductory check-in, utilizing open ended questions and names of eliciting patient's recounts of events of recent weeks, presenting stressors, and individual means of managing such.  Utilized active listening techniques to provide support and validation and patient's recounts of events and identification of thoughts, feelings, and perspectives. Engaged in assessing presenting depressive  and anxious sxs via PHQ-9 and GAD-7, exploring variances in current and previous screening scores, and processing factors contributing to variances.  Utilized psycho Ed, CBT, MI, and solution focused techniques to aid patient in processing observed variances in appetite and self-elected discontinuation of medication.  Patient proves to be maintaining minimal  progress towards identified treatment goals.  Homework: N/A.  Plan: Return again in 2 weeks.  Diagnosis:  No diagnosis found.    Collaboration of Care: Psychiatrist AEB provider documentation available in EHR.  Patient/Guardian was advised Release of Information must be obtained prior to any record release in order to collaborate their care with an outside provider. Patient/Guardian was advised if they have not already done so to contact the registration department to sign all necessary forms in order for us  to release information regarding their care.   Consent: Patient/Guardian gives verbal consent for treatment and assignment of benefits for services provided during this visit. Patient/Guardian expressed understanding and agreed to proceed.   Virtual Visit via Video Note  I connected with Scott Gordon on 06/10/24 at  1:00 PM EDT by a video enabled telemedicine application and verified that I am speaking with the correct person using two identifiers.  Location: Patient: Home Provider: Home Office   I discussed the limitations of evaluation and management by telemedicine and the availability of in person appointments. The patient expressed understanding and agreed to proceed.   I discussed the assessment and treatment plan with the patient. The patient was provided an opportunity to ask questions and all were answered. The patient agreed with the plan and demonstrated an understanding of the instructions.   The patient was advised to call back or seek an in-person evaluation if the symptoms worsen or if the condition fails to improve as anticipated.  I provided 58 minutes of non-face-to-face time during this encounter.  [name], MSW, LCSW 06/10/2024,  1:46 PM

## 2024-06-24 ENCOUNTER — Ambulatory Visit (INDEPENDENT_AMBULATORY_CARE_PROVIDER_SITE_OTHER): Payer: MEDICAID | Admitting: Licensed Clinical Social Worker

## 2024-06-24 DIAGNOSIS — F411 Generalized anxiety disorder: Secondary | ICD-10-CM

## 2024-06-24 DIAGNOSIS — F1210 Cannabis abuse, uncomplicated: Secondary | ICD-10-CM | POA: Diagnosis not present

## 2024-06-24 DIAGNOSIS — F331 Major depressive disorder, recurrent, moderate: Secondary | ICD-10-CM | POA: Diagnosis not present

## 2024-06-24 NOTE — Progress Notes (Unsigned)
 THERAPIST PROGRESS NOTE   Session Date: 06/24/2024  Session Time: 1308 - 1409  Participation Level: Active  MSE/Presentation: Behavior: Appropriate and Sharing Speech: Normal Thought Process: Coherent and Relevant Cognition: Alert and Appropriate Mood: Euthymic Affect: Congruent Insight: Appropriate and Good Appearance: Casual  Type of Therapy: Individual Therapy  Treatment Goals addressed:   Progressing (10) STG: Scott Gordon will explore personal core beliefs, rules and assumptions, and cognitive distortions through therapist using Cognitive Behavioral Therapy.   (OP Depression) LTG:  Provide psychoeducation on communication techniques such as I statements, open-ended questions, reflective listening, assertiveness, fair fighting rules, initiating conversations, and more as needed. (OP Depression) STG: Scott Gordon will process life events to the extent needed so that he is able to move forward with various areas of life in a better frame of mind per self-report   (OP Depression) LTG: Scott Gordon will increase coping skills to promote long-term recovery and improve ability to perform daily activities (Substance Use) STG: Work to develop a marijuana cessation plan and timeline, including through use of a Building control surveyor Exercise and Motivational Interviewing.   (Substance Use) LTG: Score less than 5 on the GAD-7 as evidenced by intermittent administration of the questionnaire to determine progress in management of anxiety.   (Anxiety) STG: Scott Gordon will reduce frequency of avoidant behaviors by 50% as evidenced by self-report in therapy sessions (Anxiety) LTG: Work to Arts development officer from models like CBT, Stages of Change, DBT, shame resilience theory, ACT, SFBT, MI, trauma-informed therapy and others to be able to manage mental health symptoms, AEB practicing out of session and reporting back.  . (Anxiety) STG: Learn about boundary types, how to implement them, and how to enforce  them so that Scott Gordon feels more empowered and content with being able to maintain more helpful, appropriate boundaries in the future for a more balanced result.   (Anxiety) LTG: Learn about elements of a Relapse Prevention Plan and devise Scott Gordon's own plan covering basic elements of distractions, supports, consequences, and exit strategies.   (Substance Use)  Not Progressing (1) LTG: Scott Gordon will improve quality of life by reducing consumption of mood-altering substances (Substance Use)  Completed/Met (2) STG: Score less than 9 on the Patient Health Questionnaire (PHQ-9) as evidenced by intermittent administration of the questionnaire to determine progress.   (OP Depression) LTG: Learn breathing techniques and grounding techniques at an age-appropriate level and demonstrate mastery in session then report independent use of these skills out of session.   (Anxiety)  Progress Towards Goals: Progressing  Interventions: CBT, Motivational Interviewing, Solution Focused, and Supportive  Summary: Scott Gordon is a 33 y.o. male with psych history of MDD, GAD, and Cannabis use disorder, presenting for follow-up therapy session in efforts to improve management of presenting symptoms.   Patient actively engaged in session, presenting in pleasant moods overall, and congruent affect throughout duration of visit. Patient openly engaged in introductory check-in, sharing of Things are going okay, detailing of having been working and spending time with partners over recent weeks. 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 ***   List of things that support when in a bad mood, want to have ***     missed previous visit due to severe food poisoning. 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 Actively processed varying thoughts and feelings surrounding stressor, and processed the benefits of developing further lists to support in weighing the pros/cons/costs/benefits to each potential employment options. Pt additionally shared of having intentionally decreased caffeine intake by 50% last week, which immediately aided in sleep, proving uncertain as to whether caffeine reduction has had such significant impact on sleep, or whether increased depressive sxs experienced over the past 2-3 weeks has proven to impact sleep also. Reassessed presenting sxs observed over the past two weeks via PHQ-9 and GAD-7, further processing significant increases in both depressive and anxious sxs, further exploring pSI and safety. 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     06/10/2024    1:37 PM 05/13/2024    2:28 PM 04/29/2024   10:09 AM 08/14/2023    1:13 PM  GAD 7 : Generalized Anxiety Score  Nervous, Anxious, on Edge 2 1 2 1   Control/stop worrying 1 0 1 1  Worry too much - different things 1 0 1 1  Trouble relaxing 3 1 1 3   Restless 2 1 1 2   Easily annoyed or irritable 2 0 0 1  Afraid - awful might happen 0 0 0 1  Total GAD 7 Score 11 3 6 10   Anxiety Difficulty Very difficult Somewhat difficult Somewhat difficult Very difficult      06/10/2024    1:41 PM 05/13/2024    2:40 PM 04/29/2024   10:06 AM 08/14/2023    1:10 PM 12/19/2022    2:51 PM  Depression screen PHQ 2/9  Decreased Interest 2 1 1 3 1   Down, Depressed, Hopeless 3 1 0 2 1  PHQ - 2 Score 5 2 1 5 2   Altered sleeping 3 1 2 2 3   Tired, decreased  energy 3 3 2 3 2   Change in appetite 1 2 0 1 1  Feeling bad or failure about yourself   1 1 2 1   Trouble concentrating 2 1 1 3 2   Moving slowly or fidgety/restless 1 0 0 1 0  Suicidal thoughts 3 0 0 1 1  PHQ-9 Score 18 10 7 18 12   Difficult doing work/chores Very difficult Very difficult Somewhat difficult Extremely dIfficult Somewhat difficult   Flowsheet Row Counselor from 06/10/2024 in [facility] ED from 11/11/2023 in [facility] Department [facility] Admission (Discharged) from [facility]  C-SSRS RISK CATEGORY Low Risk No Risk Low Risk    Suicidal/Homicidal: None, No plan to harm self or others  Therapist Response:   Clinician actively greeted pt upon presenting for virtual visit, assessing presenting moods and affect. Openly engaged pt in introductory check-in, exploring daily events and moods, further utilizing open ended questions in eliciting patient's recounts of events of recent weeks, newly identified stressors, recurring stressors, observed implications on moods, and individual efforts at navigating challenges. Utilized active listening techniques to provide support and validation in patient's recounts of events and of identified thoughts, feelings, and perspectives surrounding new and ongoing stressors. Engaged in assessing presenting sxs via PHQ-9 and GAD-7, exploring significant increases in sxs, and processing factors contributing to variances.  Utilized psycho Ed, CBT, MI, and solution focused techniques to  aid patient in processing presenting sxs and challenges surrounding presenting stressors.   [x]  Cognitive Challenging []  Cognitive Refocusing [x]  Cognitive Reframing  []  Communication Skills []  Compliance Issues []  DBT []  Exploration of Coping Patterns [x]  Exploration of Emotions []  Exploration of Relationship Patterns []  Guided Imagery []  Interactive Feedback [x]   Interpersonal Resolutions []  Mindfulness Training []  Preventative Services []  Psycho-Education  []  Relaxation/Deep Breathing []  Review of Treatment Plan/Progress []  Role-Play/Behavioral Rehearsal  [x]  Structured Problem Solving [x]  Supportive Reflection []  Symptom Management  []  Other   Patient responded well to interventions. Patient continues to meet criteria for MDD, GAD, and Cannabis use disorder. Patient will continue to benefit from engagement in outpatient therapy due to being the least restrictive service to meet presenting needs. Patient proves to be maintaining minimal progress towards identified treatment goals.   Homework: N/A.  Plan: Return again in 2 weeks.  Diagnosis:  Encounter Diagnoses  Name Primary?   MDD (major depressive disorder), recurrent episode, moderate (HCC) Yes   GAD (generalized anxiety disorder)    Cannabis use disorder, mild, abuse        Collaboration of Care: Psychiatrist AEB provider documentation available in EHR.  Patient/Guardian was advised Release of Information must be obtained prior to any record release in order to collaborate their care with an outside provider. Patient/Guardian was advised if they have not already done so to contact the registration department to sign all necessary forms in order for us  to release information regarding their care.   Consent: Patient/Guardian gives verbal consent for treatment and assignment of benefits for services provided during this visit. Patient/Guardian expressed understanding and agreed to proceed.   Virtual Visit via Video Note  I connected with [name] on 06/24/24 at  1:00 PM EDT by a video enabled telemedicine application and verified that I am speaking with the correct person using two identifiers.  Location: Patient: Home Provider: Home Office   I discussed the limitations of evaluation and management by telemedicine and the availability of in person appointments. The patient  expressed understanding and agreed to proceed.   I discussed the assessment and treatment plan with the patient. The patient was provided an opportunity to ask questions and all were answered. The patient agreed with the plan and demonstrated an understanding of the instructions.   The patient was advised to call back or seek an in-person evaluation if the symptoms worsen or if the condition fails to improve as anticipated.  I provided 61 minutes of non-face-to-face time during this encounter.  [name], MSW, LCSW 06/24/2024,  1:10 PM

## 2024-07-08 ENCOUNTER — Ambulatory Visit (INDEPENDENT_AMBULATORY_CARE_PROVIDER_SITE_OTHER): Payer: MEDICAID | Admitting: Licensed Clinical Social Worker

## 2024-07-08 DIAGNOSIS — F411 Generalized anxiety disorder: Secondary | ICD-10-CM

## 2024-07-08 DIAGNOSIS — F1210 Cannabis abuse, uncomplicated: Secondary | ICD-10-CM | POA: Diagnosis not present

## 2024-07-08 DIAGNOSIS — F331 Major depressive disorder, recurrent, moderate: Secondary | ICD-10-CM | POA: Diagnosis not present

## 2024-07-08 NOTE — Progress Notes (Signed)
 THERAPIST PROGRESS NOTE   Session Date: 07/08/2024  Session Time: 0804 - 0854  Participation Level: Active  MSE/Presentation: Behavior: Appropriate and Sharing Speech: Normal Thought Process: Coherent and Relevant Cognition: Alert and Appropriate Mood: Euthymic Affect: Congruent Insight: Appropriate and Good Appearance: Casual  Type of Therapy: Individual Therapy  Treatment Goals addressed:   Progressing (10) STG: Bryor will explore personal core beliefs, rules and assumptions, and cognitive distortions through therapist using Cognitive Behavioral Therapy.   (OP Depression) LTG:  Provide psychoeducation on communication techniques such as I statements, open-ended questions, reflective listening, assertiveness, fair fighting rules, initiating conversations, and more as needed. (OP Depression) STG: Scott Gordon will process life events to the extent needed so that he is able to move forward with various areas of life in a better frame of mind per self-report   (OP Depression) LTG: Scott Gordon will increase coping skills to promote long-term recovery and improve ability to perform daily activities (Substance Use) STG: Work to develop a marijuana cessation plan and timeline, including through use of a Building Control Surveyor Exercise and Motivational Interviewing.   (Substance Use) LTG: Score less than 5 on the GAD-7 as evidenced by intermittent administration of the questionnaire to determine progress in management of anxiety.   (Anxiety) STG: Scott Gordon will reduce frequency of avoidant behaviors by 50% as evidenced by self-report in therapy sessions (Anxiety) LTG: Work to arts development officer from models like CBT, Stages of Change, DBT, shame resilience theory, ACT, SFBT, MI, trauma-informed therapy and others to be able to manage mental health symptoms, AEB practicing out of session and reporting back.  . (Anxiety) STG: Learn about boundary types, how to implement them, and how to enforce  them so that Scott Gordon feels more empowered and content with being able to maintain more helpful, appropriate boundaries in the future for a more balanced result.   (Anxiety) LTG: Learn about elements of a Relapse Prevention Plan and devise Osborn's own plan covering basic elements of distractions, supports, consequences, and exit strategies.   (Substance Use)  Not Progressing (1) LTG: Scott Gordon will improve quality of life by reducing consumption of mood-altering substances (Substance Use)  Completed/Met (2) STG: Score less than 9 on the Patient Health Questionnaire (PHQ-9) as evidenced by intermittent administration of the questionnaire to determine progress.   (OP Depression) LTG: Learn breathing techniques and grounding techniques at an age-appropriate level and demonstrate mastery in session then report independent use of these skills out of session.   (Anxiety)  Progress Towards Goals: Progressing  Interventions: CBT, Motivational Interviewing, Solution Focused, and Supportive  Summary: Scott Gordon is a 33 y.o. male with psych history of MDD, GAD, and Cannabis use disorder, presenting for follow-up therapy session in efforts to improve management of presenting symptoms.   Patient actively engaged in session, presenting in pleasant moods overall, and congruent and flat affect throughout duration of visit. Patient openly engaged in introductory check-in, sharing of doing well. Pt actively engaged in reassessing presenting depressive and anxious sxs via PHQ-9 and GAD-7, further processing noted significant reductions in sxs observed over the past two weeks, acknowledging to have found self to be in improved moods over recent weeks in comparison to previous weeks. Further processed variances in observed sxs, noting reduction in all sxs other than that of 1-2 areas. Pt engaged in exploration of recent increased THC use over recent weeks, further sharing of conflicting thoughts and feelings  surrounding use in relation to individual perspectives of improvements in moods and support with sleep, but negative  side effects of brain fog and difficulties focusing. Additionally explored pt's perspectives of THC uses impact on sleep and appetite, exploring brief duration of increased use proving to have no impact on sleep or appetite, and expressed intent to re-attempt discontinuation in use. Briefly explored benefits to journaling and supporting improvements in sleep.     07/08/2024    8:09 AM 06/10/2024    1:37 PM 05/13/2024    2:28 PM 04/29/2024   10:09 AM  GAD 7 : Generalized Anxiety Score  Nervous, Anxious, on Edge 1 2 1 2   Control/stop worrying 0 1 0 1  Worry too much - different things 1 1 0 1  Trouble relaxing 1 3 1 1   Restless 1 2 1 1   Easily annoyed or irritable 1 2 0 0  Afraid - awful might happen 0 0 0 0  Total GAD 7 Score 5 11 3 6   Anxiety Difficulty Somewhat difficult Very difficult Somewhat difficult Somewhat difficult      07/08/2024    8:19 AM 06/10/2024    1:41 PM 05/13/2024    2:40 PM 04/29/2024   10:06 AM 08/14/2023    1:10 PM  Depression screen PHQ 2/9  Decreased Interest 1 2 1 1 3   Down, Depressed, Hopeless 1 3 1  0 2  PHQ - 2 Score 2 5 2 1 5   Altered sleeping 1 3 1 2 2   Tired, decreased energy 2 3 3 2 3   Change in appetite 1 1 2  0 1  Feeling bad or failure about yourself  1  1 1 2   Trouble concentrating 1 2 1 1 3   Moving slowly or fidgety/restless 0 1 0 0 1  Suicidal thoughts 0 3 0 0 1  PHQ-9 Score 8 18 10 7 18   Difficult doing work/chores Somewhat difficult Very difficult Very difficult Somewhat difficult Extremely dIfficult   Flowsheet Row Counselor from 06/10/2024 in [facility] from 11/11/2023 in [facility]  C-SSRS RISK CATEGORY Low Risk No Risk Low Risk    Suicidal/Homicidal:  None, No plan to harm self or others  Therapist Response:  Clinician actively greeted pt upon presenting for virtual visit, engaging in introductory check-in, assessing presenting moods and affect. Further engaged pt utilizing open ended questions in eliciting recounts of events of recent weeks, newly identified/observed stressors, recurring stressors, noticeable implications on moods, and individual efforts at navigating challenges.  Utilized active listening techniques to provide support and validation of expressed thoughts, feelings, and perspectives surrounding ongoing stressors. Utilized psycho Ed, CBT, MI, and solution focused techniques to aid patient in processing presenting sxs and challenges surrounding presenting stressors and exploration of actionable steps pt may prove to take in order to address challenges.   [x]  Cognitive Challenging []  Cognitive Refocusing [x]  Cognitive Reframing  []  Communication Skills []  Compliance Issues []  DBT [x]  Exploration of Coping Patterns [x]  Exploration of Emotions []  Exploration of Relationship Patterns []  Guided Imagery []  Interactive Feedback [x]  Interpersonal Resolutions []  Mindfulness Training []  Preventative Services [x]  Psycho-Education  []  Relaxation/Deep Breathing []  Review of Treatment Plan/Progress []  Role-Play/Behavioral Rehearsal  [x]  Structured Problem Solving [x]  Supportive Reflection [x]  Symptom Management  []  Other   Patient responded well to interventions. Patient continues to meet criteria for MDD, GAD, and Cannabis use disorder. Patient will continue to benefit from engagement in outpatient therapy due to being the least restrictive service  to meet presenting needs. Patient proves to be maintaining minimal progress towards identified treatment goals.   Homework: Begin incorporating journaling into evening routine to aid in reduction of anxiousness and management of stress in efforts to support improved sleep.   Plan: Return again  in 2 weeks. Continue exploration of incorporation of journaling and explore specific format to reflect on days, sleep hygiene, and impact on appetite and moods.  Diagnosis:  Encounter Diagnoses  Name Primary?   MDD (major depressive disorder), recurrent episode, moderate (HCC) Yes   GAD (generalized anxiety disorder)    Cannabis use disorder, mild, abuse     Collaboration of Care: Psychiatrist AEB provider documentation available in EHR.  Patient/Guardian was advised Release of Information must be obtained prior to any record release in order to collaborate their care with an outside provider. Patient/Guardian was advised if they have not already done so to contact the registration department to sign all necessary forms in order for us  to release information regarding their care.   Consent: Patient/Guardian gives verbal consent for treatment and assignment of benefits for services provided during this visit. Patient/Guardian expressed understanding and agreed to proceed.   Virtual Visit via Video Note  I connected with Scott Gordon on 07/08/24 at  8:00 AM EST by a video enabled telemedicine application and verified that I am speaking with the correct person using two identifiers.  Location: Patient: Home Provider: Home Office   I discussed the limitations of evaluation and management by telemedicine and the availability of in person appointments. The patient expressed understanding and agreed to proceed.   I discussed the assessment and treatment plan with the patient. The patient was provided an opportunity to ask questions and all were answered. The patient agreed with the plan and demonstrated an understanding of the instructions.   The patient was advised to call back or seek an in-person evaluation if the symptoms worsen or if the condition fails to improve as anticipated.  I provided 50 minutes of non-face-to-face time during this encounter.  [name], MSW,  LCSW 07/08/2024,  8:45 AM
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
## 2024-07-15 ENCOUNTER — Other Ambulatory Visit (HOSPITAL_COMMUNITY): Payer: Self-pay

## 2024-07-15 ENCOUNTER — Ambulatory Visit (HOSPITAL_COMMUNITY): Payer: MEDICAID | Admitting: Psychiatry

## 2024-07-15 ENCOUNTER — Other Ambulatory Visit: Payer: Self-pay

## 2024-07-15 DIAGNOSIS — F411 Generalized anxiety disorder: Secondary | ICD-10-CM | POA: Diagnosis not present

## 2024-07-15 DIAGNOSIS — Z8659 Personal history of other mental and behavioral disorders: Secondary | ICD-10-CM

## 2024-07-15 DIAGNOSIS — F331 Major depressive disorder, recurrent, moderate: Secondary | ICD-10-CM

## 2024-07-15 DIAGNOSIS — F17203 Nicotine dependence unspecified, with withdrawal: Secondary | ICD-10-CM

## 2024-07-15 DIAGNOSIS — F1220 Cannabis dependence, uncomplicated: Secondary | ICD-10-CM

## 2024-07-15 DIAGNOSIS — F17200 Nicotine dependence, unspecified, uncomplicated: Secondary | ICD-10-CM

## 2024-07-15 MED ORDER — FLUOXETINE HCL 20 MG PO CAPS
20.0000 mg | ORAL_CAPSULE | Freq: Every day | ORAL | 0 refills | Status: AC
  Filled 2024-07-15: qty 60, 60d supply, fill #0

## 2024-07-15 MED ORDER — GABAPENTIN 100 MG PO CAPS
100.0000 mg | ORAL_CAPSULE | Freq: Two times a day (BID) | ORAL | 1 refills | Status: AC | PRN
  Filled 2024-07-15: qty 60, 30d supply, fill #0

## 2024-07-15 NOTE — Addendum Note (Signed)
 Addended by: CARVIN CROCK on: 07/15/2024 04:46 PM   Modules accepted: Level of Service

## 2024-07-18 LAB — TOXASSURE SELECT 13 (MW), URINE

## 2024-07-22 ENCOUNTER — Ambulatory Visit (HOSPITAL_COMMUNITY): Payer: MEDICAID | Admitting: Licensed Clinical Social Worker

## 2024-07-22 ENCOUNTER — Encounter (HOSPITAL_COMMUNITY): Payer: Self-pay

## 2024-07-22 NOTE — Progress Notes (Signed)
 THERAPIST PROGRESS NOTE   Session Date: 07/22/2024  Session Time: 0800  Scott Gordon    Clinician attempted to connect with patient for scheduled appointment via Caregility video, sending text request x3 with no response.     Attempt 1: Text: 0808   Attempt 2: Text: 0809    Attempt 3: Text: 0810   Disconnected video visit at:  9187    Per [place] policy, after multiple attempts to reach patient unsuccessfully at appointed time, visit will be coded as a no show.  Scott Gordon, MSW, LCSW 07/22/2024,  8:12 AM

## 2024-08-05 ENCOUNTER — Ambulatory Visit (HOSPITAL_COMMUNITY): Payer: MEDICAID | Admitting: Licensed Clinical Social Worker

## 2024-08-05 ENCOUNTER — Encounter (HOSPITAL_COMMUNITY): Payer: Self-pay | Admitting: Licensed Clinical Social Worker

## 2024-08-05 ENCOUNTER — Encounter (HOSPITAL_COMMUNITY): Payer: Self-pay

## 2024-08-05 NOTE — Progress Notes (Signed)
 THERAPIST PROGRESS NOTE   Session Date: 08/05/2024  Session Time: 1500  Scott Gordon    Clinician attempted to connect with patient for scheduled appointment via Caregility video, sending text request x3 with no response.     Attempt 1: Text: 1505   Attempt 2: Text: 1507   Attempt 3: Text: 1509   Disconnected video visit at:  1512    Per [place] policy, after multiple attempts to reach patient unsuccessfully at appointed time, visit will be coded as a no show.  Today's missed appt is pt's 3rd no-show in three months, and second consecutive no-show in the past two weeks. Due to lack of commitment to tx, pt will be discharged from practice and receive written notice.  [name], MSW, LCSW 08/05/2024,  3:13 PM
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
## 2024-08-12 ENCOUNTER — Telehealth (HOSPITAL_COMMUNITY): Payer: Self-pay | Admitting: Psychiatry

## 2024-08-12 ENCOUNTER — Ambulatory Visit (HOSPITAL_COMMUNITY): Payer: MEDICAID | Admitting: Psychiatry

## 2024-08-12 DIAGNOSIS — F17200 Nicotine dependence, unspecified, uncomplicated: Secondary | ICD-10-CM

## 2024-08-12 DIAGNOSIS — F1220 Cannabis dependence, uncomplicated: Secondary | ICD-10-CM

## 2024-08-12 DIAGNOSIS — F331 Major depressive disorder, recurrent, moderate: Secondary | ICD-10-CM

## 2024-08-12 DIAGNOSIS — F411 Generalized anxiety disorder: Secondary | ICD-10-CM

## 2024-08-12 NOTE — Telephone Encounter (Signed)
 Called patient regarding scheduled appointment today at 2pm at [phone], no answer  Left HIPAA compliant voicemail with front desk # [phone]) for patient to reschedule appointment

## 2024-08-19 ENCOUNTER — Ambulatory Visit (HOSPITAL_COMMUNITY): Payer: MEDICAID | Admitting: Licensed Clinical Social Worker

## 2024-09-20 ENCOUNTER — Ambulatory Visit (HOSPITAL_COMMUNITY)
Admission: EM | Admit: 2024-09-20 | Discharge: 2024-09-20 | Disposition: A | Discharge status: Home / Self Care | Attending: Nurse Practitioner | Admitting: Nurse Practitioner

## 2024-09-20 DIAGNOSIS — E8889 Other specified metabolic disorders: Secondary | ICD-10-CM | POA: Insufficient documentation

## 2024-09-20 DIAGNOSIS — F1220 Cannabis dependence, uncomplicated: Secondary | ICD-10-CM | POA: Insufficient documentation

## 2024-09-20 DIAGNOSIS — E860 Dehydration: Secondary | ICD-10-CM | POA: Insufficient documentation

## 2024-09-20 DIAGNOSIS — R1116 Cannabis hyperemesis syndrome: Secondary | ICD-10-CM | POA: Diagnosis present

## 2024-09-20 LAB — POCT URINALYSIS DIP (MANUAL ENTRY)
Bilirubin, UA: NEGATIVE
Blood, UA: NEGATIVE
Glucose, UA: NEGATIVE mg/dL
Leukocytes, UA: NEGATIVE
Nitrite, UA: NEGATIVE
Protein Ur, POC: NEGATIVE mg/dL
Spec Grav, UA: 1.02
Urobilinogen, UA: 0.2 U/dL
pH, UA: 7.5

## 2024-09-20 LAB — COMPREHENSIVE METABOLIC PANEL WITH GFR
ALT: 40 U/L (ref 0–44)
AST: 26 U/L (ref 15–41)
Albumin: 4.5 g/dL (ref 3.5–5.0)
Alkaline Phosphatase: 76 U/L (ref 38–126)
Anion gap: 12 (ref 5–15)
BUN: 17 mg/dL (ref 6–20)
CO2: 25 mmol/L (ref 22–32)
Calcium: 9.5 mg/dL (ref 8.9–10.3)
Chloride: 103 mmol/L (ref 98–111)
Creatinine, Ser: 0.95 mg/dL (ref 0.61–1.24)
GFR, Estimated: >60 mL/min
Glucose, Bld: 81 mg/dL (ref 70–99)
Potassium: 3.8 mmol/L (ref 3.5–5.1)
Sodium: 140 mmol/L (ref 135–145)
Total Bilirubin: 0.6 mg/dL (ref 0.0–1.2)
Total Protein: 7.7 g/dL (ref 6.5–8.1)

## 2024-09-20 MED ORDER — ONDANSETRON 4 MG PO TBDP
ORAL_TABLET | ORAL | Status: AC
  Filled 2024-09-20: qty 1

## 2024-09-20 MED ORDER — ONDANSETRON 4 MG PO TBDP
4.0000 mg | ORAL_TABLET | Freq: Once | ORAL | Status: AC
  Administered 2024-09-20: 4 mg via ORAL

## 2024-09-20 MED ORDER — SODIUM CHLORIDE 0.9 % IV BOLUS
1000.0000 mL | Freq: Once | INTRAVENOUS | Status: AC
  Administered 2024-09-20: 1000 mL via INTRAVENOUS

## 2024-09-20 MED ORDER — ONDANSETRON 8 MG PO TBDP: 8.0000 mg | ORAL_TABLET | Freq: Three times a day (TID) | ORAL | 0 refills | Status: AC | PRN

## 2024-09-20 MED ORDER — METOCLOPRAMIDE HCL 5 MG/ML IJ SOLN
INTRAMUSCULAR | Status: AC
  Filled 2024-09-20: qty 2

## 2024-09-20 MED ORDER — METOCLOPRAMIDE HCL 5 MG/ML IJ SOLN
10.0000 mg | Freq: Once | INTRAMUSCULAR | Status: AC
  Administered 2024-09-20: 10 mg via INTRAVENOUS

## 2024-09-20 NOTE — ED Notes (Signed)
 Patient given 240 cc of water. Patient drank 120 cc. Denies any N/V.

## 2024-09-20 NOTE — Discharge Instructions (Addendum)
 You were seen today for ongoing nausea and vomiting that has made it difficult to keep food and fluids down. Testing showed large ketones in your urine, which means your body was breaking down fat for energy because of poor intake. This is consistent with dehydration and starvation ketosis. There were no signs of infection, and your vital signs and blood work showed normal kidney function, electrolytes, and blood sugar. You received IV fluids and medication to help control nausea, and you were able to drink fluids before discharge.  At home, focus on slow and steady hydration.  For the next 24 hours, stick with clear liquids like water, Pedialyte, broth, or clear sodas. Take small sips of water or electrolyte drinks every few minutes rather than large amounts at once. Do not drink anything red, including red popsicles or red Jell-O, because it can make it hard to tell if you are vomiting blood. After 24 hours, you can start eating bland foods such as bananas, rice, applesauce, and toast. Avoid milk and dairy products, as well as fried, greasy, or spicy foods, until you are feeling better. You can slowly return to your normal diet as your symptoms improve. Take the prescribed nausea medication as directed and rest as needed. Because marijuana use may be contributing to your symptoms, it is important to stop using cannabis, as continued use can worsen or prolong vomiting.  Go to the emergency department right away if you are unable to keep fluids down, continue vomiting despite medication, develop severe or worsening abdominal pain, vomit blood or material that looks like coffee grounds, have very little or no urine, feel dizzy or faint, develop fever, confusion, chest pain, or any other new or concerning symptoms.

## 2024-09-20 NOTE — ED Triage Notes (Signed)
 Patient reports that he has had N/V approx 5 days out of 2 weeks and when he does vomit it is several times a day. Patient denies abdominal pain.  Patient states he has vomited approx 3 times today and twice yesterday.  Patient states he has been living off of Ensure for the past 2 weeks.  Patient states he has been taking Zofran  for his symptoms and had 2 days ago.

## 2024-09-20 NOTE — ED Provider Notes (Signed)
 " [facility]    CSN: [id number] Arrival date & time: 09/20/24  1331      History   Chief Complaint Chief Complaint  Patient presents with   Emesis    HPI Scott Gordon is a 34 y.o. male.   Discussed the use of AI scribe software for clinical note transcription with the patient, who gave verbal consent to proceed.   Constant presents with intermittent nausea and vomiting that has been ongoing for approximately two weeks. At symptom onset, he was able to tolerate two meals per day; however, over the past several days his intake has significantly declined, with only two full meals total and reliance primarily on protein shakes and Ensure. He reports that he is now unable to keep down even protein shakes or Ensure. Earlier today, he vomited and experienced a brief, stabbing pain on the left side immediately afterward. This pain lasted only a few seconds, resolved spontaneously, and has not recurred. He denies any prior similar pain.  He denies identifiable triggers such as recent restaurant or fast-food meals, foreign travel, recent antibiotic use, or exposure to individuals with similar symptoms. He denies diarrhea, dysuria, hematuria, or changes in urinary frequency, and reports his urine is yellow in color. He denies hematemesis, melena, or blood in the stool. He reports a temperature of 99.43F last week but denies current fever, chills, or body aches. He denies significant upper respiratory symptoms. He endorses intermittent dizziness, which he attributes to poor oral intake.  The patient reports a known dental cavity present for approximately two years, nearly to the nerve, which causes intermittent pain but has not been associated with swelling or acute symptoms and is not currently bothersome. He describes a history of anxiety and an anxious stomach, but states that this episode feels different due to its progressive worsening and current inability to tolerate oral  intake.  Social history is notable for cigarette smoking and marijuana use approximately two to three times per week, with a reported increase in marijuana use over the past two to three weeks as his symptoms have worsened and affected his mental health. Past medical history includes an episode of hematuria with severe pain approximately six months ago that resolved and was evaluated by urology without a definitive diagnosis, as well as a hospitalization four years ago for exercise-induced rhabdomyolysis requiring a seven-day inpatient stay.  The following sections of the patient's history were reviewed and updated as appropriate: allergies, current medications, past family history, past medical history, past social history, past surgical history, and problem list.      Past Medical History:  Diagnosis Date   ADHD, predominantly inattentive type    Anxiety    Depression    Nicotine  dependence     Patient Active Problem List   Diagnosis Date Noted   Tobacco use disorder 03/13/2024   Gross hematuria 11/16/2023   Major depressive disorder, recurrent episode, moderate (HCC) 11/05/2023   Major depressive disorder 11/05/2023   Erectile dysfunction 09/21/2022   Marijuana dependence (HCC) 05/03/2021   Substance induced mood disorder (HCC) 05/03/2021   Nicotine  dependence with withdrawal 05/03/2021   BMI 20.0-20.9, adult 03/24/2021   GAD (generalized anxiety disorder) 06/03/2020   Persistent depressive disorder 06/03/2020    Past Surgical History:  Procedure Laterality Date   WISDOM TOOTH EXTRACTION         Home Medications    Prior to Admission medications  Medication Sig Start Date End Date Taking? Authorizing Provider  ondansetron  (ZOFRAN -ODT) 8  MG disintegrating tablet Take 1 tablet (8 mg total) by mouth every 8 (eight) hours as needed for nausea or vomiting. 09/20/24  Yes Iola Lukes, FNP  FLUoxetine  (PROZAC ) 20 MG capsule Take 1 capsule (20 mg total) by mouth daily.  07/15/24   Chien, Stephanie, MD  gabapentin  (NEURONTIN ) 100 MG capsule Take 1 capsule (100 mg total) by mouth 2 (two) times daily as needed (for increased anxiety). 07/15/24   Chien, Stephanie, MD  nicotine  (NICODERM CQ  - DOSED IN MG/24 HOURS) 14 mg/24hr patch Place 1 patch (14 mg total) onto the skin daily. 11/09/23   Lenard Calin, MD  nicotine  polacrilex (NICORETTE ) 4 MG gum Take 1 each (4 mg total) by mouth as needed for smoking cessation. 03/13/24   Graham Krabbe, MD    Family History Family History  Problem Relation Age of Onset   Diabetes Mother    Non-Hodgkin's lymphoma Mother    Hypertension Father    Diabetes Father    Bone cancer Paternal Aunt     Social History Social History[1]   Allergies   Pineapple   Review of Systems Review of Systems  Constitutional:  Positive for appetite change, chills and fever (low grade 99.3 last week but none recently).  Gastrointestinal:  Positive for abdominal pain, nausea and vomiting. Negative for blood in stool and diarrhea.  Genitourinary:  Negative for decreased urine volume, dysuria and hematuria.  Neurological:  Positive for dizziness. Negative for headaches.  All other systems reviewed and are negative.    Physical Exam Triage Vital Signs ED Triage Vitals [09/20/24 1513]  Encounter Vitals Group     BP 132/85     Girls Systolic BP Percentile      Girls Diastolic BP Percentile      Boys Systolic BP Percentile      Boys Diastolic BP Percentile      Pulse Rate 94     Resp 16     Temp 98.5 F (36.9 C)     Temp Source Oral     SpO2 98 %     Weight      Height      Head Circumference      Peak Flow      Pain Score 0     Pain Loc      Pain Education      Exclude from Growth Chart    No data found.  Updated Vital Signs BP 132/85 (BP Location: Left Arm)   Pulse 94   Temp 98.5 F (36.9 C) (Oral)   Resp 16   SpO2 98%   Visual Acuity Right Eye Distance:   Left Eye Distance:   Bilateral Distance:    Right  Eye Near:   Left Eye Near:    Bilateral Near:     Physical Exam Vitals reviewed.  Constitutional:      General: He is awake. He is not in acute distress.    Appearance: Normal appearance. He is well-developed. He is not ill-appearing, toxic-appearing or diaphoretic.  HENT:     Head: Normocephalic.     Right Ear: Hearing normal.     Left Ear: Hearing normal.     Nose: Nose normal.     Mouth/Throat:     Mouth: Mucous membranes are moist.  Eyes:     General: Vision grossly intact.     Conjunctiva/sclera: Conjunctivae normal.  Cardiovascular:     Rate and Rhythm: Normal rate and regular rhythm.     Heart sounds: Normal  heart sounds.  Pulmonary:     Effort: Pulmonary effort is normal.     Breath sounds: Normal breath sounds and air entry.  Abdominal:     General: Bowel sounds are normal. There is no distension.     Palpations: Abdomen is soft.     Tenderness: There is no abdominal tenderness.  Musculoskeletal:        General: Normal range of motion.     Cervical back: Normal range of motion and neck supple.  Skin:    General: Skin is warm and dry.  Neurological:     General: No focal deficit present.     Mental Status: He is alert and oriented to person, place, and time.  Psychiatric:        Speech: Speech normal.        Behavior: Behavior is cooperative.      UC Treatments / Results  Labs (all labs ordered are listed, but only abnormal results are displayed) Labs Reviewed  POCT URINALYSIS DIP (MANUAL ENTRY) - Abnormal; Notable for the following components:      Result Value   Clarity, UA cloudy (*)    Ketones, POC UA large (80) (*)    All other components within normal limits  COMPREHENSIVE METABOLIC PANEL WITH GFR    EKG   Radiology No results found.  Procedures Procedures (including critical care time)  Medications Ordered in UC Medications  ondansetron  (ZOFRAN -ODT) disintegrating tablet 4 mg (4 mg Oral Given 09/20/24 1526)  sodium chloride  0.9 % bolus  1,000 mL (0 mLs Intravenous Stopped 09/20/24 1841)  metoCLOPramide  (REGLAN ) injection 10 mg (10 mg Intravenous Given 09/20/24 1802)    Initial Impression / Assessment and Plan / UC Course  I have reviewed the triage vital signs and the nursing notes.  Pertinent labs & imaging results that were available during my care of the patient were reviewed by me and considered in my medical decision making (see chart for details).     The patient presents with two weeks of progressive nausea and vomiting with worsening oral intolerance, initially able to eat two meals daily but now unable to tolerate protein shakes or Ensure. Urinalysis is notable for large ketones with yellow, slightly cloudy urine and is otherwise unremarkable, with no protein, glucose, bilirubin, or evidence of infection. Findings are consistent with dehydration and starvation ketosis secondary to prolonged decreased oral intake. Vital signs are stable, with no fever, hypotension, or tachycardia. There is no evidence of an infectious etiology given the duration of symptoms, absence of fever, and lack of recent travel, antibiotic use, or sick contacts.  The patient was treated with intravenous fluid resuscitation and Reglan  with improvement in symptoms. A comprehensive metabolic panel was obtained to evaluate electrolytes, renal function, and glucose given the degree of dehydration and ketonuria, and results were within normal limits, including kidney and liver function, sodium, potassium, and glucose levels. The patient successfully completed an oral challenge and was able to tolerate oral fluids. Given clinical improvement and reassuring laboratory results, discharge home is appropriate. The patient was advised to follow up with a primary care provider for ongoing evaluation and management and to return for emergency care if unable to maintain oral intake, develops worsening or new symptoms such as abdominal pain, hematemesis, decreased  urination, or signs of kidney dysfunction or electrolyte abnormalities.  Cannabis hyperemesis syndrome remains on the differential given marijuana use two to three times weekly with increased use over the past two to three weeks coinciding with  symptom onset. The patient reports using cannabis for mental health symptom management as nausea worsened. The patient was counseled to discontinue marijuana use to assess for symptom resolution and to monitor response following cessation.  Today's evaluation has revealed no signs of a dangerous process. Discussed diagnosis with patient and/or guardian. Patient and/or guardian aware of their diagnosis, possible red flag symptoms to watch out for and need for close follow up. Patient and/or guardian understands verbal and written discharge instructions. Patient and/or guardian comfortable with plan and disposition.  Patient and/or guardian has a clear mental status at this time, good insight into illness (after discussion and teaching) and has clear judgment to make decisions regarding their care  Documentation was completed with the aid of voice recognition software. Transcription may contain typographical errors.  Final Clinical Impressions(s) / UC Diagnoses   Final diagnoses:  Dehydration, moderate  Cannabinoid hyperemesis syndrome  Marijuana dependence (HCC)  Ketosis (HCC)     Discharge Instructions      You were seen today for ongoing nausea and vomiting that has made it difficult to keep food and fluids down. Testing showed large ketones in your urine, which means your body was breaking down fat for energy because of poor intake. This is consistent with dehydration and starvation ketosis. There were no signs of infection, and your vital signs and blood work showed normal kidney function, electrolytes, and blood sugar. You received IV fluids and medication to help control nausea, and you were able to drink fluids before discharge.  At home, focus on  slow and steady hydration.  For the next 24 hours, stick with clear liquids like water, Pedialyte, broth, or clear sodas. Take small sips of water or electrolyte drinks every few minutes rather than large amounts at once. Do not drink anything red, including red popsicles or red Jell-O, because it can make it hard to tell if you are vomiting blood. After 24 hours, you can start eating bland foods such as bananas, rice, applesauce, and toast. Avoid milk and dairy products, as well as fried, greasy, or spicy foods, until you are feeling better. You can slowly return to your normal diet as your symptoms improve. Take the prescribed nausea medication as directed and rest as needed. Because marijuana use may be contributing to your symptoms, it is important to stop using cannabis, as continued use can worsen or prolong vomiting.  Go to the emergency department right away if you are unable to keep fluids down, continue vomiting despite medication, develop severe or worsening abdominal pain, vomit blood or material that looks like coffee grounds, have very little or no urine, feel dizzy or faint, develop fever, confusion, chest pain, or any other new or concerning symptoms.     ED Prescriptions     Medication Sig Dispense Auth. Provider   ondansetron  (ZOFRAN -ODT) 8 MG disintegrating tablet Take 1 tablet (8 mg total) by mouth every 8 (eight) hours as needed for nausea or vomiting. 12 tablet Iola Lukes, FNP      PDMP not reviewed this encounter.      [1]  Social History Tobacco Use   Smoking status: Every Day    Current packs/day: 0.25    Average packs/day: 0.3 packs/day for 3.0 years (0.8 ttl pk-yrs)    Types: Cigarettes   Smokeless tobacco: Never   Tobacco comments:    Previously used vapes, switched to cigarettes about 3 years ago due to cost.  Vaping Use   Vaping status: Former   Substances: Nicotine ,  Flavoring  Substance Use Topics   Alcohol use: Yes    Comment: social alcohol  use   Drug use: Yes    Types: Marijuana     Iola Lukes, FNP 09/20/24 1938  "

## 2024-09-21 ENCOUNTER — Emergency Department (HOSPITAL_COMMUNITY)

## 2024-09-21 ENCOUNTER — Emergency Department (HOSPITAL_COMMUNITY)
Admission: EM | Admit: 2024-09-21 | Discharge: 2024-09-21 | Disposition: A | Discharge status: Home / Self Care | Attending: Emergency Medicine | Admitting: Emergency Medicine

## 2024-09-21 DIAGNOSIS — E860 Dehydration: Secondary | ICD-10-CM | POA: Diagnosis not present

## 2024-09-21 DIAGNOSIS — R110 Nausea: Secondary | ICD-10-CM

## 2024-09-21 DIAGNOSIS — R112 Nausea with vomiting, unspecified: Secondary | ICD-10-CM | POA: Diagnosis present

## 2024-09-21 LAB — CBC WITH DIFFERENTIAL/PLATELET
Abs Immature Granulocytes: 0.02 K/uL (ref 0.00–0.07)
Basophils Absolute: 0 K/uL (ref 0.0–0.1)
Basophils Relative: 0 %
Eosinophils Absolute: 0 K/uL (ref 0.0–0.5)
Eosinophils Relative: 0 %
HCT: 48 % (ref 39.0–52.0)
Hemoglobin: 16.1 g/dL (ref 13.0–17.0)
Immature Granulocytes: 0 %
Lymphocytes Relative: 16 %
Lymphs Abs: 1.5 K/uL (ref 0.7–4.0)
MCH: 28.5 pg (ref 26.0–34.0)
MCHC: 33.5 g/dL (ref 30.0–36.0)
MCV: 85 fL (ref 80.0–100.0)
Monocytes Absolute: 0.6 K/uL (ref 0.1–1.0)
Monocytes Relative: 6 %
Neutro Abs: 7.3 K/uL (ref 1.7–7.7)
Neutrophils Relative %: 78 %
Platelets: 255 K/uL (ref 150–400)
RBC: 5.65 MIL/uL (ref 4.22–5.81)
RDW: 13.7 % (ref 11.5–15.5)
WBC: 9.4 K/uL (ref 4.0–10.5)
nRBC: 0 % (ref 0.0–0.2)

## 2024-09-21 LAB — COMPREHENSIVE METABOLIC PANEL WITH GFR
ALT: 32 U/L (ref 0–44)
AST: 26 U/L (ref 15–41)
Albumin: 4.6 g/dL (ref 3.5–5.0)
Alkaline Phosphatase: 75 U/L (ref 38–126)
Anion gap: 12 (ref 5–15)
BUN: 9 mg/dL (ref 6–20)
CO2: 23 mmol/L (ref 22–32)
Calcium: 9.5 mg/dL (ref 8.9–10.3)
Chloride: 105 mmol/L (ref 98–111)
Creatinine, Ser: 0.98 mg/dL (ref 0.61–1.24)
GFR, Estimated: >60 mL/min
Glucose, Bld: 102 mg/dL — ABNORMAL HIGH (ref 70–99)
Potassium: 3.5 mmol/L (ref 3.5–5.1)
Sodium: 139 mmol/L (ref 135–145)
Total Bilirubin: 0.9 mg/dL (ref 0.0–1.2)
Total Protein: 7.8 g/dL (ref 6.5–8.1)

## 2024-09-21 LAB — URINE DRUG SCREEN
Amphetamines: NEGATIVE
Barbiturates: NEGATIVE
Benzodiazepines: NEGATIVE
Cocaine: NEGATIVE
Fentanyl: NEGATIVE
Methadone Scn, Ur: NEGATIVE
Opiates: NEGATIVE
Tetrahydrocannabinol: POSITIVE — AB

## 2024-09-21 LAB — URINALYSIS, ROUTINE W REFLEX MICROSCOPIC
Bilirubin Urine: NEGATIVE
Glucose, UA: NEGATIVE mg/dL
Hgb urine dipstick: NEGATIVE
Ketones, ur: 20 mg/dL — AB
Leukocytes,Ua: NEGATIVE
Nitrite: NEGATIVE
Protein, ur: NEGATIVE mg/dL
Specific Gravity, Urine: 1.018 (ref 1.005–1.030)
pH: 7 (ref 5.0–8.0)

## 2024-09-21 LAB — CK: Total CK: 183 U/L (ref 49–397)

## 2024-09-21 LAB — LIPASE, BLOOD: Lipase: 26 U/L (ref 11–51)

## 2024-09-21 MED ORDER — METOCLOPRAMIDE HCL 10 MG PO TABS: 10.0000 mg | ORAL_TABLET | Freq: Four times a day (QID) | ORAL | 0 refills | Status: AC | PRN

## 2024-09-21 MED ORDER — LACTATED RINGERS IV BOLUS
1000.0000 mL | Freq: Once | INTRAVENOUS | Status: AC
  Administered 2024-09-21: 1000 mL via INTRAVENOUS

## 2024-09-21 MED ORDER — DROPERIDOL 2.5 MG/ML IJ SOLN
1.2500 mg | Freq: Once | INTRAMUSCULAR | Status: AC
  Administered 2024-09-21: 1.25 mg via INTRAVENOUS
  Filled 2024-09-21: qty 2

## 2024-09-21 MED ORDER — SODIUM CHLORIDE 0.9 % IV BOLUS
1000.0000 mL | Freq: Once | INTRAVENOUS | Status: AC
  Administered 2024-09-21: 1000 mL via INTRAVENOUS

## 2024-09-21 MED ORDER — IOHEXOL 300 MG/ML  SOLN
100.0000 mL | Freq: Once | INTRAMUSCULAR | Status: AC | PRN
  Administered 2024-09-21: 100 mL via INTRAVENOUS

## 2024-09-21 MED ORDER — PANTOPRAZOLE SODIUM 40 MG IV SOLR
40.0000 mg | Freq: Once | INTRAVENOUS | Status: AC
  Administered 2024-09-21: 40 mg via INTRAVENOUS
  Filled 2024-09-21: qty 10

## 2024-09-21 MED ORDER — DIPHENHYDRAMINE HCL 50 MG/ML IJ SOLN
12.5000 mg | Freq: Once | INTRAMUSCULAR | Status: AC
  Administered 2024-09-21: 12.5 mg via INTRAVENOUS
  Filled 2024-09-21: qty 1

## 2024-09-21 NOTE — Discharge Instructions (Addendum)
 You were seen in the ER today for evaluation of your symptoms. Sometimes, chronic nausea and vomiting can be related to long term marijuana use. Stop smoking marijuana for a prolonged time to see if this improved your symptoms. In the meantime, I have given you the information for a GI provider and a primary care provider. Please call to schedule an appointment with both of them. Make sure that you are staying well hydrated and try consuming small snacks throughout the day instead of larger meals. I have attached some additional information in the chart for you to review. I am prescribing you a different medication to take for your nausea/vomiting. Please follow up with your PCP on the incidental findings with your imaging. If you have any concerns, new or worsening symptoms, please return to the ER for re-evaluation.   Contact a doctor if: Your symptoms get worse. You have new symptoms. You have a fever. You cannot drink fluids without vomiting. You feel like you may vomit for more than 2 days. You feel light-headed or dizzy. You have a headache. You have muscle cramps. You have a rash. You have pain while peeing. Get help right away if: You have pain in your chest, neck, arm, or jaw. You feel very weak or you faint. You vomit again and again. You have vomit that is bright red or looks like black coffee grounds. You have bloody or black poop (stools) or poop that looks like tar. You have a very bad headache, a stiff neck, or both. You have very bad pain, cramping, or bloating in your belly (abdomen). You have trouble breathing. You are breathing very quickly. Your heart is beating very quickly. Your skin feels cold and clammy. You feel confused. You have signs of losing too much water in your body, such as: Dark pee, very little pee, or no pee. Cracked lips. Dry mouth. Sunken eyes. Sleepiness. Weakness. These symptoms may be an emergency. Get help right away. Call 911. Do not wait  to see if the symptoms will go away. Do not drive yourself to the hospital.

## 2024-09-21 NOTE — ED Provider Notes (Signed)
 " [facility] EMERGENCY DEPARTMENT AT [facility] Provider Note   CSN: [id number] Arrival date & time: 09/21/24  1714     Patient presents with: Nausea and Emesis   Scott Gordon is a 34 y.o. male with history of anxiety and depression presents emerged from today for evaluation of 2 weeks of intermittent nausea and vomiting.  Reports he was able to have some Ensure and protein shakes but now does not like he is able to keep things down.  Does Gatorade and chicken broth was able to keep this down.  Tried Zofran  without much relief.  Reports that he has had similar symptoms in years prior attributed to his anxiety and depression.  Denies any melena, hematochezia, coffee-ground emesis, and hematemesis.  Does not have any abdominal pain.  Last bowel movement this morning.  Not black or bloody.  Denies any dysuria, hematuria, fever, runny nose, nasal congestion.  Does not have any outpatient follow-up for this.  Does feel like he has been more stressed/anxious recently but does not have any thoughts of hurting himself or others.  Denies any chest pain, shortness breath, or difficulty swallowing.  Does smoke marijuana 4-5 times per week.  Denies any alcohol use.  No known drug allergies.  Emesis Associated symptoms: no abdominal pain, no chills, no diarrhea and no fever        Prior to Admission medications  Medication Sig Start Date End Date Taking? Authorizing Provider  FLUoxetine  (PROZAC ) 20 MG capsule Take 1 capsule (20 mg total) by mouth daily. 07/15/24   Chien, Stephanie, MD  gabapentin  (NEURONTIN ) 100 MG capsule Take 1 capsule (100 mg total) by mouth 2 (two) times daily as needed (for increased anxiety). 07/15/24   Chien, Stephanie, MD  nicotine  (NICODERM CQ  - DOSED IN MG/24 HOURS) 14 mg/24hr patch Place 1 patch (14 mg total) onto the skin daily. 11/09/23   Lenard Calin, MD  nicotine  polacrilex (NICORETTE ) 4 MG gum Take 1 each (4 mg total) by mouth as needed for smoking  cessation. 03/13/24   Chien, Stephanie, MD  ondansetron  (ZOFRAN -ODT) 8 MG disintegrating tablet Take 1 tablet (8 mg total) by mouth every 8 (eight) hours as needed for nausea or vomiting. 09/20/24   Iola Lukes, FNP    Allergies: Pineapple    Review of Systems  Constitutional:  Negative for chills and fever.  HENT:  Negative for trouble swallowing.   Respiratory:  Negative for shortness of breath.   Cardiovascular:  Negative for chest pain.  Gastrointestinal:  Positive for nausea and vomiting. Negative for abdominal pain, constipation and diarrhea.  Genitourinary:  Negative for dysuria and hematuria.  Psychiatric/Behavioral:  The patient is nervous/anxious.     Updated Vital Signs BP (!) 134/94 (BP Location: Right Arm)   Pulse 85   Temp 99.2 F (37.3 C) (Oral)   Resp 18   SpO2 99%   Physical Exam Vitals and nursing note reviewed.  Constitutional:      General: He is not in acute distress.    Appearance: He is not ill-appearing or toxic-appearing.  HENT:     Mouth/Throat:     Mouth: Mucous membranes are dry.  Eyes:     General: No scleral icterus. Cardiovascular:     Rate and Rhythm: Normal rate.  Pulmonary:     Effort: Pulmonary effort is normal. No respiratory distress.  Abdominal:     General: There is no distension.     Palpations: Abdomen is soft.  Tenderness: There is no abdominal tenderness. There is no guarding or rebound.  Skin:    General: Skin is warm and dry.  Neurological:     Mental Status: He is alert.     (all labs ordered are listed, but only abnormal results are displayed) Labs Reviewed  COMPREHENSIVE METABOLIC PANEL WITH GFR - Abnormal; Notable for the following components:      Result Value   Glucose, Bld 102 (*)    All other components within normal limits  URINALYSIS, ROUTINE W REFLEX MICROSCOPIC - Abnormal; Notable for the following components:   Ketones, ur 20 (*)    All other components within normal limits  URINE DRUG SCREEN -  Abnormal; Notable for the following components:   Tetrahydrocannabinol POSITIVE (*)    All other components within normal limits  CBC WITH DIFFERENTIAL/PLATELET  LIPASE, BLOOD  CK    EKG: None  Radiology: CT ABDOMEN PELVIS W CONTRAST Result Date: 09/21/2024 CLINICAL DATA:  Intermittent nausea and vomiting 2 weeks. EXAM: CT ABDOMEN AND PELVIS WITH CONTRAST TECHNIQUE: Multidetector CT imaging of the abdomen and pelvis was performed using the standard protocol following bolus administration of intravenous contrast. RADIATION DOSE REDUCTION: This exam was performed according to the departmental dose-optimization program which includes automated exposure control, adjustment of the mA and/or kV according to patient size and/or use of iterative reconstruction technique. CONTRAST:  OMNIPAQUE  IOHEXOL  300 MG/ML  SOLN COMPARISON:  11/24/2023 FINDINGS: Lower chest: The lung bases are clear of acute process. No pleural effusion or pulmonary lesions. The heart is normal in size. No pericardial effusion. The distal esophagus and aorta are unremarkable. Hepatobiliary: Stable small right hepatic lobe cyst. No worrisome hepatic lesions or intrahepatic biliary dilatation. The gallbladder is normal. No common bile duct dilatation. Pancreas: No mass, inflammation or ductal dilatation. Spleen: Normal size.  No focal lesions. Adrenals/Urinary Tract: The adrenal glands and kidneys are unremarkable. No renal lesions, renal calculi or hydronephrosis. The bladder is unremarkable. Stomach/Bowel: The stomach, duodenum, small and colon are grossly normal. No acute inflammatory process mass lesions obstructive findings. The appendix is normal. Vascular/Lymphatic: The aorta is normal in caliber. No dissection. The branch vessels are patent. The major venous structures are patent. No mesenteric or retroperitoneal mass or adenopathy. Small scattered lymph nodes are noted. Reproductive: Mild prostate gland enlargement for age. The  seminal vesicles are unremarkable. Other: No pelvic mass or adenopathy. No free pelvic fluid collections. No inguinal mass or adenopathy. No abdominal wall hernia or subcutaneous lesions. Musculoskeletal: No significant bony findings. IMPRESSION: 1. No acute abdominal/pelvic findings, mass lesions or adenopathy. 2. Mild prostate gland enlargement for age. Electronically Signed   By: MYRTIS Stammer M.D.   On: 09/21/2024 18:54   Procedures   Medications Ordered in the ED  lactated ringers  bolus 1,000 mL (0 mLs Intravenous Stopped 09/21/24 1843)  sodium chloride  0.9 % bolus 1,000 mL (0 mLs Intravenous Stopped 09/21/24 1843)  droperidol  (INAPSINE ) 2.5 MG/ML injection 1.25 mg (1.25 mg Intravenous Given 09/21/24 1846)  diphenhydrAMINE  (BENADRYL ) injection 12.5 mg (12.5 mg Intravenous Given 09/21/24 1844)  pantoprazole  (PROTONIX ) injection 40 mg (40 mg Intravenous Given 09/21/24 1847)  iohexol  (OMNIPAQUE ) 300 MG/ML solution 100 mL (100 mLs Intravenous Contrast Given 09/21/24 1816)      Medical Decision Making Amount and/or Complexity of Data Reviewed Labs: ordered. Radiology: ordered.  Risk Prescription drug management.   34 y.o. male presents to the ER for evaluation of nausea and vomiting. Differential diagnosis includes but is not limited to  ACS/MI, Boerhaave's, DKA, elevated ICP, Ischemic bowel, Sepsis, drug-related, Appendicitis, Bowel obstruction, Electrolyte abnormalities, Pancreatitis, Biliary colic, Gastroenteritis, Gastroparesis, Hepatitis, Migraine, Thyroid  disease, Renal colic, PUD, UTI. Vital signs mildly elevated BP otherwise unremarkable. Physical exam as noted above.   No chest pain, SOB, or trouble swallowing. The patient's abdomen is soft and nontender, but given persistence of nausea and vomiting symptoms, will order CT and labs. Fluids ordered. I have ordered the patient some droperidol  given MJ use, as cannabinoid induced hyperemesis is still on the differential.   I independently  reviewed and interpreted the patient's labs. UDS positive for MJ. Urinalysis shows 20 ketones otherwise unremarkable. CK within normal limits. Lipase and CBC within normal limits. CMP shows glucose at 102 otherwise no electrolyte or LFT abnormality.  CT abd shows 1. No acute abdominal/pelvic findings, mass lesions or adenopathy. 2. Mild prostate gland enlargement for age. Per radiologist's interpretation.    Patient has tolerated PO here. Feeling better after fluid and medications. Will discharge home with GI and PCP recommendations. He is aware of the mild prostate enlargement to follow up with PCP on this. Discussed MJ cessation and managing stress/anxiety.  This may have some component to his symptoms.  We discussed the results of the labs/imaging. The plan is supportive care, specialist follow up. We discussed strict return precautions and red flag symptoms. The patient verbalized their understanding and agrees to the plan. The patient is stable and being discharged home in good condition.  Portions of this report may have been transcribed using voice recognition software. Every effort was made to ensure accuracy; however, inadvertent computerized transcription errors may be present.    Final diagnoses:  Nausea  Dehydration    ED Discharge Orders          Ordered    metoCLOPramide  (REGLAN ) 10 MG tablet  Every 6 hours PRN        09/21/24 2045               Bernis Ernst, PA-C 09/23/24 0059    Fredia Rosette Kirsch, MD 10/05/24 1649  "

## 2024-09-21 NOTE — ED Triage Notes (Signed)
 N/V intermittently x 2 weeks, unsure what it is related to, went to UC yesterday and told his ketones were high and to come to ED if s/s progressed.

## 2024-09-24 ENCOUNTER — Encounter: Payer: Self-pay | Admitting: Gastroenterology

## 2024-10-09 ENCOUNTER — Encounter: Payer: Self-pay | Admitting: Urology

## 2024-10-09 ENCOUNTER — Ambulatory Visit: Admitting: Urology

## 2024-10-09 VITALS — BP 122/77 | HR 80 | Ht 67.0 in | Wt 145.0 lb

## 2024-10-09 DIAGNOSIS — R399 Unspecified symptoms and signs involving the genitourinary system: Secondary | ICD-10-CM

## 2024-10-09 DIAGNOSIS — Z87448 Personal history of other diseases of urinary system: Secondary | ICD-10-CM

## 2024-10-09 DIAGNOSIS — F524 Premature ejaculation: Secondary | ICD-10-CM

## 2024-10-09 DIAGNOSIS — Z87898 Personal history of other specified conditions: Secondary | ICD-10-CM

## 2024-10-09 DIAGNOSIS — N529 Male erectile dysfunction, unspecified: Secondary | ICD-10-CM | POA: Diagnosis not present

## 2024-10-09 LAB — MICROSCOPIC EXAMINATION: Bacteria, UA: NONE SEEN

## 2024-10-09 LAB — URINALYSIS, ROUTINE W REFLEX MICROSCOPIC
Bilirubin, UA: NEGATIVE
Glucose, UA: NEGATIVE
Leukocytes,UA: NEGATIVE
Nitrite, UA: NEGATIVE
Protein,UA: NEGATIVE
RBC, UA: NEGATIVE
Specific Gravity, UA: 1.025 (ref 1.005–1.030)
Urobilinogen, Ur: 0.2 mg/dL (ref 0.2–1.0)
pH, UA: 6 (ref 5.0–7.5)

## 2024-10-09 LAB — BLADDER SCAN AMB NON-IMAGING

## 2024-10-09 MED ORDER — TADALAFIL 5 MG PO TABS: 5.0000 mg | ORAL_TABLET | Freq: Every day | ORAL | 11 refills | Status: AC

## 2024-10-09 NOTE — Progress Notes (Signed)
 "  Assessment: 1. Lower urinary tract symptoms (LUTS)   2. History of gross hematuria   3. Erectile dysfunction, unspecified erectile dysfunction type   4. Premature ejaculation     Plan: I personally reviewed the patient's chart including provider notes, lab and imaging results. I reviewed the recent CT scan with results as noted below. Trial of tadalafil  5 mg daily for LUTS and ED.  Rx sent. AM labs:  testosterone, PSA, lipid panel, HbA1c Return to office in 6 weeks  Chief Complaint:  Chief Complaint  Patient presents with   Hematuria    History of Present Illness:  Scott Gordon is a 34 y.o. male who is seen for further evaluation of gross hematuria. He was initially seen in March 2025 for evaluation. He presented to ED on 11/11/23 with gross hematuria.   He had acute onset of gross hematuria with painful urination on the morning of 11/11/2023.  He reported normal urination prior to going to sleep that night.  He had gross hematuria which lasted for 24 hours.  No associated flank or back pain. U/A in the ED showed >50 RBCs, 6-10 WBCs, no bacteria.  No additional testing or imaging was performed. He had not had any further gross hematuria.  No history of kidney stones or UTIs.  No history of trauma. He does have a history of tobacco use smoking 1 pack/week.  CT hematuria study from 11/24/2023 showed no renal, ureteral, or bladder calculi, no hydronephrosis, no filling defects or renal masses.  He was recently seen in the emergency room for nausea. He was evaluated with CT of the abdomen and pelvis which showed no calculi, no renal masses, no obstruction, and mild prostate enlargement. Calculated prostate volume:  35 ml  He presents today for evaluation of lower urinary tract symptoms.  He reports a weak stream with frequency, urgency, and nocturia x 3.  No dysuria or gross hematuria. IPSS = 18/4. He also reports erectile dysfunction.  His symptoms been present for  approximately 1 year.  He reports decreased rigidity and rapid detumescence.  No pain or curvature with erection.  He also notes premature ejaculation.  Portions of the above documentation were copied from a prior visit for review purposes only.   Past Medical History:  Past Medical History:  Diagnosis Date   ADHD, predominantly inattentive type    Anxiety    Depression    Nicotine  dependence     Past Surgical History:  Past Surgical History:  Procedure Laterality Date   WISDOM TOOTH EXTRACTION      Allergies:  Allergies  Allergen Reactions   Pineapple Hives    Family History:  Family History  Problem Relation Age of Onset   Diabetes Mother    Non-Hodgkin's lymphoma Mother    Hypertension Father    Diabetes Father    Bone cancer Paternal Aunt     Social History:  Social History   Tobacco Use   Smoking status: Every Day    Current packs/day: 0.25    Average packs/day: 0.3 packs/day for 3.0 years (0.8 ttl pk-yrs)    Types: Cigarettes   Smokeless tobacco: Never   Tobacco comments:    Previously used vapes, switched to cigarettes about 3 years ago due to cost.  Vaping Use   Vaping status: Former   Substances: Nicotine , Flavoring  Substance Use Topics   Alcohol use: Yes    Comment: social alcohol use   Drug use: Yes    Types: Marijuana  ROS: Constitutional:  Negative for fever, chills, weight loss CV: Negative for chest pain, previous MI, hypertension Respiratory:  Negative for shortness of breath, wheezing, sleep apnea, frequent cough GI:  Negative for nausea, vomiting, bloody stool, GERD  Physical exam: BP 122/77   Pulse 80   Ht 5' 7 (1.702 m)   Wt 145 lb (65.8 kg)   BMI 22.71 kg/m  GENERAL APPEARANCE:  Well appearing, well developed, well nourished, NAD HEENT:  Atraumatic, normocephalic, oropharynx clear NECK:  Supple without lymphadenopathy or thyromegaly ABDOMEN:  Soft, non-tender, no masses EXTREMITIES:  Moves all extremities well, without  clubbing, cyanosis, or edema NEUROLOGIC:  Alert and oriented x 3, normal gait, CN II-XII grossly intact MENTAL STATUS:  appropriate BACK:  Non-tender to palpation, No CVAT SKIN:  Warm, dry, and intact GU: Prostate: 30 g, NT, no nodules Rectum: Normal tone,  no masses or tenderness   Results: Results for orders placed or performed in visit on 10/09/24 (from the past 24 hours)  Urinalysis, Routine w reflex microscopic     Status: Abnormal   Collection Time: 10/09/24 12:00 AM  Result Value Ref Range   Specific Gravity, UA 1.025 1.005 - 1.030   pH, UA 6.0 5.0 - 7.5   Color, UA Yellow Yellow   Appearance Ur Clear Clear   Leukocytes,UA Negative Negative   Protein,UA Negative Negative/Trace   Glucose, UA Negative Negative   Ketones, UA 4+ (A) Negative   RBC, UA Negative Negative   Bilirubin, UA Negative Negative   Urobilinogen, Ur 0.2 0.2 - 1.0 mg/dL   Nitrite, UA Negative Negative   Microscopic Examination See below:    Narrative   Performed at:  01 - [email or web address]  [facility] [address], [place], [place]  [id number] Lab Director: Greig Chang MT, Phone:  [phone]  Microscopic Examination     Status: None   Collection Time: 10/09/24 12:00 AM   Urine  Result Value Ref Range   WBC, UA 0-5 0 - 5 /hpf   RBC, Urine 0-2 0 - 2 /hpf   Epithelial Cells (non renal) 0-10 0 - 10 /hpf   Bacteria, UA None seen None seen/Few   Narrative   Performed at:  [address] [facility]  Urology [facility] [address], [place], [place]  [id number] Lab Director: Greig Chang MT, Phone:  [phone]    PVR = 0 ml.   "

## 2024-10-21 ENCOUNTER — Ambulatory Visit: Admitting: Gastroenterology

## 2024-10-21 ENCOUNTER — Other Ambulatory Visit

## 2024-12-02 ENCOUNTER — Ambulatory Visit: Admitting: Urology
